# Patient Record
Sex: Male | Born: 1975
Health system: Southern US, Community
[De-identification: ages and names within clinical notes are randomized; demographics above are authoritative.]

## PROBLEM LIST (undated history)

## (undated) DIAGNOSIS — F419 Anxiety disorder, unspecified: Secondary | ICD-10-CM

## (undated) DIAGNOSIS — F79 Unspecified intellectual disabilities: Secondary | ICD-10-CM

## (undated) DIAGNOSIS — F319 Bipolar disorder, unspecified: Secondary | ICD-10-CM

## (undated) DIAGNOSIS — F209 Schizophrenia, unspecified: Secondary | ICD-10-CM

---

## 2009-06-05 ENCOUNTER — Ambulatory Visit: Payer: Self-pay | Admitting: Family Medicine

## 2009-06-05 ENCOUNTER — Observation Stay (HOSPITAL_COMMUNITY): Admission: EM | Admit: 2009-06-05 | Discharge: 2009-06-06 | Payer: Self-pay | Admitting: Emergency Medicine

## 2010-04-20 LAB — COMPREHENSIVE METABOLIC PANEL
AST: 39 U/L — ABNORMAL HIGH (ref 0–37)
CO2: 25 mEq/L (ref 19–32)
Calcium: 8.1 mg/dL — ABNORMAL LOW (ref 8.4–10.5)
Creatinine, Ser: 1.13 mg/dL (ref 0.4–1.5)
GFR calc Af Amer: >60 mL/min (ref >60)
GFR calc non Af Amer: >60 mL/min (ref >60)
Sodium: 141 mEq/L (ref 135–145)
Total Protein: 5.8 g/dL — ABNORMAL LOW (ref 6.0–8.3)

## 2010-04-20 LAB — POCT I-STAT, CHEM 8
BUN: 13 mg/dL (ref 6–23)
Calcium, Ion: 1.09 mmol/L — ABNORMAL LOW (ref 1.12–1.32)
Creatinine, Ser: 1.2 mg/dL (ref 0.4–1.5)
Glucose, Bld: 100 mg/dL — ABNORMAL HIGH (ref 70–99)
Hemoglobin: 13.9 g/dL (ref 13.0–17.0)
TCO2: 26 mmol/L (ref 0–100)

## 2010-04-20 LAB — CBC
MCHC: 34.3 g/dL (ref 30.0–36.0)
MCV: 83.5 fL (ref 78.0–100.0)
Platelets: 161 10*3/uL (ref 150–400)
RDW: 13.1 % (ref 11.5–15.5)

## 2010-04-20 LAB — DIFFERENTIAL
Eosinophils Relative: 0 % (ref 0–5)
Lymphocytes Relative: 17 % (ref 12–46)
Lymphs Abs: 0.8 10*3/uL (ref 0.7–4.0)
Monocytes Relative: 13 % — ABNORMAL HIGH (ref 3–12)

## 2011-08-15 ENCOUNTER — Encounter (HOSPITAL_COMMUNITY): Payer: Self-pay | Admitting: *Deleted

## 2011-08-15 ENCOUNTER — Emergency Department (HOSPITAL_COMMUNITY)
Admission: EM | Admit: 2011-08-15 | Discharge: 2011-08-15 | Payer: Medicaid Other | Attending: Emergency Medicine | Admitting: Emergency Medicine

## 2011-08-15 DIAGNOSIS — F411 Generalized anxiety disorder: Secondary | ICD-10-CM | POA: Insufficient documentation

## 2011-08-15 DIAGNOSIS — Z79899 Other long term (current) drug therapy: Secondary | ICD-10-CM | POA: Insufficient documentation

## 2011-08-15 DIAGNOSIS — F319 Bipolar disorder, unspecified: Secondary | ICD-10-CM | POA: Insufficient documentation

## 2011-08-15 HISTORY — DX: Anxiety disorder, unspecified: F41.9

## 2011-08-15 HISTORY — DX: Unspecified intellectual disabilities: F79

## 2011-08-15 HISTORY — DX: Schizophrenia, unspecified: F20.9

## 2011-08-15 HISTORY — DX: Bipolar disorder, unspecified: F31.9

## 2011-08-15 MED ORDER — BENZTROPINE MESYLATE 1 MG/ML IJ SOLN
2.0000 mg | Freq: Once | INTRAMUSCULAR | Status: AC
  Administered 2011-08-15: 2 mg via INTRAMUSCULAR
  Filled 2011-08-15: qty 2

## 2011-08-15 MED ORDER — FLUPHENAZINE HCL 2.5 MG/ML IJ SOLN
5.0000 mg | Freq: Once | INTRAMUSCULAR | Status: AC
  Administered 2011-08-15: 5 mg via INTRAMUSCULAR
  Filled 2011-08-15: qty 2

## 2011-08-15 NOTE — ED Provider Notes (Signed)
History     CSN: [id number]  Arrival date & time 08/15/11  4098   First MD Initiated Contact with Patient 08/15/11 2022      Chief Complaint  Patient presents with  . Anxiety    (Consider location/radiation/quality/duration/timing/severity/associated sxs/prior treatment) HPI Comments: GALAN GHEE is a 36 y.o. Male who is here  because he is having episodes of shaking for several weeks. These occur every Monday, and Tuesday, but not at other times during the week. His caregiver feels that he is more anxious than usual. She wants him assessed by a psychiatrist or admited to a Behavioral Health facility. His psychiatrist is Dr. Ladona Ridgel at [facility]. He's been eating well. There's no recent fever, chills, nausea, vomiting, or noted change in bowel and urinary habits. He is reportedly taking all his medicines as prescribed. There are no exacerbating or palliative factors.  Patient is a 36 y.o. male presenting with anxiety. The history is provided by the patient.  Anxiety    Past Medical History  Diagnosis Date  . Bipolar 1 disorder   . Schizophrenia   . Mental retardation   . Anxiety     History reviewed. No pertinent past surgical history.  No family history on file.  History  Substance Use Topics  . Smoking status: Not on file  . Smokeless tobacco: Not on file  . Alcohol Use:       Review of Systems  All other systems reviewed and are negative.    Allergies  Review of patient's allergies indicates no known allergies.  Home Medications   Current Outpatient Rx  Name Route Sig Dispense Refill  . CLOZAPINE 100 MG PO TABS Oral Take 100 mg by mouth 2 (two) times daily.    Marland Kitchen FLUOXETINE HCL 20 MG PO CAPS Oral Take 20 mg by mouth daily.    Marland Kitchen FLUPHENAZINE HCL 5 MG PO TABS Oral Take 2.5 mg by mouth 2 (two) times daily.    . ADULT MULTIVITAMIN W/MINERALS CH Oral Take 1 tablet by mouth daily.    . STRESS FORMULA PLUS IRON PO Oral Take 1 tablet by mouth daily.    Marland Kitchen  RANITIDINE HCL 150 MG PO TABS Oral Take 150 mg by mouth at bedtime.      BP 117/67  Pulse 78  Temp 98.7 F (37.1 C) (Oral)  Resp 18  SpO2 100%  Physical Exam  Nursing note and vitals reviewed. Constitutional: He appears well-developed and well-nourished.  HENT:  Head: Normocephalic and atraumatic.  Right Ear: External ear normal.  Left Ear: External ear normal.  Eyes: Conjunctivae and EOM are normal. Pupils are equal, round, and reactive to light.  Neck: Normal range of motion and phonation normal. Neck supple.  Cardiovascular: Normal rate, regular rhythm, normal heart sounds and intact distal pulses.   Pulmonary/Chest: Effort normal and breath sounds normal. He exhibits no bony tenderness.  Abdominal: Soft. Normal appearance. There is no tenderness.  Musculoskeletal: Normal range of motion.  Neurological: He is alert. He has normal strength. No cranial nerve deficit or sensory deficit. He exhibits normal muscle tone. Coordination normal.       He is unable to answer questions about orientation beyond, his name.  Skin: Skin is warm, dry and intact.  Psychiatric: He has a normal mood and affect. His behavior is normal.    ED Course  Procedures (including critical care time)  Tele-psychiatric consultation done: Dr. Reece Leader recommends, that the patient be treated with Prolixin and Cogentin IM as  a single dose. He has been stable for discharge with followup with his regular psychiatrist.     Labs Reviewed  URINALYSIS, ROUTINE W REFLEX MICROSCOPIC   No results found.   1. Bipolar 1 disorder       MDM  Anxiety with ongoing schizophrenia. Patient treated and improved in emergency department. Doubt metabolic instability, serious bacterial infection or impending vascular collapse; the patient is stable for discharge.  Plan: Home Medications- usual; Home Treatments- rest; Recommended follow up- Psychiatry f/u asap for medication assessment        Flint Melter,  MD 08/15/11 2302

## 2011-08-15 NOTE — ED Notes (Signed)
MD at bedside. 

## 2011-08-15 NOTE — ED Notes (Signed)
Pt brought in by EMS; pt presented with panic attack; family called saying pt was having a seizure; on arrival pt in a "dazed" state since about 4pm.  Per EMS family states this is not normal. Pt alert and oriented x 4.  Per Child psychotherapist on scene this happens 3-4 times per wk.  Ambulated on arrival to ER

## 2011-08-15 NOTE — ED Notes (Addendum)
Director of Group Home, Ms. Roddie Mc with [facility], states that patient is not only anxious, but appears paranoid, "stares into space at times."  She feels as though he should be assessed for admission to a behavioral health/pscyh.  Director states the frequency has increased and was noted after change in patient's medication dosage for his schizophrenia. Currently patient appears anxious, noted to stare into space, denies any hallucinations or delusions at this time.

## 2011-08-15 NOTE — ED Notes (Signed)
Tele-psych call/fax. Tele-psych MD assessing pt

## 2011-08-15 NOTE — ED Notes (Signed)
[id number]<AV> Expected date:08/15/11<BR> Expected time: 7:11 PM<BR> Means of arrival:Ambulance<BR> Comments:<BR> Panic attack

## 2015-03-18 ENCOUNTER — Encounter (HOSPITAL_COMMUNITY): Payer: Self-pay | Admitting: *Deleted

## 2015-03-18 ENCOUNTER — Emergency Department (INDEPENDENT_AMBULATORY_CARE_PROVIDER_SITE_OTHER)
Admission: EM | Admit: 2015-03-18 | Discharge: 2015-03-18 | Disposition: A | Discharge status: Home / Self Care | Payer: Medicare Other | Source: Home / Self Care | Attending: Family Medicine | Admitting: Family Medicine

## 2015-03-18 DIAGNOSIS — K5901 Slow transit constipation: Secondary | ICD-10-CM | POA: Diagnosis not present

## 2015-03-18 DIAGNOSIS — F5089 Other specified eating disorder: Secondary | ICD-10-CM

## 2015-03-18 MED ORDER — ONDANSETRON 4 MG PO TBDP
ORAL_TABLET | ORAL | Status: AC
  Filled 2015-03-18: qty 1

## 2015-03-18 MED ORDER — ONDANSETRON 4 MG PO TBDP
4.0000 mg | ORAL_TABLET | Freq: Once | ORAL | Status: AC
  Administered 2015-03-18: 4 mg via ORAL

## 2015-03-18 MED ORDER — ONDANSETRON HCL 4 MG PO TABS: 4.0000 mg | ORAL_TABLET | Freq: Four times a day (QID) | ORAL | Status: DC

## 2015-03-18 MED ORDER — POLYETHYLENE GLYCOL 3350 17 G PO PACK
17.0000 g | PACK | Freq: Every day | ORAL | Status: DC
Start: 2015-03-18 — End: 2019-09-08

## 2015-03-18 NOTE — ED Notes (Signed)
Pt  Reports   Symptoms    Of     Nausea  Vomiting         And    Some  Constipation           Developed  Last  Pm        Pt  Resides  In a  Group home

## 2015-03-18 NOTE — ED Provider Notes (Signed)
CSN: [id number]     Arrival date & time 03/18/15  1317 History   First MD Initiated Contact with Patient 03/18/15 1433     Chief Complaint  Patient presents with  . Emesis   (Consider location/radiation/quality/duration/timing/severity/associated sxs/prior Treatment) Patient is a 40 y.o. male presenting with vomiting. The history is provided by the patient and a caregiver.  Emesis Severity:  Mild Duration:  6 hours Number of daily episodes:  1 Quality:  Stomach contents Progression:  Improving Chronicity:  New Relieved by:  None tried Worsened by:  Nothing tried Ineffective treatments:  None tried Associated symptoms: no abdominal pain and no diarrhea     Past Medical History  Diagnosis Date  . Bipolar 1 disorder (HCC)   . Schizophrenia (HCC)   . Mental retardation   . Anxiety    History reviewed. No pertinent past surgical history. History reviewed. No pertinent family history. Social History  Substance Use Topics  . Smoking status: None  . Smokeless tobacco: None  . Alcohol Use: None    Review of Systems  Constitutional: Positive for appetite change. Negative for fever.  Gastrointestinal: Positive for nausea, vomiting and constipation. Negative for abdominal pain, diarrhea and blood in stool.  All other systems reviewed and are negative.   Allergies  Review of patient's allergies indicates no known allergies.  Home Medications   Prior to Admission medications   Medication Sig Start Date End Date Taking? Authorizing Provider  cloZAPine (CLOZARIL) 100 MG tablet Take 100 mg by mouth 2 (two) times daily.    Historical Provider, MD  FLUoxetine (PROZAC) 20 MG capsule Take 20 mg by mouth daily.    Historical Provider, MD  fluPHENAZine (PROLIXIN) 5 MG tablet Take 2.5 mg by mouth 2 (two) times daily.    Historical Provider, MD  Multiple Vitamin (MULTIVITAMIN WITH MINERALS) TABS Take 1 tablet by mouth daily.    Historical Provider, MD  Multiple Vitamins-Iron (STRESS  FORMULA PLUS IRON PO) Take 1 tablet by mouth daily.    Historical Provider, MD  ondansetron (ZOFRAN) 4 MG tablet Take 1 tablet (4 mg total) by mouth every 6 (six) hours. Prn n/v. 03/18/15   Linna Hoff, MD  polyethylene glycol [facility] Ethelene Hal) packet Take 17 g by mouth daily. 03/18/15   Linna Hoff, MD  ranitidine (ZANTAC) 150 MG tablet Take 150 mg by mouth at bedtime.    Historical Provider, MD   Meds Ordered and Administered this Visit   Medications  ondansetron (ZOFRAN-ODT) disintegrating tablet 4 mg (not administered)    BP 117/69 mmHg  Pulse 114  Temp(Src) 99.7 F (37.6 C) (Oral)  Resp 16  SpO2 98% No data found.   Physical Exam  Constitutional: He appears well-developed and well-nourished. No distress.  Abdominal: Soft. Bowel sounds are normal. He exhibits no mass. There is no tenderness. There is no rebound and no guarding.  Neurological: He is alert.  Skin: Skin is warm and dry.  Nursing note and vitals reviewed.   ED Course  Procedures (including critical care time)  Labs Review Labs Reviewed - No data to display  Imaging Review No results found.   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 Slow transit constipation  Linna Hoff, MD 03/18/15 [phone]

## 2016-01-06 ENCOUNTER — Encounter (HOSPITAL_COMMUNITY): Payer: Self-pay | Admitting: *Deleted

## 2016-01-06 ENCOUNTER — Emergency Department (HOSPITAL_COMMUNITY)
Admission: EM | Admit: 2016-01-06 | Discharge: 2016-01-06 | Disposition: A | Discharge status: Home / Self Care | Payer: Medicare Other | Attending: Emergency Medicine | Admitting: Emergency Medicine

## 2016-01-06 DIAGNOSIS — R197 Diarrhea, unspecified: Secondary | ICD-10-CM | POA: Diagnosis not present

## 2016-01-06 DIAGNOSIS — R112 Nausea with vomiting, unspecified: Secondary | ICD-10-CM | POA: Diagnosis present

## 2016-01-06 DIAGNOSIS — E860 Dehydration: Secondary | ICD-10-CM | POA: Insufficient documentation

## 2016-01-06 LAB — COMPREHENSIVE METABOLIC PANEL
ALK PHOS: 80 U/L (ref 38–126)
ALT: 24 U/L (ref 17–63)
ANION GAP: 10 (ref 5–15)
AST: 28 U/L (ref 15–41)
Albumin: 4.7 g/dL (ref 3.5–5.0)
BUN: 18 mg/dL (ref 6–20)
CALCIUM: 10 mg/dL (ref 8.9–10.3)
CO2: 27 mmol/L (ref 22–32)
CREATININE: 2.32 mg/dL — AB (ref 0.61–1.24)
Chloride: 100 mmol/L — ABNORMAL LOW (ref 101–111)
GFR, EST AFRICAN AMERICAN: 39 mL/min — AB (ref >60)
GFR, EST NON AFRICAN AMERICAN: 34 mL/min — AB (ref >60)
Glucose, Bld: 129 mg/dL — ABNORMAL HIGH (ref 65–99)
Potassium: 4.1 mmol/L (ref 3.5–5.1)
SODIUM: 137 mmol/L (ref 135–145)
Total Bilirubin: 0.8 mg/dL (ref 0.3–1.2)
Total Protein: 8.4 g/dL — ABNORMAL HIGH (ref 6.5–8.1)

## 2016-01-06 LAB — URINALYSIS, ROUTINE W REFLEX MICROSCOPIC
Bilirubin Urine: NEGATIVE
GLUCOSE, UA: NEGATIVE mg/dL
HGB URINE DIPSTICK: NEGATIVE
KETONES UR: NEGATIVE mg/dL
LEUKOCYTES UA: NEGATIVE
NITRITE: NEGATIVE
Protein, ur: 100 mg/dL — AB
SPECIFIC GRAVITY, URINE: 1.025 (ref 1.005–1.030)
pH: 5 (ref 5.0–8.0)

## 2016-01-06 LAB — I-STAT CHEM 8, ED
BUN: 18 mg/dL (ref 6–20)
CALCIUM ION: 1.07 mmol/L — AB (ref 1.15–1.40)
CREATININE: 1.7 mg/dL — AB (ref 0.61–1.24)
Chloride: 103 mmol/L (ref 101–111)
GLUCOSE: 106 mg/dL — AB (ref 65–99)
HEMATOCRIT: 33 % — AB (ref 39.0–52.0)
HEMOGLOBIN: 11.2 g/dL — AB (ref 13.0–17.0)
Potassium: 3.7 mmol/L (ref 3.5–5.1)
Sodium: 142 mmol/L (ref 135–145)
TCO2: 26 mmol/L (ref 0–100)

## 2016-01-06 LAB — CBC
HCT: 43.1 % (ref 39.0–52.0)
HEMOGLOBIN: 14.7 g/dL (ref 13.0–17.0)
MCH: 28.4 pg (ref 26.0–34.0)
MCHC: 34.1 g/dL (ref 30.0–36.0)
MCV: 83.4 fL (ref 78.0–100.0)
PLATELETS: 213 10*3/uL (ref 150–400)
RBC: 5.17 MIL/uL (ref 4.22–5.81)
RDW: 13.4 % (ref 11.5–15.5)
WBC: 7.9 10*3/uL (ref 4.0–10.5)

## 2016-01-06 LAB — LIPASE, BLOOD: LIPASE: 13 U/L (ref 11–51)

## 2016-01-06 MED ORDER — ONDANSETRON 4 MG PO TBDP: 4.0000 mg | ORAL_TABLET | Freq: Three times a day (TID) | ORAL | 0 refills | Status: DC | PRN

## 2016-01-06 MED ORDER — SODIUM CHLORIDE 0.9 % IV BOLUS (SEPSIS)
1000.0000 mL | Freq: Once | INTRAVENOUS | Status: AC
  Administered 2016-01-06: 1000 mL via INTRAVENOUS

## 2016-01-06 MED ORDER — SODIUM CHLORIDE 0.9 % IV BOLUS (SEPSIS)
1000.0000 mL | Freq: Once | INTRAVENOUS | Status: AC
Start: 2016-01-06 — End: 2016-01-06
  Administered 2016-01-06: 1000 mL via INTRAVENOUS

## 2016-01-06 MED ORDER — ONDANSETRON HCL 4 MG/2ML IJ SOLN
4.0000 mg | Freq: Once | INTRAMUSCULAR | Status: AC
  Administered 2016-01-06: 4 mg via INTRAVENOUS
  Filled 2016-01-06: qty 2

## 2016-01-06 NOTE — ED Triage Notes (Signed)
Pt having n/v/d and feeling lightheaded since 0300. Denies having any pain. HR 133 at triage.

## 2016-01-06 NOTE — ED Notes (Signed)
Contact caregiver, Ms. Lewis, at [phone] (cell) or [phone]

## 2016-01-06 NOTE — ED Notes (Signed)
Contacted caregiver to inform pt will be discharged. Caregiver en route to transport pt home.

## 2016-01-06 NOTE — Discharge Instructions (Addendum)
Take the prescribed medication as directed.  Make sure to drink fluids to stay hydrated. Follow-up with your primary care doctor.  You will need your kidney function rechecked within the next few days. Return to the ED for new or worsening symptoms.

## 2016-01-06 NOTE — ED Notes (Signed)
Patient undressed, in gown, on continuous pulse oximetry and blood pressure cuff; visitor at bedside 

## 2016-01-06 NOTE — ED Notes (Signed)
Pt ambulatory w/ steady gait to restroom. 

## 2016-01-06 NOTE — ED Provider Notes (Signed)
[facility]-EMERGENCY DEPT Provider Note   CSN: [id number] Arrival date & time: 01/06/16  0857     History   Chief Complaint Chief Complaint  Patient presents with  . Emesis  . Dizziness    HPI Brian Stafford is a 40 y.o. male.  The history is provided by the patient and medical records.    40 year old male with history of anxiety, bipolar disorder, mental retardation, schizophrenia, presenting to the ED for nausea, vomiting, diarrhea. This began around 3 AM this morning.  Mother reports he was complaining of some nausea yesterday when he got home from his day program, but was able to eat dinner normally.  Mom reports he had a few episodes of watery, non-bloody, diarrhea when he woke up at 3am but went back to sleep for a little while.  He woke back up around 5am and started having Nonbloody, nonbilious emesis. No reported fever. Patient denies any abdominal pain. There is not been any sick contacts at home, mother is unsure if anyone at the day program has been ill. No prior abdominal surgeries. No significant GI history. No meds given PTA for symptoms.  Past Medical History:  Diagnosis Date  . Anxiety   . Bipolar 1 disorder (HCC)   . Mental retardation   . Schizophrenia (HCC)     There are no active problems to display for this patient.   History reviewed. No pertinent surgical history.     Home Medications    Prior to Admission medications   Medication Sig Start Date End Date Taking? Authorizing Provider  cloZAPine (CLOZARIL) 100 MG tablet Take 100 mg by mouth 2 (two) times daily.    Historical Provider, MD  FLUoxetine (PROZAC) 20 MG capsule Take 20 mg by mouth daily.    Historical Provider, MD  fluPHENAZine (PROLIXIN) 5 MG tablet Take 2.5 mg by mouth 2 (two) times daily.    Historical Provider, MD  Multiple Vitamin (MULTIVITAMIN WITH MINERALS) TABS Take 1 tablet by mouth daily.    Historical Provider, MD  Multiple Vitamins-Iron (STRESS FORMULA PLUS IRON PO) Take 1  tablet by mouth daily.    Historical Provider, MD  ondansetron (ZOFRAN) 4 MG tablet Take 1 tablet (4 mg total) by mouth every 6 (six) hours. Prn n/v. 03/18/15   Linna HoffJames D Kindl, MD  polyethylene glycol [facility] / Ethelene HalGLYCOLAX) packet Take 17 g by mouth daily. 03/18/15   Linna HoffJames D Kindl, MD  ranitidine (ZANTAC) 150 MG tablet Take 150 mg by mouth at bedtime.    Historical Provider, MD    Family History History reviewed. No pertinent family history.  Social History Social History  Substance Use Topics  . Smoking status: Not on file  . Smokeless tobacco: Not on file  . Alcohol use Not on file     Allergies   Patient has no known allergies.   Review of Systems Review of Systems  Gastrointestinal: Positive for diarrhea, nausea and vomiting.  All other systems reviewed and are negative.    Physical Exam Updated Vital Signs BP 128/76 (BP Location: Right Arm)   Pulse 117   Temp 98.3 F (36.8 C) (Oral)   Resp 18   SpO2 100%   Physical Exam  Constitutional: He is oriented to person, place, and time. He appears well-developed and well-nourished.  HENT:  Head: Normocephalic and atraumatic.  Mouth/Throat: Oropharynx is clear and moist.  Mildly dry mucous membranes  Eyes: Conjunctivae and EOM are normal. Pupils are equal, round, and reactive to light.  Neck: Normal range of motion.  Cardiovascular: Regular rhythm and normal heart sounds.  Tachycardia present.   Tachycardic around 115-120 during exam  Pulmonary/Chest: Effort normal and breath sounds normal. No respiratory distress. He has no wheezes.  Abdominal: Soft. Bowel sounds are normal. There is no tenderness.  Soft, benign, non-tender, normal bowel sounds, no distention  Musculoskeletal: Normal range of motion.  Neurological: He is alert and oriented to person, place, and time.  Skin: Skin is warm and dry.  Psychiatric: He has a normal mood and affect.  Nursing note and vitals reviewed.    ED Treatments / Results  Labs (all  labs ordered are listed, but only abnormal results are displayed) Labs Reviewed  COMPREHENSIVE METABOLIC PANEL - Abnormal; Notable for the following:       Result Value   Chloride 100 (*)    Glucose, Bld 129 (*)    Creatinine, Ser 2.32 (*)    Total Protein 8.4 (*)    GFR calc non Af Amer 34 (*)    GFR calc Af Amer 39 (*)    All other components within normal limits  URINALYSIS, ROUTINE W REFLEX MICROSCOPIC - Abnormal; Notable for the following:    Color, Urine AMBER (*)    APPearance HAZY (*)    Protein, ur 100 (*)    Bacteria, UA RARE (*)    Squamous Epithelial / LPF 0-5 (*)    All other components within normal limits  I-STAT CHEM 8, ED - Abnormal; Notable for the following:    Creatinine, Ser 1.70 (*)    Glucose, Bld 106 (*)    Calcium, Ion 1.07 (*)    Hemoglobin 11.2 (*)    HCT 33.0 (*)    All other components within normal limits  LIPASE, BLOOD  CBC    EKG  EKG Interpretation None       Radiology No results found.  Procedures Procedures (including critical care time)  Medications Ordered in ED Medications  sodium chloride 0.9 % bolus 1,000 mL (0 mLs Intravenous Stopped 01/06/16 1108)  ondansetron (ZOFRAN) injection 4 mg (4 mg Intravenous Given 01/06/16 0956)  sodium chloride 0.9 % bolus 1,000 mL (0 mLs Intravenous Stopped 01/06/16 1400)     Initial Impression / Assessment and Plan / ED Course  I have reviewed the triage vital signs and the nursing notes.  Pertinent labs & imaging results that were available during my care of the patient were reviewed by me and considered in my medical decision making (see chart for details).  Clinical Course    40 year old male here with nausea, vomiting, and diarrhea which began earlier this morning. He is afebrile and nontoxic. He is tachycardic which I suspect is likely secondary to dehydration.  Abdomen is soft, benign.  Patient denies any pain.   Labs with elevated serum creatinine 2.32, no prior values for comparison.   BUN WNL.  No leukocytosis.  Patient was aggressively fluid resuscitated here with improvement of his serum creatinine to 1.70.  I suspect this will continue to trend down over the next few days.  HR better controlled with fluids.  Patient has been drinking fluids here without difficulty.   Discussed with caregiver to continue to push oral fluids.  Follow-up with PCP for re-check of renal function within the next few days.  Return precautions given for any new/worsening symptoms.  Final Clinical Impressions(s) / ED Diagnoses   Final diagnoses:  Nausea vomiting and diarrhea  Dehydration    New Prescriptions New  Prescriptions   ONDANSETRON (ZOFRAN ODT) 4 MG DISINTEGRATING TABLET    Take 1 tablet (4 mg total) by mouth every 8 (eight) hours as needed for nausea.   ONDANSETRON (ZOFRAN ODT) 4 MG DISINTEGRATING TABLET    Take 1 tablet (4 mg total) by mouth every 8 (eight) hours as needed for nausea.     Garlon Hatchet, PA-C 01/06/16 1448    Laurence Spates, MD 01/06/16 [phone]

## 2016-01-06 NOTE — ED Notes (Signed)
Pt given water for PO challenge, okay per Roscoe[place]isa, [place]

## 2016-01-06 NOTE — ED Notes (Signed)
Patient d/c'd from IV, monitor, continuous pulse oximetry and blood pressure cuff; patient getting dressed to be discharged home 

## 2016-01-06 NOTE — ED Notes (Signed)
Patient placed back on monitor, continuous pulse oximetry and blood pressure cuff; visitor at  bedside 

## 2016-01-06 NOTE — ED Notes (Signed)
Pt tolerated PO challenge w/o any difficulties.

## 2018-09-05 ENCOUNTER — Other Ambulatory Visit: Payer: Self-pay

## 2018-09-05 DIAGNOSIS — Z20822 Contact with and (suspected) exposure to covid-19: Secondary | ICD-10-CM

## 2018-09-06 LAB — NOVEL CORONAVIRUS, NAA: SARS-CoV-2, NAA: NOT DETECTED

## 2018-10-11 ENCOUNTER — Other Ambulatory Visit: Payer: Self-pay

## 2018-10-11 DIAGNOSIS — Z20822 Contact with and (suspected) exposure to covid-19: Secondary | ICD-10-CM

## 2018-10-12 LAB — NOVEL CORONAVIRUS, NAA: SARS-CoV-2, NAA: NOT DETECTED

## 2018-10-29 ENCOUNTER — Other Ambulatory Visit: Payer: Self-pay

## 2018-10-29 DIAGNOSIS — Z20822 Contact with and (suspected) exposure to covid-19: Secondary | ICD-10-CM

## 2018-10-30 LAB — NOVEL CORONAVIRUS, NAA: SARS-CoV-2, NAA: NOT DETECTED

## 2018-11-21 ENCOUNTER — Other Ambulatory Visit: Payer: Self-pay

## 2018-11-21 DIAGNOSIS — Z20822 Contact with and (suspected) exposure to covid-19: Secondary | ICD-10-CM

## 2018-11-23 LAB — NOVEL CORONAVIRUS, NAA: SARS-CoV-2, NAA: NOT DETECTED

## 2018-12-21 ENCOUNTER — Other Ambulatory Visit: Payer: Self-pay

## 2018-12-21 DIAGNOSIS — Z20822 Contact with and (suspected) exposure to covid-19: Secondary | ICD-10-CM

## 2018-12-24 LAB — NOVEL CORONAVIRUS, NAA: SARS-CoV-2, NAA: NOT DETECTED

## 2019-09-07 ENCOUNTER — Emergency Department (HOSPITAL_COMMUNITY)
Admission: EM | Admit: 2019-09-07 | Discharge: 2019-09-09 | Disposition: A | Discharge status: Home / Self Care | Payer: 59 | Attending: Emergency Medicine | Admitting: Emergency Medicine

## 2019-09-07 ENCOUNTER — Other Ambulatory Visit: Payer: Self-pay

## 2019-09-07 ENCOUNTER — Encounter (HOSPITAL_COMMUNITY): Payer: Self-pay

## 2019-09-07 DIAGNOSIS — F250 Schizoaffective disorder, bipolar type: Secondary | ICD-10-CM | POA: Insufficient documentation

## 2019-09-07 DIAGNOSIS — Z20822 Contact with and (suspected) exposure to covid-19: Secondary | ICD-10-CM | POA: Diagnosis not present

## 2019-09-07 DIAGNOSIS — R4689 Other symptoms and signs involving appearance and behavior: Secondary | ICD-10-CM

## 2019-09-07 DIAGNOSIS — F209 Schizophrenia, unspecified: Secondary | ICD-10-CM

## 2019-09-07 DIAGNOSIS — F70 Mild intellectual disabilities: Secondary | ICD-10-CM | POA: Diagnosis not present

## 2019-09-07 DIAGNOSIS — Z79899 Other long term (current) drug therapy: Secondary | ICD-10-CM | POA: Diagnosis not present

## 2019-09-07 DIAGNOSIS — F203 Undifferentiated schizophrenia: Secondary | ICD-10-CM

## 2019-09-07 DIAGNOSIS — R456 Violent behavior: Secondary | ICD-10-CM | POA: Diagnosis present

## 2019-09-07 DIAGNOSIS — R443 Hallucinations, unspecified: Secondary | ICD-10-CM

## 2019-09-07 LAB — RAPID URINE DRUG SCREEN, HOSP PERFORMED
Amphetamines: NOT DETECTED
Barbiturates: NOT DETECTED
Benzodiazepines: NOT DETECTED
Cocaine: NOT DETECTED
Opiates: NOT DETECTED
Tetrahydrocannabinol: NOT DETECTED

## 2019-09-07 LAB — ETHANOL: Alcohol, Ethyl (B): <10 mg/dL (ref <10)

## 2019-09-07 LAB — CBC
HCT: 37.5 % — ABNORMAL LOW (ref 39.0–52.0)
Hemoglobin: 12.1 g/dL — ABNORMAL LOW (ref 13.0–17.0)
MCH: 28.3 pg (ref 26.0–34.0)
MCHC: 32.3 g/dL (ref 30.0–36.0)
MCV: 87.8 fL (ref 80.0–100.0)
Platelets: 206 10*3/uL (ref 150–400)
RBC: 4.27 MIL/uL (ref 4.22–5.81)
RDW: 13.1 % (ref 11.5–15.5)
WBC: 6 10*3/uL (ref 4.0–10.5)
nRBC: 0 % (ref 0.0–0.2)

## 2019-09-07 LAB — ACETAMINOPHEN LEVEL: Acetaminophen (Tylenol), Serum: <10 ug/mL — ABNORMAL LOW (ref 10–30)

## 2019-09-07 LAB — BASIC METABOLIC PANEL
Anion gap: 8 (ref 5–15)
BUN: 12 mg/dL (ref 6–20)
CO2: 26 mmol/L (ref 22–32)
Calcium: 8.8 mg/dL — ABNORMAL LOW (ref 8.9–10.3)
Chloride: 107 mmol/L (ref 98–111)
Creatinine, Ser: 1.24 mg/dL (ref 0.61–1.24)
GFR calc Af Amer: >60 mL/min (ref >60)
GFR calc non Af Amer: >60 mL/min (ref >60)
Glucose, Bld: 85 mg/dL (ref 70–99)
Potassium: 3.4 mmol/L — ABNORMAL LOW (ref 3.5–5.1)
Sodium: 141 mmol/L (ref 135–145)

## 2019-09-07 LAB — SALICYLATE LEVEL: Salicylate Lvl: <7 mg/dL — ABNORMAL LOW (ref 7.0–30.0)

## 2019-09-07 NOTE — ED Notes (Signed)
Patient fondiling genital area while vitals signs being taken, was told by [facility] that patient has masturbation problem.
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
## 2019-09-07 NOTE — ED Provider Notes (Signed)
[facility]-EMERGENCY DEPT Provider Note   CSN: [id number] Arrival date & time: 09/07/19  2012     History Chief Complaint  Patient presents with  . Aggressive Behavior    Brian Stafford is a 44 y.o. male presenting for evaluation of aggressive behavior.  Patient brought in under IVC due to aggressive behavior.  Per IVC paperwork, patient was aggressive towards group home staff.  He is having auditory and visual hallucinations.  Per staff, he is not eating or sleeping properly.  Patient states he has chronic hallucinations, this has been going on for years.  He does not feel threatened by them.  They do not tell him to hurt himself or anyone else.  There is been no change in his hallucinations.  He does not verbalize what happened at the group home today.  He states he is taking his medications as prescribed.  He denies pain, fever, cough, urinary symptoms.  Additional history taken chart review.  Patient with a history of anxiety, bipolar, MR, and schizophrenia.  HPI     Past Medical History:  Diagnosis Date  . Anxiety   . Bipolar 1 disorder (HCC)   . Mental retardation   . Schizophrenia (HCC)     There are no problems to display for this patient.   History reviewed. No pertinent surgical history.     History reviewed. No pertinent family history.  Social History   Tobacco Use  . Smoking status: Not on file  Substance Use Topics  . Alcohol use: Not on file  . Drug use: Not on file    Home Medications Prior to Admission medications   Medication Sig Start Date End Date Taking? Authorizing Provider  benztropine (COGENTIN) 1 MG tablet Take 1 mg by mouth at bedtime.    [provider]  cloZAPine (CLOZARIL) 100 MG tablet Take 200-400 mg by mouth See admin instructions. Pt takes 200mg  in am and 400mg  at bedtime    [provider]  divalproex (DEPAKOTE ER) 500 MG 24 hr tablet Take 500 mg by mouth 2 (two) times daily.     [provider]  FLUoxetine (PROZAC) 20 MG capsule Take 40 mg by mouth daily.     [provider]  fluPHENAZine (PROLIXIN) 5 MG tablet Take 2.5 mg by mouth at bedtime.     [provider]  LORazepam (ATIVAN) 0.5 MG tablet Take 0.5 mg by mouth at bedtime.    [provider]  methylphenidate (RITALIN) 20 MG tablet Take 20 mg by mouth every morning.    [provider]  Multiple Vitamin (MULTIVITAMIN WITH MINERALS) TABS Take 1 tablet by mouth daily.    [provider]  Multiple Vitamins-Iron (STRESS FORMULA PLUS IRON PO) Take 1 tablet by mouth daily.    [provider]  ondansetron (ZOFRAN ODT) 4 MG disintegrating tablet Take 1 tablet (4 mg total) by mouth every 8 (eight) hours as needed for nausea. 01/06/16   , PA-C  ondansetron (ZOFRAN ODT) 4 MG disintegrating tablet Take 1 tablet (4 mg total) by mouth every 8 (eight) hours as needed for nausea. 01/06/16   Garlon Hatchet, PA-C  polyethylene glycol [facility] / GLYCOLAX) packet Take 17 g by mouth daily. 03/18/15   [facility], MD  propranolol (INDERAL) 40 MG tablet Take 40 mg by mouth daily.    [provider]  ranitidine (ZANTAC) 150 MG tablet Take 150 mg by mouth 2 (two) times daily.  [provider]    Allergies    Patient has no known allergies.  Review of Systems   Review of Systems  Unable to perform ROS: Psychiatric disorder  Psychiatric/Behavioral: Positive for behavioral problems, hallucinations and sleep disturbance.    Physical Exam Updated Vital Signs BP 121/73 (BP Location: Right Arm)   Pulse (!) 101   Temp 98.1 F (36.7 C) (Oral)   Resp 16   Ht 6\' 3"  (1.905 m)   Wt 88.5 kg   SpO2 99%   BMI 24.37 kg/m   Physical Exam Vitals and nursing note reviewed.  Constitutional:      General: He is not in acute distress.    Appearance: He is well-developed.     Comments: Resting in the bed in no acute distress  HENT:     Head:  Normocephalic and atraumatic.  Eyes:     Conjunctiva/sclera: Conjunctivae normal.     Pupils: Pupils are equal, round, and reactive to light.  Cardiovascular:     Rate and Rhythm: Normal rate and regular rhythm.     Pulses: Normal pulses.  Pulmonary:     Effort: Pulmonary effort is normal. No respiratory distress.     Breath sounds: Normal breath sounds. No wheezing.  Abdominal:     General: There is no distension.     Palpations: Abdomen is soft. There is no mass.     Tenderness: There is no abdominal tenderness. There is no guarding or rebound.  Musculoskeletal:        General: Normal range of motion.     Cervical back: Normal range of motion and neck supple.  Skin:    General: Skin is warm and dry.     Capillary Refill: Capillary refill takes less than 2 seconds.  Neurological:     Mental Status: He is alert and oriented to person, place, and time.  Psychiatric:        Attention and Perception: He perceives auditory and visual hallucinations.        Mood and Affect: Affect is labile.        Speech: He is noncommunicative.        Behavior: Behavior is withdrawn.        Thought Content: Thought content does not include homicidal or suicidal ideation. Thought content does not include homicidal or suicidal plan.     Comments: Calm and cooperative.  Denies HI or SI.  Reports auditory and visual hallucinations, at baseline.  Withdrawn behavior.  Labile affect     ED Results / Procedures / Treatments   Labs (all labs ordered are listed, but only abnormal results are displayed) Labs Reviewed  CBC - Abnormal; Notable for the following components:      Result Value   Hemoglobin 12.1 (*)    HCT 37.5 (*)    All other components within normal limits  BASIC METABOLIC PANEL - Abnormal; Notable for the following components:   Potassium 3.4 (*)    Calcium 8.8 (*)    All other components within normal limits  SARS CORONAVIRUS 2 BY RT PCR (HOSPITAL ORDER, PERFORMED IN [place]  [facility])  RAPID URINE DRUG SCREEN, HOSP PERFORMED  ETHANOL  SALICYLATE LEVEL  ACETAMINOPHEN LEVEL    EKG EKG Interpretation  Date/Time:  Saturday September 07 2019 22:44:03 EDT Ventricular Rate:  94 PR Interval:  144 QRS Duration: 86 QT Interval:  366 QTC Calculation: 457 R Axis:   80 Text Interpretation: Normal sinus rhythm Normal ECG Confirmed  by Marianna Fuss ([id number]) on 09/07/2019 11:00:15 PM   Radiology No results found.  Procedures Procedures (including critical care time)  Medications Ordered in ED Medications - No data to display  ED Course  I have reviewed the triage vital signs and the nursing notes.  Pertinent labs & imaging results that were available during my care of the patient were reviewed by me and considered in my medical decision making (see chart for details).    MDM Rules/Calculators/A&P                          Patient presenting under IVC for aggressive behavior at the group home.  On exam, patient is nontoxic.  He is calm and cooperative.  Denying SI or HI.  Reports baseline auditory and visual hallucinations.  However due to change in behavior per group home staff, will consult with TTS.  Medical screening labs ordered.  Labs interpreted by me, overall reassuring.  UDS negative.  Hemoglobin stable.  Electrolytes at baseline. Pt medically cleared at this time.   The patient has been placed in psychiatric observation due to the need to provide a safe environment for the patient while obtaining psychiatric consultation and evaluation, as well as ongoing medical and medication management to treat the patient's condition.  The patient has been placed under full IVC at this time.   Final Clinical Impression(s) / ED Diagnoses Final diagnoses:  Aggressive behavior  Hallucinations    Rx / DC Orders ED Discharge Orders    None       Alveria Apley, PA-C 09/07/19 2320    Milagros Loll, MD 09/09/19 [phone]
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
## 2019-09-08 DIAGNOSIS — R4689 Other symptoms and signs involving appearance and behavior: Secondary | ICD-10-CM

## 2019-09-08 DIAGNOSIS — F203 Undifferentiated schizophrenia: Secondary | ICD-10-CM

## 2019-09-08 DIAGNOSIS — F209 Schizophrenia, unspecified: Secondary | ICD-10-CM

## 2019-09-08 LAB — CBC WITH DIFFERENTIAL/PLATELET
Abs Immature Granulocytes: 0.04 10*3/uL (ref 0.00–0.07)
Basophils Absolute: 0 10*3/uL (ref 0.0–0.1)
Basophils Relative: 0 %
Eosinophils Absolute: 0.1 10*3/uL (ref 0.0–0.5)
Eosinophils Relative: 2 %
HCT: 38 % — ABNORMAL LOW (ref 39.0–52.0)
Hemoglobin: 12.2 g/dL — ABNORMAL LOW (ref 13.0–17.0)
Immature Granulocytes: 1 %
Lymphocytes Relative: 29 %
Lymphs Abs: 1.6 10*3/uL (ref 0.7–4.0)
MCH: 28.3 pg (ref 26.0–34.0)
MCHC: 32.1 g/dL (ref 30.0–36.0)
MCV: 88.2 fL (ref 80.0–100.0)
Monocytes Absolute: 0.5 10*3/uL (ref 0.1–1.0)
Monocytes Relative: 9 %
Neutro Abs: 3.4 10*3/uL (ref 1.7–7.7)
Neutrophils Relative %: 59 %
Platelets: 217 10*3/uL (ref 150–400)
RBC: 4.31 MIL/uL (ref 4.22–5.81)
RDW: 12.9 % (ref 11.5–15.5)
WBC: 5.7 10*3/uL (ref 4.0–10.5)
nRBC: 0 % (ref 0.0–0.2)

## 2019-09-08 LAB — VALPROIC ACID LEVEL: Valproic Acid Lvl: 48 ug/mL — ABNORMAL LOW (ref 50.0–100.0)

## 2019-09-08 LAB — SARS CORONAVIRUS 2 BY RT PCR (HOSPITAL ORDER, PERFORMED IN ~~LOC~~ HOSPITAL LAB): SARS Coronavirus 2: NEGATIVE

## 2019-09-08 MED ORDER — ADULT MULTIVITAMIN W/MINERALS CH
1.0000 | ORAL_TABLET | Freq: Every day | ORAL | Status: DC
  Administered 2019-09-08 – 2019-09-09 (×2): 1 via ORAL
  Filled 2019-09-08 (×2): qty 1

## 2019-09-08 MED ORDER — DIVALPROEX SODIUM ER 500 MG PO TB24
500.0000 mg | ORAL_TABLET | Freq: Two times a day (BID) | ORAL | Status: DC
  Administered 2019-09-08: 500 mg via ORAL
  Filled 2019-09-08: qty 1

## 2019-09-08 MED ORDER — FLUOXETINE HCL 20 MG PO CAPS
20.0000 mg | ORAL_CAPSULE | Freq: Every day | ORAL | Status: DC
  Administered 2019-09-08 – 2019-09-09 (×2): 20 mg via ORAL
  Filled 2019-09-08 (×2): qty 1

## 2019-09-08 MED ORDER — CLOZAPINE 100 MG PO TABS
400.0000 mg | ORAL_TABLET | Freq: Every day | ORAL | Status: DC
  Administered 2019-09-08: 400 mg via ORAL
  Filled 2019-09-08 (×2): qty 4

## 2019-09-08 MED ORDER — FLUOXETINE HCL 20 MG PO CAPS: 40.0000 mg | ORAL_CAPSULE | Freq: Every day | ORAL | Status: DC

## 2019-09-08 MED ORDER — DIVALPROEX SODIUM ER 500 MG PO TB24
500.0000 mg | ORAL_TABLET | Freq: Every morning | ORAL | Status: DC
  Administered 2019-09-09: 500 mg via ORAL
  Filled 2019-09-08: qty 1

## 2019-09-08 MED ORDER — DIVALPROEX SODIUM 500 MG PO DR TAB
750.0000 mg | DELAYED_RELEASE_TABLET | Freq: Every day | ORAL | Status: DC
  Administered 2019-09-08: 750 mg via ORAL
  Filled 2019-09-08: qty 1

## 2019-09-08 MED ORDER — PROPRANOLOL HCL ER 60 MG PO CP24
60.0000 mg | ORAL_CAPSULE | Freq: Every day | ORAL | Status: DC
  Administered 2019-09-08 – 2019-09-09 (×2): 60 mg via ORAL
  Filled 2019-09-08 (×2): qty 1

## 2019-09-08 MED ORDER — CLOZAPINE 100 MG PO TABS
200.0000 mg | ORAL_TABLET | Freq: Every day | ORAL | Status: DC
  Administered 2019-09-09: 200 mg via ORAL
  Filled 2019-09-08 (×3): qty 2

## 2019-09-08 MED ORDER — BENZTROPINE MESYLATE 1 MG PO TABS
2.0000 mg | ORAL_TABLET | Freq: Every day | ORAL | Status: DC
  Administered 2019-09-08 – 2019-09-09 (×2): 2 mg via ORAL
  Filled 2019-09-08 (×2): qty 2

## 2019-09-08 NOTE — Consult Note (Addendum)
[facility] Psych ED Discharge  09/08/2019 10:28 AM Brian Stafford  MRN:  [id number] Principal Problem: Aggression Discharge Diagnoses: Principal Problem:   Aggression Active Problems:   Schizophrenia [facility])   Subjective: Brian Stafford is an 44 y.o. single male who presents unaccompanied to [facility] ED after being petitioned for involuntary by Youlanda Mighty, owner of [facility] [phone]. Affidavit and petition states Pt was aggressive towards group home staff. He is having auditory and visual hallucinations and is not eating or sleeping properly.   Pt has a diagnosis of schizophrenia and intellectual disability. During TTS assessment, Pt was lying under a blanket and minimally cooperative, giving single-word responses. He denies current suicidal ideation or history of suicide attempts. He denies current thoughts of harming others. He acknowledges experiencing auditory and visual hallucinations and told EDP that he has chronic hallucinations, this has been going on for years.  He does not feel threatened by them.  They do not tell him to hurt himself or anyone else.  There is been no change in his hallucinations. He denies alcohol or other substance use. He does not explain what happened today at group home. He does not identify any stressors. He does not identify any family members who are supportive.  Patient seen and case discussed with Dr. Jannifer Franklin. Patient evaluation is limited by his mild cognitive impairment. He is alert and oriented, responsive to staff, calm and cooperative. He is easily awaken by nursing staff, and responds appropriately. He provides one word answers, and answers questions appropriately in a soft voice. He states the group home  Brought him because they were concerned about his health. Patient reports compliance with his medications. THis practitioner ordered a valproic acid level (48). Patient appears to be at baseline and has not exhibited any disruptive or  overt physical aggression while in the ER. Will make adjustments to medications and psych clear.    Total Time spent with patient: 30 minutes  Past Psychiatric History:  Past Medical History:  Past Medical History:  Diagnosis Date   Anxiety    Bipolar 1 disorder (HCC)    Mental retardation    Schizophrenia (HCC)    History reviewed. No pertinent surgical history. Family History: History reviewed. No pertinent family history. Family Psychiatric  History: Unavailable Social History:  Social History   Substance and Sexual Activity  Alcohol Use None     Social History   Substance and Sexual Activity  Drug Use Not on file    Social History   Socioeconomic History   Marital status: Single    Spouse name: Not on file   Number of children: Not on file   Years of education: Not on file   Highest education level: Not on file  Occupational History   Not on file  Tobacco Use   Smoking status: Not on file  Substance and Sexual Activity   Alcohol use: Not on file   Drug use: Not on file   Sexual activity: Not on file  Other Topics Concern   Not on file  Social History Narrative   Not on file   Social Determinants of Health   Financial Resource Strain:    Difficulty of Paying Living Expenses:   Food Insecurity:    Worried About Running Out of Food in the Last Year:    Merchant navy officer of Food in the Last Year:   Transportation Needs:    Freight forwarder (Medical):    Lack of Transportation (Non-Medical):  Physical Activity:    Days of Exercise per Week:    Minutes of Exercise per Session:   Stress:    Feeling of Stress :   Social Connections:    Frequency of Communication with Friends and Family:    Frequency of Social Gatherings with Friends and Family:    Attends Religious Services:    Active Member of Clubs or Organizations:    Attends Engineer, structural:    Marital Status:     Has this patient used any form of tobacco in the last 30 days?  (Cigarettes, Smokeless Tobacco, Cigars, and/or Pipes) Prescription not provided because: Patient does not smoke  Current Medications: Current Facility-Administered Medications  Medication Dose Route Frequency Provider Last Rate Last Admin   benztropine (COGENTIN) tablet 2 mg  2 mg Oral Daily Starkes-Perry, Takia S, FNP       cloZAPine (CLOZARIL) tablet 200-400 mg  200-400 mg Oral See admin instructions Starkes-Perry, Juel Burrow, FNP       divalproex (DEPAKOTE ER) 24 hr tablet 500 mg  500 mg Oral BID Starkes-Perry, Juel Burrow, FNP       FLUoxetine (PROZAC) capsule 20 mg  20 mg Oral Daily Starkes-Perry, Juel Burrow, FNP       multivitamin with minerals tablet 1 tablet  1 tablet Oral Daily Starkes-Perry, Juel Burrow, FNP       propranolol ER (INDERAL LA) 24 hr capsule 60 mg  60 mg Oral Daily Starkes-Perry, Juel Burrow, FNP       Current Outpatient Medications  Medication Sig Dispense Refill   benztropine (COGENTIN) 2 MG tablet Take 1 tablet by mouth daily.     cloZAPine (CLOZARIL) 100 MG tablet Take 200-400 mg by mouth See admin instructions. Pt takes 200mg  in am and 400mg  at bedtime     divalproex (DEPAKOTE ER) 500 MG 24 hr tablet Take 500 mg by mouth 2 (two) times daily.     FLUoxetine (PROZAC) 40 MG capsule Take 40 mg by mouth daily.     Multiple Vitamin (MULTIVITAMIN WITH MINERALS) TABS Take 1 tablet by mouth daily.     OLANZapine (ZYPREXA) 10 MG tablet Take 10 mg by mouth at bedtime.     propranolol ER (INDERAL LA) 60 MG 24 hr capsule Take 60 mg by mouth daily.     PTA Medications: (Not in a hospital admission)   Musculoskeletal: Strength & Muscle Tone: within normal limits Gait & Station: normal Patient leans: N/A  Psychiatric Specialty Exam: Physical Exam  Review of Systems  Blood pressure (!) 106/55, pulse 88, temperature 98.5 F (36.9 C), resp. rate 17, height 6\' 3"  (1.905 m), weight 88.5 kg, SpO2 100 %.Body mass index is 24.37 kg/m.  General Appearance: Fairly Groomed  Eye Contact:  Fair   Speech:  Slow and speakingin one word answers  Volume:  Decreased  Mood:   normal  Affect:  Flat  Thought Process:  Coherent, Linear and Descriptions of Associations: Intact  Orientation:  Full (Time, Place, and Person)  Thought Content:  Logical  Suicidal Thoughts:  No  Homicidal Thoughts:  No  Memory:  Immediate;   Fair Recent;   Fair Remote;   Fair  Judgement:  Poor  Insight:  Shallow  Psychomotor Activity:  Normal  Concentration:  Concentration: Fair and Attention Span: Fair  Recall:  of Knowledge:  Fair  Language:  Fair  Akathisia:  No  Handed:  Right  AIMS (if indicated):     Assets:  Communication Skills Desire for Improvement Financial Resources/Insurance Leisure Time Physical Health Resilience Social Support Vocational/Educational  ADL's:  Intact  Cognition:  Impaired,  Mild  Sleep:         Demographic Factors:  Male  Loss Factors: Decrease in vocational status and Financial problems/change in socioeconomic status  Historical Factors: Impulsivity  Risk Reduction Factors:   Living with another person, especially a relative, Positive social support, Positive therapeutic relationship and Positive coping skills or problem solving skills  Continued Clinical Symptoms:  Schizophrenia:   Depressive state More than one psychiatric diagnosis  Cognitive Features That Contribute To Risk:  None    Suicide Risk:  Minimal: No identifiable suicidal ideation.  Patients presenting with no risk factors but with morbid ruminations; may be classified as minimal risk based on the severity of the depressive symptoms     Plan Of Care/Follow-up recommendations:  Other:  Will psych clear. Medication adjustments were made, please pay close attention to your AVS. Please follow up with your psychiatrist as we made adjustments to your prozac and Depakote.   - Will increase Depakote Depakote 500mg  po qam and  750mg  po qhs. Will need levels rechecked by outpatient  psych provider.  -Patient with reported aggression towards new caregiver, will reduce prozac 20mg  po daily.  - Continue current medications.  Disposition: Discharge Home. Attempted to call group home, however no one answered.  , FNP 09/08/2019, 10:28 AM Patient seen face-to-face for psychiatric evaluation, chart reviewed and case discussed with the physician extender and developed treatment plan. Reviewed the information documented and agree with the treatment plan. , MD

## 2019-09-08 NOTE — Social Work (Signed)
2:00 CSW spoke with [facility] APS case worker, Charlann Noss to give report of abandonment/non-compliance by entity charged with providing care for vulnerable adult.   1:30 CSW met with Pt at bedside to gather more information about situation. Pt Calm and amiable. Pt expressed remorse at behavior that caused incident with Ms. Coltrain. Pt's behavior was appropriate.  12:20 CSW, [employer] and EDRN together called Ms. Coltrain and [employer] explained the legal facts that Pt had paid rent and was legally entitled to return. Ms. Burman Riis again refused to allow return.   11:54am CSW called group home owner Serafina Mitchell @ [phone] and informed her that Pt has been cleared medically and by psychiatry and that medication has been adjusted. Ms. Burman Riis admantly states that she will not allow return of Pt despite the facts presented and that she has accepted payment for his care. CSW told Ms. Coltrain that CSW would be obligated to make APS report.   Vergie Living MSW LCSWA Transitions of Care  Clinical Social Worker  [facility] Departments  [phone]

## 2019-09-08 NOTE — ED Notes (Signed)
Called group home, refused to accept patient back. SW involved. [employer] involved.

## 2019-09-09 NOTE — ED Provider Notes (Signed)
Emergency Medicine Observation Re-evaluation Note  Brian Stafford is a 44 y.o. male, seen on rounds today.  Pt initially presented to the ED for complaints of Aggressive Behavior Currently, the patient is Awaiting placement back to her group home.  Plan is for patient to be transferred [facility].  Physical Exam  BP 104/73 (BP Location: Right Arm)    Pulse 81    Temp 98.3 F (36.8 C) (Oral)    Resp 18    Ht 1.905 m (6\' 3" )    Wt 88.5 kg    SpO2 100%    BMI 24.37 kg/m  Physical Exam Vitals and nursing note reviewed.  Constitutional:      General: He is not in acute distress.    Appearance: He is not ill-appearing, toxic-appearing or diaphoretic.  HENT:     Head: Normocephalic and atraumatic.     Nose: No rhinorrhea.  Cardiovascular:     Rate and Rhythm: Normal rate and regular rhythm.  Pulmonary:     Effort: Pulmonary effort is normal. No respiratory distress.     Breath sounds: Normal breath sounds.  Abdominal:     General: There is no distension.  Musculoskeletal:        General: No swelling or deformity.  Psychiatric:     Comments: Calm and cooperative, not aggressive or agitated     ED Course / MDM  EKG:EKG Interpretation  Date/Time:  Saturday September 07 2019 22:44:03 EDT Ventricular Rate:  94 PR Interval:  144 QRS Duration: 86 QT Interval:  366 QTC Calculation: 457 R Axis:   80 Text Interpretation: Normal sinus rhythm Normal ECG Confirmed by 07-26-2002 (Marianna Fuss) on 09/07/2019 11:00:15 PM    I have reviewed the labs performed to date as well as medications administered while in observation.  Recent changes in the last 24 hours include no acute issues overnight.   Plan  Current plan is for transfer to an alternative group home.    11/07/2019, MD 09/09/19 1022

## 2019-09-09 NOTE — ED Notes (Signed)
IVC has been rescinded by Dr. Lynelle Doctor.

## 2019-09-09 NOTE — ED Notes (Signed)
SW working with pt to get a new group home Location manager). Transportation provided [employer]) by SW. Pt escorted to the vehicle. He was calm, cooperative and pleasant during his entire visit. Paperwork given to the driver with instructions to give it to the group home. Driver understood and agreed. Pt has all of his belongings.

## 2019-09-09 NOTE — ED Notes (Signed)
Brian Stafford with pt's group home [phone], called and said that she has found a place for pt to go to live. Contact is Victorino Dike with [facility]. Victorino Dike is Engineer, site with [employer]. [phone].  Brian Bailey said that she "Just cannot handle him."

## 2019-09-09 NOTE — Discharge Instructions (Signed)
Follow up with your doctors.  Continue your medications

## 2019-09-09 NOTE — Social Work (Signed)
TOC CSW spoke with [name]/[facility], [phone].  Pt has been accepted there.  Pt will be transported to [address] (Day Program of [facility]).   Brian Stafford also concluded that she would be contacting [employer] at current group home, [phone] for necessary information for transfer of services.  Brian Stafford also stated that pt is familiar with their services/programs and she would be happy to accept pt back.  CSW will begin process for dc. to [facility].  CSW will continue to follow for dc needs.  Kalep Full Tarpley-Carter, MSW, LCSW-A                  [facility] ED Transitions of CareClinical Social Worker [email or web address] [phone]

## 2019-09-09 NOTE — Social Work (Signed)
TOC CSW spoke to pt about going to [facility].  Pt acknowledged that he did like it at [facility] and does not mind going back there.  CSW is arranging for transportation.  Pt has signed Therapist, nutritional.  Nolon Bussing Waiver has been faxed to [employer].  Pt is ready for pick up.  CSW will continue to follow for dc needs.  Kyanne Rials Tarpley-Carter, MSW, LCSW-A                  [facility] ED Transitions of CareClinical Social Worker [email or web address] [phone]

## 2019-09-30 MED FILL — Multiple Vitamins w/ Minerals Tab: ORAL | Qty: 1 | Status: AC

## 2019-09-30 MED FILL — Clozapine Tab 100 MG: ORAL | Qty: 200 | Status: AC

## 2019-09-30 MED FILL — Benztropine Mesylate Tab 1 MG: ORAL | Qty: 2 | Status: AC

## 2019-09-30 MED FILL — Divalproex Sodium Tab ER 24 HR 500 MG: ORAL | Qty: 500 | Status: AC

## 2019-12-03 ENCOUNTER — Emergency Department (HOSPITAL_COMMUNITY)
Admission: EM | Admit: 2019-12-03 | Discharge: 2019-12-04 | Disposition: A | Discharge status: Home / Self Care | Payer: 59 | Source: Home / Self Care | Attending: Emergency Medicine | Admitting: Emergency Medicine

## 2019-12-03 ENCOUNTER — Encounter (HOSPITAL_COMMUNITY): Payer: Self-pay | Admitting: Emergency Medicine

## 2019-12-03 DIAGNOSIS — R441 Visual hallucinations: Secondary | ICD-10-CM

## 2019-12-03 DIAGNOSIS — R000 Tachycardia, unspecified: Secondary | ICD-10-CM | POA: Insufficient documentation

## 2019-12-03 DIAGNOSIS — F419 Anxiety disorder, unspecified: Secondary | ICD-10-CM | POA: Insufficient documentation

## 2019-12-03 DIAGNOSIS — Z79899 Other long term (current) drug therapy: Secondary | ICD-10-CM | POA: Insufficient documentation

## 2019-12-03 DIAGNOSIS — R569 Unspecified convulsions: Secondary | ICD-10-CM | POA: Diagnosis not present

## 2019-12-03 DIAGNOSIS — Z20822 Contact with and (suspected) exposure to covid-19: Secondary | ICD-10-CM | POA: Insufficient documentation

## 2019-12-03 DIAGNOSIS — F79 Unspecified intellectual disabilities: Secondary | ICD-10-CM | POA: Insufficient documentation

## 2019-12-03 DIAGNOSIS — R45851 Suicidal ideations: Secondary | ICD-10-CM | POA: Diagnosis not present

## 2019-12-03 DIAGNOSIS — R440 Auditory hallucinations: Secondary | ICD-10-CM | POA: Insufficient documentation

## 2019-12-03 LAB — CBC WITH DIFFERENTIAL/PLATELET
Abs Immature Granulocytes: 0.02 10*3/uL (ref 0.00–0.07)
Basophils Absolute: 0 10*3/uL (ref 0.0–0.1)
Basophils Relative: 0 %
Eosinophils Absolute: 0 10*3/uL (ref 0.0–0.5)
Eosinophils Relative: 0 %
HCT: 35.9 % — ABNORMAL LOW (ref 39.0–52.0)
Hemoglobin: 11.6 g/dL — ABNORMAL LOW (ref 13.0–17.0)
Immature Granulocytes: 0 %
Lymphocytes Relative: 14 %
Lymphs Abs: 1 10*3/uL (ref 0.7–4.0)
MCH: 27.4 pg (ref 26.0–34.0)
MCHC: 32.3 g/dL (ref 30.0–36.0)
MCV: 84.9 fL (ref 80.0–100.0)
Monocytes Absolute: 0.6 10*3/uL (ref 0.1–1.0)
Monocytes Relative: 8 %
Neutro Abs: 5.6 10*3/uL (ref 1.7–7.7)
Neutrophils Relative %: 78 %
Platelets: 304 10*3/uL (ref 150–400)
RBC: 4.23 MIL/uL (ref 4.22–5.81)
RDW: 12.9 % (ref 11.5–15.5)
WBC: 7.3 10*3/uL (ref 4.0–10.5)
nRBC: 0 % (ref 0.0–0.2)

## 2019-12-03 LAB — COMPREHENSIVE METABOLIC PANEL
ALT: 15 U/L (ref 0–44)
AST: 24 U/L (ref 15–41)
Albumin: 4.2 g/dL (ref 3.5–5.0)
Alkaline Phosphatase: 76 U/L (ref 38–126)
Anion gap: 14 (ref 5–15)
BUN: 24 mg/dL — ABNORMAL HIGH (ref 6–20)
CO2: 18 mmol/L — ABNORMAL LOW (ref 22–32)
Calcium: 9.3 mg/dL (ref 8.9–10.3)
Chloride: 109 mmol/L (ref 98–111)
Creatinine, Ser: 1.2 mg/dL (ref 0.61–1.24)
GFR, Estimated: >60 mL/min (ref >60)
Glucose, Bld: 83 mg/dL (ref 70–99)
Potassium: 4 mmol/L (ref 3.5–5.1)
Sodium: 141 mmol/L (ref 135–145)
Total Bilirubin: 0.8 mg/dL (ref 0.3–1.2)
Total Protein: 7.8 g/dL (ref 6.5–8.1)

## 2019-12-03 LAB — SALICYLATE LEVEL: Salicylate Lvl: <7 mg/dL — ABNORMAL LOW (ref 7.0–30.0)

## 2019-12-03 LAB — VALPROIC ACID LEVEL: Valproic Acid Lvl: 58 ug/mL (ref 50.0–100.0)

## 2019-12-03 LAB — RESPIRATORY PANEL BY RT PCR (FLU A&B, COVID)
Influenza A by PCR: NEGATIVE
Influenza B by PCR: NEGATIVE
SARS Coronavirus 2 by RT PCR: NEGATIVE

## 2019-12-03 LAB — ETHANOL: Alcohol, Ethyl (B): <10 mg/dL (ref <10)

## 2019-12-03 LAB — ACETAMINOPHEN LEVEL: Acetaminophen (Tylenol), Serum: <10 ug/mL — ABNORMAL LOW (ref 10–30)

## 2019-12-03 MED ORDER — LORAZEPAM 1 MG PO TABS
1.0000 mg | ORAL_TABLET | Freq: Once | ORAL | Status: AC
  Administered 2019-12-03: 1 mg via ORAL
  Filled 2019-12-03: qty 1

## 2019-12-03 NOTE — ED Notes (Signed)
Patient stated that he has joy in his heart right now

## 2019-12-03 NOTE — ED Notes (Addendum)
Patient stated that someone was sitting in the chair in front of him and sticking out its tongue. Patient did not know if it was a male of male, yet they made him feel bad. I asked if I could ask the person to leave, patient agreed.  The person left .

## 2019-12-03 NOTE — ED Notes (Signed)
Patient's dinner tray delivered.

## 2019-12-03 NOTE — ED Provider Notes (Signed)
[facility]-EMERGENCY DEPT Provider Note   CSN: [id number] Arrival date & time: 12/03/19  1303     History Chief Complaint  Patient presents with  . Psychiatric Evaluation    Brian Stafford is a 44 y.o. male.  HPI Patient is a 44 year old male with a history of bipolar 1 disorder, mild intellectual disability disorder, GAD, social anxiety disorder, schizophrenia, anxiety, who presents to the emergency department via [facility] due to visual and auditory hallucinations.  Per nursing note, patient was brought in from a counseling center due to appearing delusional.  They state that he was experiencing visual and auditory hallucinations.  They were told that at times he be aggressive.  I spoke to a Child psychotherapist at his group home. He came back to the group home about 3-4 months ago he began experiencing twitching as well as bizarre movements with his mouth and tongue. Saying he is seeing/hearing demons and that people are touching him at night.  Says he is constantly repeating things and going in other peoples rooms at his group home. He is asking them to call his dad to tell him that demons are in their home. They have an employee in the group home that is giving him his medications and they believe that he has been compliant.   On my exam patient confirms the history above.  States that he hears voices that sometimes "tell him to do good things and other times bad things".  Feels that he regularly sees things that are not there.  Denies any SI/HI to me.  Level 5 caveat due to altered mental status      Past Medical History:  Diagnosis Date  . Anxiety   . Bipolar 1 disorder (HCC)   . Mental retardation   . Schizophrenia [facility])     Patient Active Problem List   Diagnosis Date Noted  . Schizophrenia (HCC) 09/08/2019  . Aggression 09/08/2019    History reviewed. No pertinent surgical history.     No family history on file.  Social History   Tobacco Use  .  Smoking status: Not on file  Substance Use Topics  . Alcohol use: Not on file  . Drug use: Not on file    Home Medications Prior to Admission medications   Medication Sig Start Date End Date Taking? Authorizing Provider  benztropine (COGENTIN) 2 MG tablet Take 1 tablet by mouth daily. 08/02/19   [provider]  cloZAPine (CLOZARIL) 100 MG tablet Take 200-400 mg by mouth See admin instructions. Pt takes 200mg  in am and 400mg  at bedtime    [provider]  divalproex (DEPAKOTE ER) 500 MG 24 hr tablet Take 500 mg by mouth 2 (two) times daily.    [provider]  FLUoxetine (PROZAC) 40 MG capsule Take 40 mg by mouth daily. 09/02/19   [provider]  Multiple Vitamin (MULTIVITAMIN WITH MINERALS) TABS Take 1 tablet by mouth daily.    [provider]  OLANZapine (ZYPREXA) 10 MG tablet Take 10 mg by mouth at bedtime. 09/05/19   [provider]  propranolol ER (INDERAL LA) 60 MG 24 hr capsule Take 60 mg by mouth daily. 09/05/19   [provider]    Allergies    Patient has no known allergies.  Review of Systems   Review of Systems  Unable to perform ROS: Mental status change   Physical Exam Updated Vital Signs BP 126/84 (BP Location: Right Arm)   Pulse (!) 115  Temp 98.5 F (36.9 C) (Oral)   Resp 20   SpO2 100%   Physical Exam Vitals and nursing note reviewed.  Constitutional:      General: He is not in acute distress.    Appearance: Normal appearance. He is not ill-appearing, toxic-appearing or diaphoretic.  HENT:     Head: Normocephalic and atraumatic.     Right Ear: External ear normal.     Left Ear: External ear normal.     Nose: Nose normal.     Mouth/Throat:     Mouth: Mucous membranes are moist.     Pharynx: Oropharynx is clear. No oropharyngeal exudate or posterior oropharyngeal erythema.  Eyes:     Extraocular Movements: Extraocular movements intact.  Cardiovascular:     Rate and Rhythm: Regular rhythm.  Tachycardia present.     Pulses: Normal pulses.     Heart sounds: Normal heart sounds. No murmur heard.  No friction rub. No gallop.   Pulmonary:     Effort: Pulmonary effort is normal. No respiratory distress.     Breath sounds: Normal breath sounds. No stridor. No wheezing, rhonchi or rales.  Abdominal:     General: Abdomen is flat.     Palpations: Abdomen is soft.     Tenderness: There is no abdominal tenderness.  Musculoskeletal:        General: Normal range of motion.     Cervical back: Normal range of motion and neck supple. No tenderness.  Skin:    General: Skin is warm and dry.  Neurological:     General: No focal deficit present.     Mental Status: He is alert.  Psychiatric:        Mood and Affect: Mood is anxious.        Speech: Speech is rapid and pressured.        Behavior: Behavior normal. Behavior is cooperative.        Thought Content: Thought content is paranoid and delusional. Thought content does not include homicidal or suicidal ideation.     Comments: Constant mouth and tongue movement consistent with tardive dyskinesia.    ED Results / Procedures / Treatments   Labs (all labs ordered are listed, but only abnormal results are displayed) Labs Reviewed  COMPREHENSIVE METABOLIC PANEL - Abnormal; Notable for the following components:      Result Value   CO2 18 (*)    BUN 24 (*)    All other components within normal limits  SALICYLATE LEVEL - Abnormal; Notable for the following components:   Salicylate Lvl <7.0 (*)    All other components within normal limits  ACETAMINOPHEN LEVEL - Abnormal; Notable for the following components:   Acetaminophen (Tylenol), Serum <10 (*)    All other components within normal limits  CBC WITH DIFFERENTIAL/PLATELET - Abnormal; Notable for the following components:   Hemoglobin 11.6 (*)    HCT 35.9 (*)    All other components within normal limits  RESPIRATORY PANEL BY RT PCR (FLU A&B, COVID)  ETHANOL  VALPROIC ACID LEVEL   RAPID URINE DRUG SCREEN, HOSP PERFORMED  URINALYSIS, ROUTINE W REFLEX MICROSCOPIC  CLOZAPINE (CLOZARIL)   EKG EKG Interpretation  Date/Time:  Tuesday December 03 2019 14:31:19 EDT Ventricular Rate:  105 PR Interval:    QRS Duration: 84 QT Interval:  345 QTC Calculation: 456 R Axis:   83 Text Interpretation: Sinus tachycardia Biatrial enlargement Minimal ST depression, inferior leads 12 Lead; Mason-Likar Since last tracing rate faster Confirmed by Linwood Dibbles (  [id number]) on 12/03/2019 3:18:39 PM  Radiology No results found.  Procedures Procedures   Medications Ordered in ED Medications  LORazepam (ATIVAN) tablet 1 mg (1 mg Oral Given 12/03/19 1758)   ED Course  I have reviewed the triage vital signs and the nursing notes.  Pertinent labs & imaging results that were available during my care of the patient were reviewed by me and considered in my medical decision making (see chart for details).    MDM Rules/Calculators/A&P                          Patient is a 44 year old male who presents to the emergency department due to what appears to be visual and auditory hallucinations.  Patient has a significant psychiatric history.  I spoke to his social worker who states that they believe he has been experiencing worse symptoms the past few months.  Exam significant for what appears to be a tardive dyskinesia.  His social worker states that this started a few months ago.  Patient also appears paranoid, delusional, anxious throughout my exam.    Basic labs were obtained today and were reassuring.  UDS and UA in process.  Valproic acid and Clozapine obtained by NP and are pending.  Per Assunta Found, NP, disposition pending am psych evaluation. Also medication verification, restarting psychiatric medications, and information regarding patient's IQ status.   Patient feeling anxious and given a dose of Ativan.  Patient has been medically cleared at this time.  Patient pending morning psych  evaluation.   Note: Portions of this report may have been transcribed using voice recognition software. Every effort was made to ensure accuracy; however, inadvertent computerized transcription errors may be present.   Final Clinical Impression(s) / ED Diagnoses Final diagnoses:  Auditory hallucinations  Visual hallucinations   Rx / DC Orders ED Discharge Orders    None       Placido Sou, PA-C 12/03/19 1952    Linwood Dibbles, MD 12/04/19 [phone]

## 2019-12-03 NOTE — ED Notes (Addendum)
Patient stated that he wanted to call his aunt Brian Stafford.

## 2019-12-03 NOTE — ED Notes (Signed)
On admission to acute unit pt is anxious and fidgety. Cooperative. Sitter at room.

## 2019-12-03 NOTE — ED Notes (Signed)
Patient stated that he felt like he was going to pass out. Patient stated that his tongue will not stop sticking out.

## 2019-12-03 NOTE — ED Notes (Signed)
Patient asked how do you get these voices to quit controlling his speach

## 2019-12-03 NOTE — ED Notes (Signed)
Patient ambulated to the bathroom/ was not able to urinate

## 2019-12-03 NOTE — ED Triage Notes (Signed)
Patient picked up at a day facility-patient is delusional, visual hallucinations-can be aggressive at times-patient lives in group home-states he is having dental pain as well-contact [name] [phone] for additional information-patient thinks people are out to get him-unknown if patient is taking psych meds

## 2019-12-03 NOTE — ED Notes (Signed)
Patient stated that he feels like the devil is trying to take his life.

## 2019-12-03 NOTE — ED Notes (Signed)
Patient is very restless/ however remaining in the recliner

## 2019-12-03 NOTE — ED Notes (Signed)
Patient stated that he as a hot pain in his back. And asked how this thing got in there.

## 2019-12-03 NOTE — ED Notes (Signed)
Walked patient to the bathroom. Patient seemed very confused in what to do. Placed two hats in toilet to catch the urine. Patient stood there smiling and very fidgety and unable to urinate at this time.

## 2019-12-03 NOTE — ED Notes (Signed)
Pt was changed and wanded by security. Pt has 1 belongings bag located under desk in triage

## 2019-12-03 NOTE — ED Notes (Signed)
Pt said that he is scared and he appears anxious.

## 2019-12-03 NOTE — ED Notes (Addendum)
Triage 4 secured/ call light / phone cord/ leads/ O2 cord/ BP cord removed from room Sitter : Elmer Sow

## 2019-12-03 NOTE — BH Assessment (Addendum)
Assessment Note  Brian Stafford is an 44 y.o. male with history of Anxiety, Bipolar I Disorder, IDD, Schizophrenia. Patient presents to [facility] via [facility] from group home. Upon chart review, patient with visual and auditory hallucinations. [facility] nursing staff notes collateral information from group home staff, "3-4 months ago he began experiencing twitching as well as bizarre movements with his mouth and tongue. Saying he is seeing/hearing demons and that people are touching him at night.  Says he is constantly repeating things and going in other peoples rooms at his group home. He is asking them to call his dad to tell him that demons are in their home. They have an employee in the group home that is giving him his medications and they believe that he has been compliant."  Clinician assessed patient today. He verified his name and DOB. However, any further information provided was limited with his responses. He does answer, "Yes and No" at times. Clinician observed patient having difficulty in speaking. Patient with involuntary and abnormal movements of jaw, lips, and tongue. He is observed to have facial grimacing and sticking out of tongue. Patient says that his tongue taste gritty and and rubbery. He then points to the temples of head stating, "headache". Also, indicates that his left ear is hurting and burning.   He denies suicidal ideations. No history of prior attempts and/or gestures. When asked about depressive symptoms and/or stressors he did not respond. He denies HI and/or history of aggressive behaviors. When asked about AVH's he does not provide a response. However, clinician observed patient to be possible responding to internal stimuli. Witnessed patient looking around the room and behind his chair. Patient does not confirm/deny substance use history. Upon chart review, their is no noted history of substance use.   Due to limited information obtained from patient group home owner was contacted.  The group home owner [name] [phone] provided collateral. States that patient is his own legal guardian. He has lived in her group home x3 months. She says that onset of hallucinations started 1 week ago and symptoms worsened today. Patient increasingly paranoid and fearful that someone is going to kill him or "get him". He reported to group home staff several times today that someone was in his room trying to kill him and after him. States that patient looks as if he is afraid and worried. The group home manager states that patient is not on any psychiatric medications at this time. He has not had any psychiatric medications in 3 months. The only medications provided to him are medical related medications such as blood pressure pills. Says that she scheduled an appointment for him at [facility] but it's not until the end of the month. The group home manager is asking for patient to be restarted back on his medications. She doesn't know the names of the medications he should be on at the time of our conversation. She suggested calling the group home to speak with staffing to verify his medication regimen. [name] stated that does not know patient's baseline history or prior psychiatric history beyond the 3 months he has lived in her. She has no knowledge of of history related to SI, HI, AVH's, alcohol and/or drug use. 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  [name] provided additional information that patient was living at [facility] in [facility] previously. She doesn't know how long he was at his prior occupancy or reason for discharge. States  that patient is his own legal guardian.   Disposition: Per Assunta Found, NP, disposition pending am psych evaluation. Also medication verification, restarting psychiatric medications, and information regarding patient's IQ status.     Diagnosis: Schizophrenia, Bipolar I Disorder, Anxiety Disorder,  IDD (per report of  group home owner -Mild & past medical history)  Past Medical History:  Past Medical History:  Diagnosis Date  . Anxiety   . Bipolar 1 disorder (HCC)   . Mental retardation   . Schizophrenia (HCC)     History reviewed. No pertinent surgical history.  Family History: No family history on file.  Social History:  has no history on file for tobacco use, alcohol use, and drug use.  Additional Social History:  Alcohol / Drug Use Pain Medications: Denies use Prescriptions: Denies abuse Over the Counter: Denies abuse History of alcohol / drug use?:  (unk, upon chart review no history of substance use) Longest period of sobriety (when/how long): NA  CIWA: CIWA-Ar BP: 126/84 Pulse Rate: (!) 115 COWS:    Allergies: No Known Allergies  Home Medications: (Not in a hospital admission)   OB/GYN Status:  No LMP for male patient.  General Assessment Data [facility] of Assessment: [facility] TTS Assessment: In system Is this a Tele or Face-to-Face Assessment?: Tele Assessment Is this an Initial Assessment or a Re-assessment for this encounter?: Initial Assessment Language Other than English: No Living Arrangements:  (group home ) What gender do you identify as?: Male Date Telepsych consult ordered in [facility]:  (12/03/2019) Marital status: Single Maiden name:  (n/s) Living Arrangements: Group Home Can pt return to current living arrangement?:  (unk) Admission Status: Voluntary Is patient capable of signing voluntary admission?: Yes Referral Source: Self/Family/Friend     Crisis Care Plan Living Arrangements: Group Home Legal Guardian:  (no legal guardian ) Name of Psychiatrist:  (upcoming appot at [facility], per group home owner) Name of Therapist:  (no therapist )  Education Status Is patient currently in school?: No  Risk to self with the past 6 months Suicidal Ideation: No Has patient been a risk to self within the past 6 months prior to admission? : No Suicidal Intent: No Has  patient had any suicidal intent within the past 6 months prior to admission? : No Is patient at risk for suicide?: No Suicidal Plan?: No Has patient had any suicidal plan within the past 6 months prior to admission? : No Access to Means: No Previous Attempts/Gestures: No How many times?:  (n/a) Other Self Harm Risks:  (denies ) Triggers for Past Attempts:  (no ) Intentional Self Injurious Behavior: None Family Suicide History: Unknown Recent stressful life event(s):  (patient did not respond when asked ) Persecutory voices/beliefs?: Yes Depression:  (patient did not confirm and/or deny ) Substance abuse history and/or treatment for substance abuse?: No Suicide prevention information given to non-admitted patients: Not applicable  Risk to Others within the past 6 months Homicidal Ideation: No Does patient have any lifetime risk of violence toward others beyond the six months prior to admission? : No Thoughts of Harm to Others: No Current Homicidal Intent: No Current Homicidal Plan: No Access to Homicidal Means: No Identified Victim:  (no victim noted ) History of harm to others?: No (pt denies; group home owner doesn't know hx) Assessment of Violence:  (group home doesn't know history past 3 months; pt denies) Violent Behavior Description:  (group home owner doesn't know hx; pt denies ) Does patient have access to weapons?: No (no  weapons in group home, per owner ) Criminal Charges Pending?: No (patient does not answer; group home owner doesn't know) Does patient have a court date: No (patient does not answer; group home owner doesn't know hx ) Is patient on probation?: Unknown  Psychosis Hallucinations: Auditory (pt acknowledges that he hears voices; no details provided ) Delusions: Unspecified  Mental Status Report Appearance/Hygiene: Disheveled, In scrubs Eye Contact: Poor Motor Activity: Restlessness, Gestures Speech: Slow, Incoherent, Elective mutism Level of  Consciousness: Alert, Restless Mood: Preoccupied, Labile Affect: Blunted, Preoccupied Anxiety Level:  (unk ) Thought Processes: Thought Blocking Judgement: Impaired Orientation: Person Obsessive Compulsive Thoughts/Behaviors: None  Cognitive Functioning Concentration: Decreased Memory: Recent Impaired, Remote Impaired Is patient IDD: No Insight: Poor Impulse Control: Poor Appetite:  (unk; pt did not respond when asked ) Have you had any weight changes? :  (unk) Sleep:  (unk ) Total Hours of Sleep:  (unk) Vegetative Symptoms:  (unk )  ADLScreening [facility] Assessment Services) Patient's cognitive ability adequate to safely complete daily activities?: Yes Patient able to express need for assistance with ADLs?: Yes Independently performs ADLs?: Yes (appropriate for developmental age)  Prior Inpatient Therapy Prior Inpatient Therapy:  (unk )  Prior Outpatient Therapy Prior Outpatient Therapy: Yes Prior Therapy Dates:  (unk) Prior Therapy Facilty/Provider(s):  (group owner unaware of hx but has scheduled an upcoming appt) Reason for Treatment:  (Schizophrenia Diagnosis ) Does patient have an ACCT team?: No Does patient have Intensive In-House Services?  : No Does patient have [facility] services? : No Does patient have P4CC services?: No  ADL Screening (condition at time of admission) Patient's cognitive ability adequate to safely complete daily activities?: Yes Patient able to express need for assistance with ADLs?: Yes Independently performs ADLs?: Yes (appropriate for developmental age)       Abuse/Neglect Assessment (Assessment to be complete while patient is alone) Abuse/Neglect Assessment Can Be Completed:  (unk; UTA)                Disposition: Per Assunta Found, NP, disposition pending am psych evaluation. Also medication verification, restarting psychiatric medications, and information regarding patient's IQ status.   Disposition Initial Assessment Completed  for this Encounter: Yes  On Site Evaluation by:   Reviewed with Physician:    Melynda Ripple 12/03/2019 5:15 PM

## 2019-12-03 NOTE — ED Notes (Addendum)
Patient stated that his head is hurting pointed to both (L) (R) temples.   Patient stated that his tongue had a taste of burned rubber and felt very prickly.  Patient stated his (L) ear was hurting, then stated it was both ears and they were hot and felt like something was stabbing in them.   Patient stated that he is hearing voices when asked question and said that these voices make him happy.

## 2019-12-03 NOTE — BH Assessment (Addendum)
Per Assunta Found, NP, disposition pending am psych evaluation. Also medication verification, restarting psychiatric medications, and information regarding patient's IQ status.

## 2019-12-04 ENCOUNTER — Encounter (HOSPITAL_COMMUNITY): Payer: Self-pay | Admitting: Registered Nurse

## 2019-12-04 LAB — CLOZAPINE (CLOZARIL)
Clozapine Lvl: <20 ng/mL — ABNORMAL LOW (ref 350–650)
NorClozapine: <20 ng/mL
Total(Cloz+Norcloz): <40 ng/mL

## 2019-12-04 MED ORDER — OLANZAPINE 10 MG PO TABS: 10.0000 mg | ORAL_TABLET | Freq: Every day | ORAL | Status: DC

## 2019-12-04 MED ORDER — OLANZAPINE 10 MG PO TABS
10.0000 mg | ORAL_TABLET | Freq: Every day | ORAL | 0 refills | Status: DC
Start: 2019-12-04 — End: 2019-12-14

## 2019-12-04 MED ORDER — PROPRANOLOL HCL ER 60 MG PO CP24: 60.0000 mg | ORAL_CAPSULE | Freq: Every day | ORAL | Status: DC

## 2019-12-04 MED ORDER — BENZTROPINE MESYLATE 2 MG PO TABS
2.0000 mg | ORAL_TABLET | Freq: Every day | ORAL | 0 refills | Status: DC
Start: 2019-12-04 — End: 2022-08-14

## 2019-12-04 MED ORDER — BENZTROPINE MESYLATE 1 MG/ML IJ SOLN
1.0000 mg | Freq: Once | INTRAMUSCULAR | Status: AC
  Administered 2019-12-04: 1 mg via INTRAMUSCULAR
  Filled 2019-12-04: qty 2

## 2019-12-04 MED ORDER — ADULT MULTIVITAMIN W/MINERALS CH: 1.0000 | ORAL_TABLET | Freq: Every day | ORAL | Status: DC

## 2019-12-04 MED ORDER — DIVALPROEX SODIUM ER 500 MG PO TB24: 500.0000 mg | ORAL_TABLET | Freq: Two times a day (BID) | ORAL | Status: DC

## 2019-12-04 MED ORDER — FLUOXETINE HCL 40 MG PO CAPS
40.0000 mg | ORAL_CAPSULE | Freq: Every day | ORAL | 0 refills | Status: DC
Start: 2019-12-04 — End: 2022-08-14

## 2019-12-04 MED ORDER — PROPRANOLOL HCL ER 60 MG PO CP24
60.0000 mg | ORAL_CAPSULE | Freq: Every day | ORAL | 0 refills | Status: DC
Start: 2019-12-04 — End: 2019-12-14

## 2019-12-04 MED ORDER — CLOZAPINE 100 MG PO TABS
200.0000 mg | ORAL_TABLET | ORAL | 0 refills | Status: DC
Start: 2019-12-04 — End: 2019-12-14

## 2019-12-04 MED ORDER — DIVALPROEX SODIUM ER 500 MG PO TB24
500.0000 mg | ORAL_TABLET | Freq: Two times a day (BID) | ORAL | 0 refills | Status: DC
Start: 2019-12-04 — End: 2019-12-14

## 2019-12-04 MED ORDER — FLUOXETINE HCL 40 MG PO CAPS: 40.0000 mg | ORAL_CAPSULE | Freq: Every day | ORAL | Status: DC

## 2019-12-04 MED ORDER — CLOZAPINE 100 MG PO TABS
200.0000 mg | ORAL_TABLET | ORAL | Status: DC
Start: 2019-12-04 — End: 2019-12-04

## 2019-12-04 MED ORDER — BENZTROPINE MESYLATE 1 MG PO TABS: 1.0000 mg | ORAL_TABLET | Freq: Two times a day (BID) | ORAL | Status: DC

## 2019-12-04 MED ORDER — BENZTROPINE MESYLATE 1 MG PO TABS: 2.0000 mg | ORAL_TABLET | Freq: Every day | ORAL | Status: DC

## 2019-12-04 NOTE — ED Notes (Signed)
Pt stated he could go to the restroom to urinate but was unable to. Also stated there were "things" in his ears that was making him unable to hear well. Grayling Congress, NT

## 2019-12-04 NOTE — ED Notes (Signed)
Pt DC d off unit to home per provider. Pt alert, confused, calm, cooperative, no s/s of distress. DC information given to pt. Belongings given to pt. Pt ambulatory off unit, escorted by RN and NT. Pt transported by caregiver.

## 2019-12-04 NOTE — BH Assessment (Signed)
[facility] Assessment Progress Note  Per Shuvon Rankin, NP, this voluntary pt does not require psychiatric hospitalization at this time.  Pt is psychiatrically cleared.  At 12:11 this Clinical research associate called pt's group home and spoke to [place] [phone]) to notify her of pt's disposition and to facilitate his return home.  She reports that pt does not have any psychotropic medications and that he will need them before being discharged.  She reports that pt recently came to her group home from a different one in the [facility] area.  He doesn't have a current outpatient provider, but reportedly has an appointment at [facility] scheduled for 12/31/19.  I informed her that I would ask the EDP if he would agree to discharging pt with scripts, but I could not assure her that he would.  I verbally recommended that pt keep the appointment at [facility], but also informed her that [facility] offers walk-in psychiatry services, Monday - Thursday from 8:00 - 11:00.  She agrees to have someone come to [facility] to pick pt up immanently.  Discharge instructions include contact information for both [facility] and for [facility].  Disposition and medication needs have been discussed with EDP Pricilla Loveless and with pt's nurse, Beth.  Doylene Canning, MA Triage Specialist [phone]

## 2019-12-04 NOTE — Consult Note (Signed)
  Brian Stafford, 44 y.o., male patient seen face to face by this provider, consulted with Dr. Bronwen Betters; and chart reviewed on 12/04/19.  On evaluation Brian Stafford reports he is feeling "alright" this morning.  Patient complaining of tongue movement.  Patient also appears to be restless, clenching teeth and hands.  Patient stating this movement and the feeling in tounge is new for a couple months.     During evaluation Brian Stafford is alert/oriented x ; calm/cooperative.  When asked about auditory visual hallucinations patient responded "lil bit"; Patient appears to have some thought blocking.  Patient denies suicidal/self-harm/homicidal ideation, and paranoia.    Recommendation:  Give Cogentin 1 mg IM.  Monitor for decrease in EPS.  Patient may continue his current psychotropic medications.   Spoke to patients nurse who reported that there was a decrease inpatients movement prior to him being discharged.     Disposition:  No evidence of imminent risk to self or others at present.   Patient does not meet criteria for psychiatric inpatient admission. Supportive therapy provided about ongoing stressors. Discussed crisis plan, support from social network, calling 911, coming to the Emergency Department, and calling Suicide Hotline.

## 2019-12-04 NOTE — BH Assessment (Signed)
[facility] Assessment Progress Note  This Clinical research associate investigated to see if pt has a known diagnosis of IDD.  After checking pt's EPIC record, I found no psychometric testing for him to establish an IQ. At 08:50 I called the [facility] and spoke to [place].  She reports that pt has a history of services with them and that his Medicaid is affiliated with them, however, pt does not have an IDD care coordinator.  At 10:00 I called [facility] and spoke to [place].  She reports that they have no record of providing services for this patient and he is not on their wait list for services.  These details have been shared with Assunta Found, NP.  Doylene Canning, [place] Behavioral Health Coordinator [phone]

## 2019-12-04 NOTE — Discharge Instructions (Signed)
For your mental health needs, you are advised to keep your previously scheduled appointment with [facility]:       [facility]      [address]      [place], [place] [address]      [phone]  If for any reason you are unable to follow up with [facility], contact [facility]:       [facility]      [address]      [place], [place] [address]      [phone]      They offer psychiatry/medication management and therapy.  New patients are being seen in their walk-in clinic.  Walk-in hours are Monday - Thursday from 8:00 am - 11:00 am for psychiatry, and Friday from 1:00 pm - 4:00 pm for therapy.  Walk-in patients are seen on a first come, first served basis, so try to arrive as early as possible for the best chance of being seen the same day.

## 2019-12-04 NOTE — ED Provider Notes (Signed)
Patient has been cleared by psychiatry for discharge back to group home.  They are asking for prescriptions for his psychiatric medications.   Pricilla Loveless, MD 12/04/19 1245

## 2019-12-06 ENCOUNTER — Other Ambulatory Visit: Payer: Self-pay

## 2019-12-06 ENCOUNTER — Inpatient Hospital Stay (HOSPITAL_COMMUNITY): Payer: 59

## 2019-12-06 ENCOUNTER — Inpatient Hospital Stay (HOSPITAL_COMMUNITY)
Admission: EM | Admit: 2019-12-06 | Discharge: 2019-12-14 | DRG: 100 | Disposition: A | Discharge status: Home / Self Care | Payer: 59 | Attending: Internal Medicine | Admitting: Internal Medicine

## 2019-12-06 ENCOUNTER — Emergency Department (HOSPITAL_COMMUNITY): Payer: 59

## 2019-12-06 ENCOUNTER — Encounter (HOSPITAL_COMMUNITY): Payer: Self-pay | Admitting: Internal Medicine

## 2019-12-06 DIAGNOSIS — F209 Schizophrenia, unspecified: Secondary | ICD-10-CM | POA: Diagnosis not present

## 2019-12-06 DIAGNOSIS — R0902 Hypoxemia: Secondary | ICD-10-CM | POA: Diagnosis present

## 2019-12-06 DIAGNOSIS — R45851 Suicidal ideations: Secondary | ICD-10-CM | POA: Diagnosis present

## 2019-12-06 DIAGNOSIS — F4010 Social phobia, unspecified: Secondary | ICD-10-CM | POA: Diagnosis present

## 2019-12-06 DIAGNOSIS — Z79899 Other long term (current) drug therapy: Secondary | ICD-10-CM | POA: Diagnosis not present

## 2019-12-06 DIAGNOSIS — F319 Bipolar disorder, unspecified: Secondary | ICD-10-CM | POA: Diagnosis present

## 2019-12-06 DIAGNOSIS — F419 Anxiety disorder, unspecified: Secondary | ICD-10-CM | POA: Diagnosis present

## 2019-12-06 DIAGNOSIS — G9341 Metabolic encephalopathy: Secondary | ICD-10-CM | POA: Diagnosis present

## 2019-12-06 DIAGNOSIS — R569 Unspecified convulsions: Principal | ICD-10-CM | POA: Diagnosis present

## 2019-12-06 DIAGNOSIS — R509 Fever, unspecified: Secondary | ICD-10-CM

## 2019-12-06 DIAGNOSIS — Z20822 Contact with and (suspected) exposure to covid-19: Secondary | ICD-10-CM | POA: Diagnosis present

## 2019-12-06 DIAGNOSIS — E860 Dehydration: Secondary | ICD-10-CM | POA: Diagnosis present

## 2019-12-06 DIAGNOSIS — F70 Mild intellectual disabilities: Secondary | ICD-10-CM | POA: Diagnosis present

## 2019-12-06 DIAGNOSIS — E872 Acidosis: Secondary | ICD-10-CM | POA: Diagnosis present

## 2019-12-06 LAB — CBC WITH DIFFERENTIAL/PLATELET
Abs Immature Granulocytes: 0.05 10*3/uL (ref 0.00–0.07)
Basophils Absolute: 0 10*3/uL (ref 0.0–0.1)
Basophils Relative: 0 %
Eosinophils Absolute: 0 10*3/uL (ref 0.0–0.5)
Eosinophils Relative: 0 %
HCT: 36.9 % — ABNORMAL LOW (ref 39.0–52.0)
Hemoglobin: 11.8 g/dL — ABNORMAL LOW (ref 13.0–17.0)
Immature Granulocytes: 1 %
Lymphocytes Relative: 6 %
Lymphs Abs: 0.6 10*3/uL — ABNORMAL LOW (ref 0.7–4.0)
MCH: 27.1 pg (ref 26.0–34.0)
MCHC: 32 g/dL (ref 30.0–36.0)
MCV: 84.6 fL (ref 80.0–100.0)
Monocytes Absolute: 0.8 10*3/uL (ref 0.1–1.0)
Monocytes Relative: 8 %
Neutro Abs: 8.5 10*3/uL — ABNORMAL HIGH (ref 1.7–7.7)
Neutrophils Relative %: 85 %
Platelets: 322 10*3/uL (ref 150–400)
RBC: 4.36 MIL/uL (ref 4.22–5.81)
RDW: 12.8 % (ref 11.5–15.5)
WBC: 10 10*3/uL (ref 4.0–10.5)
nRBC: 0 % (ref 0.0–0.2)

## 2019-12-06 LAB — COMPREHENSIVE METABOLIC PANEL
ALT: 25 U/L (ref 0–44)
AST: 43 U/L — ABNORMAL HIGH (ref 15–41)
Albumin: 3.7 g/dL (ref 3.5–5.0)
Alkaline Phosphatase: 74 U/L (ref 38–126)
Anion gap: 14 (ref 5–15)
BUN: 25 mg/dL — ABNORMAL HIGH (ref 6–20)
CO2: 21 mmol/L — ABNORMAL LOW (ref 22–32)
Calcium: 9 mg/dL (ref 8.9–10.3)
Chloride: 103 mmol/L (ref 98–111)
Creatinine, Ser: 1.33 mg/dL — ABNORMAL HIGH (ref 0.61–1.24)
GFR, Estimated: >60 mL/min (ref >60)
Glucose, Bld: 87 mg/dL (ref 70–99)
Potassium: 3.5 mmol/L (ref 3.5–5.1)
Sodium: 138 mmol/L (ref 135–145)
Total Bilirubin: 1.1 mg/dL (ref 0.3–1.2)
Total Protein: 7.1 g/dL (ref 6.5–8.1)

## 2019-12-06 LAB — CK: Total CK: 1084 U/L — ABNORMAL HIGH (ref 49–397)

## 2019-12-06 LAB — ETHANOL: Alcohol, Ethyl (B): <10 mg/dL (ref <10)

## 2019-12-06 LAB — URINALYSIS, ROUTINE W REFLEX MICROSCOPIC
Bacteria, UA: NONE SEEN
Bilirubin Urine: NEGATIVE
Glucose, UA: NEGATIVE mg/dL
Hgb urine dipstick: NEGATIVE
Ketones, ur: 80 mg/dL — AB
Leukocytes,Ua: NEGATIVE
Nitrite: NEGATIVE
Protein, ur: 30 mg/dL — AB
Specific Gravity, Urine: 1.026 (ref 1.005–1.030)
pH: 5 (ref 5.0–8.0)

## 2019-12-06 LAB — BLOOD GAS, VENOUS
Acid-base deficit: 1.9 mmol/L (ref 0.0–2.0)
Bicarbonate: 23.3 mmol/L (ref 20.0–28.0)
FIO2: 21
O2 Saturation: 79.3 %
Patient temperature: 37
pCO2, Ven: 46.7 mmHg (ref 44.0–60.0)
pH, Ven: 7.319 (ref 7.250–7.430)
pO2, Ven: 51.2 mmHg — ABNORMAL HIGH (ref 32.0–45.0)

## 2019-12-06 LAB — LACTIC ACID, PLASMA
Lactic Acid, Venous: 2 mmol/L (ref 0.5–1.9)
Lactic Acid, Venous: 1.4 mmol/L (ref 0.5–1.9)
Lactic Acid, Venous: 1.3 mmol/L (ref 0.5–1.9)

## 2019-12-06 LAB — ACETAMINOPHEN LEVEL: Acetaminophen (Tylenol), Serum: <10 ug/mL — ABNORMAL LOW (ref 10–30)

## 2019-12-06 LAB — RAPID URINE DRUG SCREEN, HOSP PERFORMED
Amphetamines: NOT DETECTED
Barbiturates: NOT DETECTED
Benzodiazepines: NOT DETECTED
Cocaine: NOT DETECTED
Opiates: NOT DETECTED
Tetrahydrocannabinol: NOT DETECTED

## 2019-12-06 LAB — VALPROIC ACID LEVEL: Valproic Acid Lvl: 41 ug/mL — ABNORMAL LOW (ref 50.0–100.0)

## 2019-12-06 LAB — HIV ANTIBODY (ROUTINE TESTING W REFLEX): HIV Screen 4th Generation wRfx: NONREACTIVE

## 2019-12-06 LAB — SALICYLATE LEVEL: Salicylate Lvl: <7 mg/dL — ABNORMAL LOW (ref 7.0–30.0)

## 2019-12-06 MED ORDER — SODIUM CHLORIDE 0.9 % IV BOLUS
500.0000 mL | Freq: Once | INTRAVENOUS | Status: AC
  Administered 2019-12-06: 500 mL via INTRAVENOUS

## 2019-12-06 MED ORDER — CLOZAPINE 100 MG PO TABS: 200.0000 mg | ORAL_TABLET | Freq: Every day | ORAL | Status: DC

## 2019-12-06 MED ORDER — PROPRANOLOL HCL ER 60 MG PO CP24
60.0000 mg | ORAL_CAPSULE | Freq: Every day | ORAL | Status: DC
  Administered 2019-12-08 – 2019-12-14 (×7): 60 mg via ORAL
  Filled 2019-12-06 (×7): qty 1

## 2019-12-06 MED ORDER — LEVETIRACETAM IN NACL 1500 MG/100ML IV SOLN
1500.0000 mg | Freq: Once | INTRAVENOUS | Status: AC
  Administered 2019-12-06: 1500 mg via INTRAVENOUS
  Filled 2019-12-06 (×2): qty 100

## 2019-12-06 MED ORDER — ENOXAPARIN SODIUM 40 MG/0.4ML ~~LOC~~ SOLN
40.0000 mg | SUBCUTANEOUS | Status: DC
  Administered 2019-12-07 – 2019-12-14 (×9): 40 mg via SUBCUTANEOUS
  Filled 2019-12-06 (×8): qty 0.4

## 2019-12-06 MED ORDER — CLOZAPINE 100 MG PO TABS: 200.0000 mg | ORAL_TABLET | ORAL | Status: DC

## 2019-12-06 MED ORDER — BENZTROPINE MESYLATE 2 MG PO TABS
2.0000 mg | ORAL_TABLET | Freq: Every day | ORAL | Status: DC
  Filled 2019-12-06: qty 1

## 2019-12-06 MED ORDER — CLOZAPINE 100 MG PO TABS
200.0000 mg | ORAL_TABLET | Freq: Every day | ORAL | Status: DC
  Filled 2019-12-06: qty 2

## 2019-12-06 MED ORDER — FLUOXETINE HCL 20 MG PO CAPS
40.0000 mg | ORAL_CAPSULE | Freq: Every day | ORAL | Status: DC
  Administered 2019-12-08 – 2019-12-14 (×7): 40 mg via ORAL
  Filled 2019-12-06 (×7): qty 2

## 2019-12-06 MED ORDER — LORAZEPAM 2 MG/ML IJ SOLN
2.0000 mg | Freq: Four times a day (QID) | INTRAMUSCULAR | Status: DC | PRN
  Administered 2019-12-07: 2 mg via INTRAVENOUS
  Filled 2019-12-06: qty 1

## 2019-12-06 MED ORDER — ACETAMINOPHEN 325 MG PO TABS
650.0000 mg | ORAL_TABLET | ORAL | Status: DC | PRN
  Administered 2019-12-09 – 2019-12-13 (×5): 650 mg via ORAL
  Filled 2019-12-06 (×6): qty 2

## 2019-12-06 MED ORDER — DIVALPROEX SODIUM ER 500 MG PO TB24
750.0000 mg | ORAL_TABLET | Freq: Two times a day (BID) | ORAL | Status: DC
  Administered 2019-12-07: 750 mg via ORAL
  Filled 2019-12-06 (×3): qty 1

## 2019-12-06 MED ORDER — ONDANSETRON HCL 4 MG PO TABS: 4.0000 mg | ORAL_TABLET | Freq: Four times a day (QID) | ORAL | Status: DC | PRN

## 2019-12-06 MED ORDER — SODIUM CHLORIDE 0.9 % IV SOLN: INTRAVENOUS | Status: AC

## 2019-12-06 MED ORDER — LORAZEPAM 2 MG/ML IJ SOLN
1.0000 mg | Freq: Once | INTRAMUSCULAR | Status: AC
  Administered 2019-12-06: 1 mg via INTRAVENOUS
  Filled 2019-12-06: qty 1

## 2019-12-06 MED ORDER — SODIUM CHLORIDE 0.9 % IV SOLN: 75.0000 mL/h | INTRAVENOUS | Status: DC

## 2019-12-06 MED ORDER — CLOZAPINE 100 MG PO TABS
400.0000 mg | ORAL_TABLET | Freq: Every day | ORAL | Status: DC
  Administered 2019-12-07: 400 mg via ORAL
  Filled 2019-12-06 (×3): qty 4

## 2019-12-06 MED ORDER — DIVALPROEX SODIUM ER 500 MG PO TB24
500.0000 mg | ORAL_TABLET | Freq: Two times a day (BID) | ORAL | Status: DC
  Filled 2019-12-06: qty 1

## 2019-12-06 MED ORDER — OLANZAPINE 5 MG PO TABS
10.0000 mg | ORAL_TABLET | Freq: Every day | ORAL | Status: DC
  Administered 2019-12-07: 10 mg via ORAL
  Filled 2019-12-06: qty 1
  Filled 2019-12-06: qty 2

## 2019-12-06 MED ORDER — ACETAMINOPHEN 650 MG RE SUPP: 650.0000 mg | RECTAL | Status: DC | PRN

## 2019-12-06 MED ORDER — KETOROLAC TROMETHAMINE 30 MG/ML IJ SOLN
15.0000 mg | Freq: Once | INTRAMUSCULAR | Status: AC
  Administered 2019-12-06: 15 mg via INTRAVENOUS
  Filled 2019-12-06: qty 1

## 2019-12-06 MED ORDER — ADULT MULTIVITAMIN W/MINERALS CH
1.0000 | ORAL_TABLET | Freq: Every day | ORAL | Status: DC
  Administered 2019-12-07 – 2019-12-14 (×8): 1 via ORAL
  Filled 2019-12-06 (×8): qty 1

## 2019-12-06 MED ORDER — CLOZAPINE 100 MG PO TABS: 400.0000 mg | ORAL_TABLET | Freq: Every day | ORAL | Status: DC

## 2019-12-06 MED ORDER — ONDANSETRON HCL 4 MG/2ML IJ SOLN: 4.0000 mg | Freq: Four times a day (QID) | INTRAMUSCULAR | Status: DC | PRN

## 2019-12-06 NOTE — ED Notes (Signed)
Pt had a seizure and was almost done seizing when this RN arrived. Pt was placed on NRB and MD was immediately at the bedside. Keppra is now ordered. After seizure, pt did not respond to painful stimuli for about 8 minutes. Pt is currently lethargic but opens eyes when his name is called.

## 2019-12-06 NOTE — ED Triage Notes (Signed)
BIB by [facility] after pt was picked up from group home r/t to SI. EMT on scene reported pt having seizure activity lasting approx. 2 mins in length while on the ambulace . Pt post ictal A X O x 2. EMS also reports pt having hallucinations. Pt has hx of SI, schizophrenia. Seizure pads placed.  Temp B/P 98/49 RR 250 100% RA Temp 97.8

## 2019-12-06 NOTE — ED Notes (Signed)
Date and time results received: 12/06/19 11:15 (use smartphrase ".now" to insert current time)  Test: Lactic Acid Critical Value: 2.0  Name of Provider Notified: Dr. Kristine Royal  Orders Received? Or Actions Taken?: No new orders

## 2019-12-06 NOTE — Plan of Care (Signed)
?  Problem: Education: ?Goal: Expressions of having a comfortable level of knowledge regarding the disease process will increase ?Outcome: Not Progressing ?  ?Problem: Coping: ?Goal: Ability to adjust to condition or change in health will improve ?Outcome: Not Progressing ?Goal: Ability to identify appropriate support needs will improve ?Outcome: Not Progressing ?  ?

## 2019-12-06 NOTE — Procedures (Signed)
Patient Name: [name]  MRN: [id number]  Epilepsy Attending: Charlsie Quest  Referring Physician/Provider: Dr Clemon Chambers Date: 12/06/2019 Duration: 30.07 mins  Patient history: 44 year old male with new seizure activity. EEG to evaluate for seizure.  Level of alertness: Awake, asleep  AEDs during EEG study: VPA  Technical aspects: This EEG study was done with scalp electrodes positioned according to the 10-20 International system of electrode placement. Electrical activity was acquired at a sampling rate of 500Hz  and reviewed with a high frequency filter of 70Hz  and a low frequency filter of 1Hz . EEG data were recorded continuously and digitally stored.   Description: The posterior dominant rhythm consists of 9 Hz activity of moderate voltage (25-35 uV) seen predominantly in posterior head regions, symmetric and reactive to eye opening and eye closing. Sleep was characterized by vertex waves, sleep spindles (12 to 14 Hz), maximal frontocentral region.   Hyperventilation and photic stimulation were not performed.     Of note, parts of eeg were difficult to interpret due to significant movement artifact.   IMPRESSION: This technically difficult study study is within normal limits. No seizures or epileptiform discharges were seen throughout the recording.   Nykeem Citro   '

## 2019-12-06 NOTE — ED Notes (Signed)
EEG at bedside.

## 2019-12-06 NOTE — H&P (Signed)
History and Physical    Brian Stafford:[id number] DOB: 05/15/1975 DOA: 12/06/2019  PCP: Fleet Contras, MD (Confirm with patient/family/NH records and if not entered, this has to be entered at [facility] point of entry) Patient coming from: Group home  I have personally briefly reviewed patient's old medical records in [facility]  Chief Complaint: Headache  HPI: Brian Stafford is a 44 y.o. male with medical history significant of schizophrenia, bipolar disorder, mild electro disability disorder, anxiety, presented with new onset of seizures. Patient remained confused probably from seizure and postictal state, most history obtained from ED staff and review of patient's chart. Patient started to have visual and auditory hallucination "seeing and hearing from demons" 3 days ago and came to ED for evaluation.  Patient moved to live in a couple 3 to 4 months ago, and has had since experiencing twitching like movement of lips and tongue as well as uncontrolled movements of his legs.  Patient was evaluated by psychiatrist 2 days ago in the ED, who found out that patient has been of psychiatry medication for last 3 months, at the same time, it was found the patient has involuntary tongue movement, which was improved after given 1 dose of Cogentin 1 mg IM. Patient was sent back to group home and starting psychiatric medications.  However, it appears that the patient continues to have bizarre movement and hallucinations, group home called EMS this morning. EMS arrived and found the patient had seizure with whole body shaking for about 2 minutes. ED Course: Patient had another witnessed seizure in the ED witnessed by the nurse, with brief episode of hypoxia oxygenation dropped to 70s. Whole episode lasted about a minute. CT head negative for intracranial acute etiology. Blood work, WBC 10, UDS negative, UA negative for UTI. Chest x-ray pending  Review of Systems: Unable to obtain, patient  postictal  Past Medical History:  Diagnosis Date  . Anxiety   . Bipolar 1 disorder (HCC)   . Mental retardation   . Schizophrenia (HCC)     No past surgical history on file.   has no history on file for tobacco use, alcohol use, and drug use.  No Known Allergies    Prior to Admission medications   Medication Sig Start Date End Date Taking? Authorizing Provider  benztropine (COGENTIN) 2 MG tablet Take 1 tablet (2 mg total) by mouth daily. 12/04/19  Yes Pricilla Loveless, MD  cloZAPine (CLOZARIL) 100 MG tablet Take 2-4 tablets (200-400 mg total) by mouth See admin instructions. Pt takes 200mg  in am and 400mg  at bedtime 12/04/19  Yes , MD  divalproex (DEPAKOTE ER) 500 MG 24 hr tablet Take 1 tablet (500 mg total) by mouth 2 (two) times daily. 12/04/19  Yes Pricilla Loveless, MD  FLUoxetine (PROZAC) 40 MG capsule Take 1 capsule (40 mg total) by mouth daily. 12/04/19  Yes Pricilla Loveless, MD  Multiple Vitamin (MULTIVITAMIN WITH MINERALS) TABS Take 1 tablet by mouth daily.   Yes [provider]  OLANZapine (ZYPREXA) 10 MG tablet Take 1 tablet (10 mg total) by mouth at bedtime. 12/04/19  Yes Pricilla Loveless, MD  propranolol ER (INDERAL LA) 60 MG 24 hr capsule Take 1 capsule (60 mg total) by mouth daily. 12/04/19  Yes Pricilla Loveless, MD    Physical Exam: Vitals:   12/06/19 1245 12/06/19 1300 12/06/19 1345 12/06/19 1415  BP: 122/66 124/69 (!) 145/72 118/74  Pulse: 95 (!) 108 (!) 108 (!) 103  Resp: (!) 27  20 (!)  34  Temp:    (!) 100.5 F (38.1 C)  TempSrc:    Oral  SpO2: 99% 98% 100% 100%    Constitutional: NAD, calm, comfortable Vitals:   12/06/19 1245 12/06/19 1300 12/06/19 1345 12/06/19 1415  BP: 122/66 124/69 (!) 145/72 118/74  Pulse: 95 (!) 108 (!) 108 (!) 103  Resp: (!) 27  20 (!) 34  Temp:    (!) 100.5 F (38.1 C)  TempSrc:    Oral  SpO2: 99% 98% 100% 100%   Eyes: PERRL, lids and conjunctivae normal ENMT: Mucous membranes are dry. Posterior pharynx  clear of any exudate or lesions.Normal dentition.  Neck: normal, supple, no masses, no thyromegaly Respiratory: clear to auscultation bilaterally, no wheezing, no crackles. Normal respiratory effort. No accessory muscle use.  Cardiovascular: Regular rate and rhythm, no murmurs / rubs / gallops. No extremity edema. 2+ pedal pulses. No carotid bruits.  Abdomen: no tenderness, no masses palpated. No hepatosplenomegaly. Bowel sounds positive.  Musculoskeletal: no clubbing / cyanosis. No joint deformity upper and lower extremities. Good ROM, no contractures. Normal muscle tone. Noticed monitor movement of both legs and lips Skin: no rashes, lesions, ulcers. No induration Neurologic: No facial droop, following simple commands, moving all limbs Psychiatric: Normal judgment and insight. Alert and oriented x 3. Normal mood.     Labs on Admission: I have personally reviewed following labs and imaging studies  CBC: Recent Labs  Lab 12/03/19 1423 12/06/19 0910  WBC 7.3 10.0  NEUTROABS 5.6 8.5*  HGB 11.6* 11.8*  HCT 35.9* 36.9*  MCV 84.9 84.6  PLT 304 322   Basic Metabolic Panel: Recent Labs  Lab 12/03/19 1423 12/06/19 0910  NA 141 138  K 4.0 3.5  CL 109 103  CO2 18* 21*  GLUCOSE 83 87  BUN 24* 25*  CREATININE 1.20 1.33*  CALCIUM 9.3 9.0   GFR: CrCl cannot be calculated (Unknown ideal weight.). Liver Function Tests: Recent Labs  Lab 12/03/19 1423 12/06/19 0910  AST 24 43*  ALT 15 25  ALKPHOS 76 74  BILITOT 0.8 1.1  PROT 7.8 7.1  ALBUMIN 4.2 3.7   No results for input(s): LIPASE, AMYLASE in the last 168 hours. No results for input(s): AMMONIA in the last 168 hours. Coagulation Profile: No results for input(s): INR, PROTIME in the last 168 hours. Cardiac Enzymes: No results for input(s): CKTOTAL, CKMB, CKMBINDEX, TROPONINI in the last 168 hours. BNP (last 3 results) No results for input(s): PROBNP in the last 8760 hours. HbA1C: No results for input(s): HGBA1C in the  last 72 hours. CBG: No results for input(s): GLUCAP in the last 168 hours. Lipid Profile: No results for input(s): CHOL, HDL, LDLCALC, TRIG, CHOLHDL, LDLDIRECT in the last 72 hours. Thyroid Function Tests: No results for input(s): TSH, T4TOTAL, FREET4, T3FREE, THYROIDAB in the last 72 hours. Anemia Panel: No results for input(s): VITAMINB12, FOLATE, FERRITIN, TIBC, IRON, RETICCTPCT in the last 72 hours. Urine analysis:    Component Value Date/Time   COLORURINE YELLOW 12/06/2019 0832   APPEARANCEUR HAZY (A) 12/06/2019 0832   LABSPEC 1.026 12/06/2019 0832   PHURINE 5.0 12/06/2019 0832   GLUCOSEU NEGATIVE 12/06/2019 0832   HGBUR NEGATIVE 12/06/2019 0832   BILIRUBINUR NEGATIVE 12/06/2019 0832   KETONESUR 80 (A) 12/06/2019 0832   PROTEINUR 30 (A) 12/06/2019 0832   NITRITE NEGATIVE 12/06/2019 0832   LEUKOCYTESUR NEGATIVE 12/06/2019 0832    Radiological Exams on Admission: CT Head Wo Contrast  Result Date: 12/06/2019 CLINICAL DATA:  Seizure. EXAM:  CT HEAD WITHOUT CONTRAST TECHNIQUE: Contiguous axial images were obtained from the base of the skull through the vertex without intravenous contrast. COMPARISON:  Brain MRI 12/10/2018 FINDINGS: Brain: Brain volume is normal for age. No intracranial hemorrhage, mass effect, or midline shift. No hydrocephalus. The basilar cisterns are patent. No evidence of territorial infarct or acute ischemia. No extra-axial or intracranial fluid collection. Vascular: No hyperdense vessel or unexpected calcification. Skull: No fracture or focal lesion. Sinuses/Orbits: Complete opacification of left maxillary sinus with heterogeneous material and cortical thickening. Material extends through the ostiomeatal complex which appears widened. There is mucosal thickening and opacification of the left ethmoid air cells and left frontal sinus. Mastoid air cells are clear. Included orbits are unremarkable. Other: None. IMPRESSION: 1. No acute intracranial abnormality. 2. Chronic  left-sided paranasal sinus disease. Consider elective ENT evaluation to exclude the possibility of underlying polyp or obstructing lesion. Electronically Signed   By: Narda Rutherford M.D.   On: 12/06/2019 15:31    EKG: Independently reviewed. Sinus rhythm, LVH  Assessment/Plan Active Problems:   Seizure (HCC)  (please populate well all problems here in Problem List. (For example, if patient is on BP meds at home and you resume or decide to hold them, it is a problem that needs to be her. Same for CAD, COPD, HLD and so on)  New onset seizure -Keppra loading completed in the ED -EEG pending -Neurology recommend continue Depakote 500 mg twice daily -CT head negative  Involuntary body movement -Chorea vs extrapyramidal syndrome? Psychiatrist suspect extrapyramidal syndrome 3 days ago, appeared to respond to Cogentin. Physical exam, patient muscle tone within normal limits, no fever. Will discuss with neurology about the differentials. -Continue Cogentin  Dehydration -Elevated lactic acid, physical exam showed patient is on the dry side, continue IV fluids  Schizophrenia -Psychiatry on board, on antipsychiatric medications.    DVT prophylaxis: Lovenox Code Status: Full code Family Communication: None at bedside Disposition Plan: Telemetry admission, with new onset seizure and psychiatry, likely will need more than 2 midnight hospital stay evaluate for inpatient psychiatry Consults called: Neurology and psychiatry Admission status: Telemetry admission   Emeline General MD [facility] Pager [phone]  12/06/2019, 3:45 PM

## 2019-12-06 NOTE — Progress Notes (Signed)
EEG complete - results pending. Difficult set up two person set up

## 2019-12-06 NOTE — Consult Note (Addendum)
Neurology Consultation Reason for Consult: seizure activity  Referring Physician: Kristine Royal  CC: seizure activity/hallucinations  History is obtained from:review of chart. Pt is a poor historian.   HPI: Brian Stafford is a 44 y.o. male with a history of bipolar 1 disorder, mild intellectual disability disorder, GAD, social anxiety disorder, schizophrenia, anxiety, who presents to the emergency department from group home with at least two   Seizures witnessed by EMS.  Triage notes report that patient had an at least two minute seizure at the group home, an additional seizure while in the ambulance, and a third while in the ED. He was given a loading dose of Keppra, and neurology was consulted for further evaluation and management.    This is the second admission in 3 days for this patient; last visit here was for visual and auditory hallucinations involving demons, aggressive behavior, as well as bizarre tongue movements involving his mouth and tongue. Current outpatient medications include cogentin, depakote 500mg  bid, prozac, clozaril.He lives in a group home.   LKW:  unclear   to obtain d/t pt status.  HPI. Patient is a 44 year old right handed black male with a history of bipolar 1 disorder, mild intellectual disability disorder, GAD, social anxiety disorder, schizophrenia, anxiety, who presents to the emergency department via ambulance from group home with multiple witnessed seizures. Unclear if he was given sedatives prior to arrival in the ED.  Patient is a poor historian and history is obtained from chart. He has multiple recent hospital/ED evaluations, all including visual and auditory hallucinations as well as aggressive behavior. Today patient was found to be seizing at group home and brought here for further management.   On approach patient is calm and cooperative. He answers all questions. Mouth is dry.   Review of chart shows negative respiratory panel, negative  UA, and negative toxicology screening. Clozapine, valproic acid levels are low; clozapine is <20, and valproic acid is 41. CMP shows elevated creatinine at 1.33 (baseline).   Past Medical History:  Diagnosis Date  . Anxiety   . Bipolar 1 disorder (HCC)   . Mental retardation   . Schizophrenia (HCC)     No family history on file. unable to obtain given patient's intellectual disability and mental status    Social History: lives in a group home. Urine toxicology screen is negative. Denies cigarette or illicit drug use.    Exam: Current vital signs: BP 118/74   Pulse (!) 103   Temp (!) 100.5 F (38.1 C) (Oral)   Resp (!) 34   SpO2 100%  Vital signs in last 24 hours: Temp:  [97.8 F (36.6 C)-100.5 F (38.1 C)] 100.5 F (38.1 C) (11/05 1415) Pulse Rate:  [80-108] 103 (11/05 1415) Resp:  [20-34] 34 (11/05 1415) BP: (98-145)/(49-74) 118/74 (11/05 1415) SpO2:  [89 %-100 %] 100 % (11/05 1415)   Physical Exam  Constitutional: 44 year old thin appearing black male.  Psych: pleasant and cooperative;  Affect appropriate to situation/ no visual or auditory hallucinations at this time.  Eyes: No scleral injection HENT: No OP obstrucion MSK: no joint deformities.  Cardiovascular: Normal rate and regular rhythm.  Respiratory: Effort normal, non-labored breathing GI: Soft.  No distension. There is no tenderness.  Skin: WDI  Neuro: Mental Status: Patient is awake, alert, oriented to person, place, month, and situation.  Patient is unable to give a clear and coherent history.  No signs of aphasia or neglect   Cranial Nerves: II: Visual Fields are  full. Pupils are equal, round, and reactive to light.    III,IV, VI: EOMI without ptosis or diploplia.  V: Facial sensation is symmetric to temperature VII: Facial movement is symmetric.  VIII: hearing is intact to voice X: Uvula elevates symmetrically XI: Shoulder shrug is symmetric. XII: tongue is midline without atrophy or  fasciculations.   Motor: Tone is normal. Bulk is normal. 5/5 strength was present in all four extremities.   Sensory: Sensation is symmetric to light touch and temperature in the arms and legs.   Deep Tendon Reflexes: 2+ and symmetric in the biceps and patellae.    Plantars: Toes are downgoing bilaterally.     I have reviewed labs in epic and the results pertinent to this consultation are:  Results for DARRYLE, DENNIE (MRN [id number]) as of 12/06/2019 14:52  Ref. Range 12/06/2019 09:10  Sodium Latest Ref Range: 135 - 145 mmol/L 138  Potassium Latest Ref Range: 3.5 - 5.1 mmol/L 3.5  Chloride Latest Ref Range: 98 - 111 mmol/L 103  CO2 Latest Ref Range: 22 - 32 mmol/L 21 (L)  Glucose Latest Ref Range: 70 - 99 mg/dL 87  BUN Latest Ref Range: 6 - 20 mg/dL 25 (H)  Creatinine Latest Ref Range: 0.61 - 1.24 mg/dL 1.02 (H)  Calcium Latest Ref Range: 8.9 - 10.3 mg/dL 9.0  Anion gap Latest Ref Range: 5 - 15  14  Alkaline Phosphatase Latest Ref Range: 38 - 126 U/L 74  Albumin Latest Ref Range: 3.5 - 5.0 g/dL 3.7  AST Latest Ref Range: 15 - 41 U/L 43 (H)  ALT Latest Ref Range: 0 - 44 U/L 25  Total Protein Latest Ref Range: 6.5 - 8.1 g/dL 7.1  Total Bilirubin Latest Ref Range: 0.3 - 1.2 mg/dL 1.1  GFR, Estimated Latest Ref Range: >60 mL/min >60  Lactic Acid, Venous Latest Ref Range: 0.5 - 1.9 mmol/L 2.0 (HH)   No results found for: CKTOTAL, CKMB, CKMBINDEX, TROPONINI   I have reviewed the images obtained:   CT head is pending  Impression: 44 year old male with history outlined above presents for reportedly new seizure activity. No seizure activity seen at time of this exam.  Additionally, neurology is asked to comment on patient's choreiform movements. Suspect this is secondary to antipsychotic withdrawal based on history as provided in psychiatry notes and response to cogentin. No indication for additional workup of these movements at this time  Recommendations: - Head CT to assess  for bleeds or other acute source of irritants; personally reviewed, negative for acute process  - routine EEG, completed and limited secondary to motion artifact but negative - MRI brain to better assess for underlying structural lesions, screening x-rays to clear patient ordered as well - Trend CK to assess for and manage potential rhabdomyolysis  - Increase depakote to 750 mg bid given history of aggressive behavior in context of new seizure activity, and slightly subtherapeutic level on admission for seizure control  - Should patient become febrile overnight, would obtain LP and start meningitic coverage with vancomycin, ceftriaxone and acyclovir; suspect fever documented in the ED was secondary to seizure so will hold for now - We will continue to follow. Thank you for the consult.  - PA Niel Hummer called and spoke with Josephine (POA) [phone]   MD addendum Agree with history, physical, assessment and plan as documented above.  I personally fully evaluated the patient in conjunction with the PA and made clarifications and additions to the note as  necessary  Brooke Dare MD-PhD [facility] [phone]  Addended for charge capture

## 2019-12-06 NOTE — ED Notes (Signed)
Neurologist at bedside. 

## 2019-12-06 NOTE — BH Assessment (Addendum)
TO BE ASSESSED (PENDING MEDICAL CLEARANCE)   Attempted to complete TTS assessment at approximately 1333 and patient appeared to have seizure-like activity 1-2 minutes after starting the assessment. Nursing secretary alerted immediately in order to notify patient's nurse of situation. TTS counselor has requested for nursing staff/EDP (Dr. Rodena Medin) to alert TTS when patient is medically cleared, alert, oriented enough to complete a TTS assessment.

## 2019-12-06 NOTE — ED Notes (Signed)
Pt unable to provide urine. Dr. Kristine Royal notified via chat. No new orders at this time.

## 2019-12-06 NOTE — ED Provider Notes (Signed)
[facility] EMERGENCY DEPARTMENT Provider Note   CSN: [id number] Arrival date & time: 12/06/19  2336     History Chief Complaint  Patient presents with  . Seizures  . Suicidal    Brian Stafford is a 44 y.o. male.  44 year old male with prior medical history as detailed below presents for evaluation.  Patient was at his group home this morning.  EMS was contacted for transport secondary to reported suicidal ideation.  EMT crew responded initially.  After the patient was evaluated and during the initial transport the EMT crew noted activity that could be consistent with possible seizure.  Patient was noted to be moving spasmodically all over.  This lasted approximately 2 minutes.  Reportedly, the patient was not responsive during this event.  Symptoms resolved without medication.  EMT crew contacted paramedic crew who arrived on scene.  Paramedic crew now delivers the patient to the [facility] ED.    Patient does not have prior history of seizure.  Patient is alert and conversant.  He does not appear to be postictal.  He has no injuries noted to his tongue.  He does not appear to have been incontinent.  He does have continued twitching and spasmodic movements of both upper and lower extremities.  This presentation is consistent with prior evaluations.  Documentation from charting suggest that these are likely EPS from patient's medications.  He does report chronic issues with auditory and visual hallucinations.  He describes "demons that live in his room at home."  He reports this examiner that he has been thinking about hurting himself.  He wants to "go to sleep for a long time."  He denies a specific plan of self-harm.  He reports that he has been taking medications as prescribed.  The history is provided by the patient, the EMS personnel and medical records.  Illness Location:  Auditory and visual hallucinations, SI, EPS Severity:  Moderate Onset quality:  Unable to  specify Timing:  Unable to specify Progression:  Unable to specify      Past Medical History:  Diagnosis Date  . Anxiety   . Bipolar 1 disorder (HCC)   . Mental retardation   . Schizophrenia [facility])     Patient Active Problem List   Diagnosis Date Noted  . Schizophrenia (HCC) 09/08/2019  . Aggression 09/08/2019    No past surgical history on file.     No family history on file.  Social History   Tobacco Use  . Smoking status: Not on file  Substance Use Topics  . Alcohol use: Not on file  . Drug use: Not on file    Home Medications Prior to Admission medications   Medication Sig Start Date End Date Taking? Authorizing Provider  benztropine (COGENTIN) 2 MG tablet Take 1 tablet (2 mg total) by mouth daily. 12/04/19   Pricilla Loveless, MD  cloZAPine (CLOZARIL) 100 MG tablet Take 2-4 tablets (200-400 mg total) by mouth See admin instructions. Pt takes 200mg  in am and 400mg  at bedtime 12/04/19   , MD  divalproex (DEPAKOTE ER) 500 MG 24 hr tablet Take 1 tablet (500 mg total) by mouth 2 (two) times daily. 12/04/19   Pricilla Loveless, MD  FLUoxetine (PROZAC) 40 MG capsule Take 1 capsule (40 mg total) by mouth daily. 12/04/19   Pricilla Loveless, MD  Multiple Vitamin (MULTIVITAMIN WITH MINERALS) TABS Take 1 tablet by mouth daily.    [provider]  OLANZapine (ZYPREXA) 10 MG tablet Take 1  tablet (10 mg total) by mouth at bedtime. 12/04/19   Pricilla Loveless, MD  propranolol ER (INDERAL LA) 60 MG 24 hr capsule Take 1 capsule (60 mg total) by mouth daily. 12/04/19   Pricilla Loveless, MD    Allergies    Patient has no known allergies.  Review of Systems   Review of Systems  All other systems reviewed and are negative.   Physical Exam Updated Vital Signs BP (!) 98/49 (BP Location: Right Arm)   Pulse 95   Temp 97.8 F (36.6 C)   Resp 20   SpO2 100%   Physical Exam Vitals and nursing note reviewed.  Constitutional:      General: He is not in acute  distress.    Appearance: He is well-developed.     Comments: Patient with significant twitching of all 4 extremities.  Intermittent clenching of both forearms noted.  Intermittent tongue thrusting.    Patient is alert and conversant.  HENT:     Head: Normocephalic and atraumatic.  Eyes:     Conjunctiva/sclera: Conjunctivae normal.     Pupils: Pupils are equal, round, and reactive to light.  Cardiovascular:     Rate and Rhythm: Normal rate and regular rhythm.     Heart sounds: Normal heart sounds.  Pulmonary:     Effort: Pulmonary effort is normal. No respiratory distress.     Breath sounds: Normal breath sounds.  Abdominal:     General: There is no distension.     Palpations: Abdomen is soft.     Tenderness: There is no abdominal tenderness.  Musculoskeletal:        General: No deformity. Normal range of motion.     Cervical back: Normal range of motion and neck supple.  Skin:    General: Skin is warm and dry.  Neurological:     Mental Status: He is alert and oriented to person, place, and time.  Psychiatric:     Comments: Endorses thoughts of self-harm without specific plan     ED Results / Procedures / Treatments   Labs (all labs ordered are listed, but only abnormal results are displayed) Labs Reviewed  LACTIC ACID, PLASMA - Abnormal; Notable for the following components:      Result Value   Lactic Acid, Venous 2.0 (*)    All other components within normal limits  CBC WITH DIFFERENTIAL/PLATELET - Abnormal; Notable for the following components:   Hemoglobin 11.8 (*)    HCT 36.9 (*)    Neutro Abs 8.5 (*)    Lymphs Abs 0.6 (*)    All other components within normal limits  COMPREHENSIVE METABOLIC PANEL - Abnormal; Notable for the following components:   CO2 21 (*)    BUN 25 (*)    Creatinine, Ser 1.33 (*)    AST 43 (*)    All other components within normal limits  SALICYLATE LEVEL - Abnormal; Notable for the following components:   Salicylate Lvl <7.0 (*)    All  other components within normal limits  ACETAMINOPHEN LEVEL - Abnormal; Notable for the following components:   Acetaminophen (Tylenol), Serum <10 (*)    All other components within normal limits  VALPROIC ACID LEVEL - Abnormal; Notable for the following components:   Valproic Acid Lvl 41 (*)    All other components within normal limits  ETHANOL  URINALYSIS, ROUTINE W REFLEX MICROSCOPIC  RAPID URINE DRUG SCREEN, HOSP PERFORMED  CLOZAPINE (CLOZARIL)    EKG EKG Interpretation  Date/Time:  Friday December 06 2019 08:27:31 EDT Ventricular Rate:  99 PR Interval:    QRS Duration: 91 QT Interval:  361 QTC Calculation: 464 R Axis:   80 Text Interpretation: Sinus rhythm Left atrial enlargement Left ventricular hypertrophy Repol abnrm suggests ischemia, inferior leads Minimal ST elevation, anterolateral leads Baseline wander in lead(s) II III aVF Confirmed by Kristine Royal [phone]) on 12/06/2019 8:35:15 AM   Radiology CT Head Wo Contrast  Result Date: 12/06/2019 CLINICAL DATA:  Seizure. EXAM: CT HEAD WITHOUT CONTRAST TECHNIQUE: Contiguous axial images were obtained from the base of the skull through the vertex without intravenous contrast. COMPARISON:  Brain MRI 12/10/2018 FINDINGS: Brain: Brain volume is normal for age. No intracranial hemorrhage, mass effect, or midline shift. No hydrocephalus. The basilar cisterns are patent. No evidence of territorial infarct or acute ischemia. No extra-axial or intracranial fluid collection. Vascular: No hyperdense vessel or unexpected calcification. Skull: No fracture or focal lesion. Sinuses/Orbits: Complete opacification of left maxillary sinus with heterogeneous material and cortical thickening. Material extends through the ostiomeatal complex which appears widened. There is mucosal thickening and opacification of the left ethmoid air cells and left frontal sinus. Mastoid air cells are clear. Included orbits are unremarkable. Other: None. IMPRESSION: 1. No  acute intracranial abnormality. 2. Chronic left-sided paranasal sinus disease. Consider elective ENT evaluation to exclude the possibility of underlying polyp or obstructing lesion. Electronically Signed   By: Narda Rutherford M.D.   On: 12/06/2019 15:31    Procedures Procedures (including critical care time)  Medications Ordered in ED Medications  sodium chloride 0.9 % bolus 500 mL (has no administration in time range)    ED Course  I have reviewed the triage vital signs and the nursing notes.  Pertinent labs & imaging results that were available during my care of the patient were reviewed by me and considered in my medical decision making (see chart for details).    MDM Rules/Calculators/A&P                         MDM  Screen complete  NOLAWI KANADY was evaluated in Emergency Department on 12/06/2019 for the symptoms described in the history of present illness. He was evaluated in the context of the global COVID-19 pandemic, which necessitated consideration that the patient might be at risk for infection with the SARS-CoV-2 virus that causes COVID-19. Institutional protocols and algorithms that pertain to the evaluation of patients at risk for COVID-19 are in a state of rapid change based on information released by regulatory bodies including the CDC and federal and state organizations. These policies and algorithms were followed during the patient's care in the ED.  Patient is presenting for evaluation of reported SI and hallucinations.  Patient without observed symptoms of likely seizure. Patient does have significant EPS. These do not appear to be new.  Patient is mildly dehydrated. This is treated with IV fluids.  At this time patient is without acute medical pathology that would interfere with psychiatric evaluation and treatment.  TTS consulted for evaluation of his reported SI, hallucinations, and EPS.  1330 -- TTS was initiating telemetry consult.  Patient was  observed to have 1 to 2 minutes of generalized tonic-clonic seizure-like activity.  Observed seizure activity stopped without intervention.  He was postictal immediately following this event.  Keppra load initiated.  Neuro [facility]) consult initiated.  Patient will now require further evaluation for likely new onset seizure.    Hospital service will evaluate for admission.  Final Clinical Impression(s) / ED Diagnoses Final diagnoses:  Suicidal ideation  Seizure (HCC)  Schizophrenia, unspecified type [facility](HCC)    Rx / DC Orders ED Discharge Orders    None       Wynetta FinesMessick, Monserath Neff C, MD 12/06/19 [phone]

## 2019-12-06 NOTE — BH Assessment (Signed)
RN Odis Luster notified that a psych consult would need to be ordered for this patient, as he was admitted to inpatient telemetry unit.

## 2019-12-07 ENCOUNTER — Encounter (HOSPITAL_COMMUNITY): Payer: Self-pay | Admitting: Internal Medicine

## 2019-12-07 ENCOUNTER — Inpatient Hospital Stay (HOSPITAL_COMMUNITY): Payer: 59

## 2019-12-07 LAB — COMPREHENSIVE METABOLIC PANEL
ALT: 21 U/L (ref 0–44)
AST: 33 U/L (ref 15–41)
Albumin: 3 g/dL — ABNORMAL LOW (ref 3.5–5.0)
Alkaline Phosphatase: 63 U/L (ref 38–126)
Anion gap: 13 (ref 5–15)
BUN: 20 mg/dL (ref 6–20)
CO2: 22 mmol/L (ref 22–32)
Calcium: 8.2 mg/dL — ABNORMAL LOW (ref 8.9–10.3)
Chloride: 107 mmol/L (ref 98–111)
Creatinine, Ser: 1.16 mg/dL (ref 0.61–1.24)
GFR, Estimated: >60 mL/min (ref >60)
Glucose, Bld: 84 mg/dL (ref 70–99)
Potassium: 3.4 mmol/L — ABNORMAL LOW (ref 3.5–5.1)
Sodium: 142 mmol/L (ref 135–145)
Total Bilirubin: 0.8 mg/dL (ref 0.3–1.2)
Total Protein: 6.1 g/dL — ABNORMAL LOW (ref 6.5–8.1)

## 2019-12-07 LAB — CBC WITH DIFFERENTIAL/PLATELET
Abs Immature Granulocytes: 0.02 10*3/uL (ref 0.00–0.07)
Basophils Absolute: 0 10*3/uL (ref 0.0–0.1)
Basophils Relative: 1 %
Eosinophils Absolute: 0 10*3/uL (ref 0.0–0.5)
Eosinophils Relative: 0 %
HCT: 33.8 % — ABNORMAL LOW (ref 39.0–52.0)
Hemoglobin: 10.5 g/dL — ABNORMAL LOW (ref 13.0–17.0)
Immature Granulocytes: 1 %
Lymphocytes Relative: 18 %
Lymphs Abs: 0.8 10*3/uL (ref 0.7–4.0)
MCH: 27.3 pg (ref 26.0–34.0)
MCHC: 31.1 g/dL (ref 30.0–36.0)
MCV: 87.8 fL (ref 80.0–100.0)
Monocytes Absolute: 0.4 10*3/uL (ref 0.1–1.0)
Monocytes Relative: 10 %
Neutro Abs: 3.1 10*3/uL (ref 1.7–7.7)
Neutrophils Relative %: 70 %
Platelets: 244 10*3/uL (ref 150–400)
RBC: 3.85 MIL/uL — ABNORMAL LOW (ref 4.22–5.81)
RDW: 12.9 % (ref 11.5–15.5)
WBC: 4.3 10*3/uL (ref 4.0–10.5)
nRBC: 0 % (ref 0.0–0.2)

## 2019-12-07 LAB — POCT I-STAT 7, (LYTES, BLD GAS, ICA,H+H)
Acid-base deficit: 2 mmol/L (ref 0.0–2.0)
Bicarbonate: 25.2 mmol/L (ref 20.0–28.0)
Calcium, Ion: 1.21 mmol/L (ref 1.15–1.40)
HCT: 32 % — ABNORMAL LOW (ref 39.0–52.0)
Hemoglobin: 10.9 g/dL — ABNORMAL LOW (ref 13.0–17.0)
O2 Saturation: 100 %
Patient temperature: 97.6
Potassium: 3.1 mmol/L — ABNORMAL LOW (ref 3.5–5.1)
Sodium: 142 mmol/L (ref 135–145)
TCO2: 27 mmol/L (ref 22–32)
pCO2 arterial: 50.3 mmHg — ABNORMAL HIGH (ref 32.0–48.0)
pH, Arterial: 7.305 — ABNORMAL LOW (ref 7.350–7.450)
pO2, Arterial: 321 mmHg — ABNORMAL HIGH (ref 83.0–108.0)

## 2019-12-07 LAB — CK: Total CK: 848 U/L — ABNORMAL HIGH (ref 49–397)

## 2019-12-07 LAB — TROPONIN I (HIGH SENSITIVITY): Troponin I (High Sensitivity): 5 ng/L (ref <18)

## 2019-12-07 LAB — RAPID URINE DRUG SCREEN, HOSP PERFORMED
Amphetamines: NOT DETECTED
Barbiturates: NOT DETECTED
Benzodiazepines: NOT DETECTED
Cocaine: NOT DETECTED
Opiates: NOT DETECTED
Tetrahydrocannabinol: NOT DETECTED

## 2019-12-07 LAB — MAGNESIUM: Magnesium: 2.6 mg/dL — ABNORMAL HIGH (ref 1.7–2.4)

## 2019-12-07 LAB — LACTIC ACID, PLASMA: Lactic Acid, Venous: 0.8 mmol/L (ref 0.5–1.9)

## 2019-12-07 MED ORDER — VALPROATE SODIUM 500 MG/5ML IV SOLN
500.0000 mg | Freq: Every day | INTRAVENOUS | Status: DC
  Administered 2019-12-07: 500 mg via INTRAVENOUS
  Filled 2019-12-07 (×2): qty 5

## 2019-12-07 MED ORDER — VALPROATE SODIUM 500 MG/5ML IV SOLN
375.0000 mg | INTRAVENOUS | Status: DC
  Administered 2019-12-07 – 2019-12-08 (×2): 375 mg via INTRAVENOUS
  Filled 2019-12-07 (×3): qty 3.75

## 2019-12-07 MED ORDER — NALOXONE HCL 0.4 MG/ML IJ SOLN: 0.4000 mg | Freq: Once | INTRAMUSCULAR | Status: AC

## 2019-12-07 MED ORDER — BENZTROPINE MESYLATE 1 MG/ML IJ SOLN
2.0000 mg | Freq: Every day | INTRAMUSCULAR | Status: DC
  Administered 2019-12-07 – 2019-12-14 (×8): 2 mg via INTRAVENOUS
  Filled 2019-12-07 (×8): qty 2

## 2019-12-07 MED ORDER — DIVALPROEX SODIUM ER 500 MG PO TB24
500.0000 mg | ORAL_TABLET | Freq: Every day | ORAL | Status: DC
  Filled 2019-12-07: qty 1

## 2019-12-07 MED ORDER — NALOXONE HCL 0.4 MG/ML IJ SOLN
INTRAMUSCULAR | Status: AC
  Filled 2019-12-07: qty 1

## 2019-12-07 MED ORDER — NALOXONE HCL 0.4 MG/ML IJ SOLN
INTRAMUSCULAR | Status: AC
  Administered 2019-12-07: 0.4 mg via INTRAVENOUS
  Filled 2019-12-07: qty 1

## 2019-12-07 MED ORDER — LORAZEPAM 2 MG/ML IJ SOLN: 1.0000 mg | Freq: Four times a day (QID) | INTRAMUSCULAR | Status: DC | PRN

## 2019-12-07 MED ORDER — FLUMAZENIL 0.5 MG/5ML IV SOLN
INTRAVENOUS | Status: AC
  Filled 2019-12-07: qty 5

## 2019-12-07 MED ORDER — FLUMAZENIL 0.5 MG/5ML IV SOLN
0.2000 mg | Freq: Once | INTRAVENOUS | Status: AC
  Administered 2019-12-07: 0.2 mg via INTRAVENOUS

## 2019-12-07 MED ORDER — OLANZAPINE 5 MG PO TBDP
10.0000 mg | ORAL_TABLET | Freq: Every day | ORAL | Status: DC
  Filled 2019-12-07: qty 2

## 2019-12-07 MED ORDER — DIVALPROEX SODIUM ER 500 MG PO TB24
750.0000 mg | ORAL_TABLET | Freq: Every day | ORAL | Status: DC
  Filled 2019-12-07: qty 1

## 2019-12-07 NOTE — Progress Notes (Signed)
Patient's father Brian Stafford @ [phone] called for update on patient's condition. Stated that he would call back tomorrow for additional information.

## 2019-12-07 NOTE — Progress Notes (Signed)
Notified MD (Lindzen, E) of patient not following commands, lethargic, pupils pinpoint, minimal air moment in lungs and and no arouses to pain.Notified MD of patient receiving ativan 2mg  in MRI.  MD aware and will insert orders and to follow up.. Rn will continue to monitor closely.

## 2019-12-07 NOTE — Progress Notes (Signed)
HOSPITAL MEDICINE OVERNIGHT EVENT NOTE    Notified by rapid response that patient developed rapid development of decreased responsiveness shortly after receiving a dose of Ativan. Dr. Otelia Limes with neurology was initially called recommended providing patient with a dose of 0.2 mg of IV flumazenil although patient did not respond.  I went to evaluate the patient at the bedside who was minimally responsive and taking shallow breaths. That being said, patient showed evidence of protecting his airway.  Pupils are very constricted on examination.  Heart sounds were regular, heart rate controlled. Breath sounds reveal diffuse bilateral rhonchi.    Patient noted to be hemodynamically stable, saturating at 100%.  ABG obtained revealing mild respiratory acidosis with pH of 7.3, PCO2 of 50, PO2 of 321 (on NRB at time of ABG).    Patient underwent MRI brain just prior to the onset of his symptoms that did not show any acute disease with exception of sinusitis.    Attempted to provide patient with Narcan x2 doses without response.  Obtaining repeat lactic acid, troponin, CBC, chemistry.  Considering patient is protecting his airway we will monitor patient closely in the progressive unit, provide oxygen support as needed, monitor for any seizure activity, follow-up on labs.  Marinda Elk  MD [facility]

## 2019-12-07 NOTE — Progress Notes (Signed)
PROGRESS NOTE    Brian CosKenneth H Harrel  ZOX:[id number] DOB: 1976-01-09 DOA: 12/06/2019 PCP: Fleet ContrasAvbuere, Edwin, MD   Brief Narrative:  Brian Stafford is a 44 y.o. male with medical history significant of schizophrenia, bipolar disorder, mild electro disability disorder, anxiety, presented with new onset of seizures. Patient remained confused probably from seizure and postictal state, most history obtained from ED staff and review of patient's chart. Patient started to have visual and auditory hallucination "seeing and hearing from demons" 3 days ago and came to ED for evaluation.  Patient moved to live in a couple 3 to 4 months ago, and has had since experiencing twitching like movement of lips and tongue as well as uncontrolled movements of his legs.  Patient was evaluated by psychiatrist 2 days ago in the ED, who found out that patient has been of psychiatry medication for last 3 months, at the same time, it was found the patient has involuntary tongue movement, which was improved after given 1 dose of Cogentin 1 mg IM. Patient was sent back to group home and starting psychiatric medications. However, it appears that the patient continues to have bizarre movement and hallucinations, group home called EMS this morning. EMS arrived and found the patient had seizure with whole body shaking for about 2 minutes. Patient had another witnessed seizure in the ED witnessed by the nurse, with brief episode of hypoxia oxygenation dropped to 70s. Whole episode lasted about a minute. CT head negative for intracranial acute etiology. Blood work, WBC 10, UDS negative, UA negative for UTI.  Rapid response overnight soon after admission - obtunded after ativan - no improvement with narcan or flumazanil but he is protecting airway - no intubation indicated.   Assessment & Plan:   Active Problems:   Seizure (HCC)   New onset seizure, unclear etiology, POA -Keppra loading completed in the ED -EEG pending -Neurology  recommend continue Depakote 500 mg twice daily -unfortunately patient remains somewhat somnolent questionable in setting of polypharmacy versus postictal state pharmacy consulted to transition to p.o. equivalent dose -CT head negative  Acute encephalopathy, rule out metabolic versus toxic etiology  Likely postictal given above, concern for polypharmacy however patient did not respond to flumazenil or Narcan Continue supportive care, imaging negative thus far  Involuntary body movement -Chorea vs extrapyramidal syndrome? Psychiatrist suspect extrapyramidal syndrome 3 days ago, appeared to respond to Cogentin -Unclear if patient was taking his medications at previous facility, I discussed with pharmacy will continue IV medications as tolerated but hold clozapine and Geodon for now. -Continue Cogentin if able to tolerate p.o. safely  Dehydration, poor p.o. intake with lactic acidosis -Continue IV fluids, supportive care  Schizophrenia -Psychiatry on board, on antipsychiatric medications -clozapine and Geodon at intake -Concern for polypharmacy or even toxic ingestion given symptoms as above, defer to psychiatry for weaning or increasing patient's doses per their expertise -Certainly while patient is n.p.o. and unable to take either Geodon or clozapine if he improves and subsequently becomes more altered after restarting his home medications will likely need to discontinue or decrease doses given his high dose of clozapine reported at intake    DVT prophylaxis: Lovenox Code Status: Full code Family Communication: None available  Status is: Inpatient  Dispo: The patient is from: Group home              Anticipated d/c is to: Same              Anticipated d/c date is: 3 to 5 days  pending clinical course              Patient currently not medically stable for discharge  Consultants:   Psychiatry, neurology  Procedures:   None indicated  Antimicrobials:  None  indicated  Subjective: No acute issues or events overnight, patient remains somewhat somnolent despite additional dose of flumazenil and Narcan, at this time but review of systems is otherwise unable to be obtained - he is maintaining airway but is not following commands and will be kept n.p.o. in the meantime  Objective: Vitals:   12/07/19 0445 12/07/19 0500 12/07/19 0515 12/07/19 0530  BP:  112/67  120/73  Pulse: 86 86 83 88  Resp: 15 15 17  (!) 28  Temp:    98.7 F (37.1 C)  TempSrc:    Axillary  SpO2: 100% 100% 100% 100%  Weight:      Height:        Intake/Output Summary (Last 24 hours) at 12/07/2019 0751 Last data filed at 12/07/2019 0600 Gross per 24 hour  Intake 1512.5 ml  Output 1350 ml  Net 162.5 ml   Filed Weights   12/06/19 2052  Weight: 74.5 kg    Examination:  General exam: Appears calm and comfortable  Respiratory system: Clear to auscultation. Respiratory effort normal. Cardiovascular system: S1 & S2 heard, RRR. No JVD, murmurs, rubs, gallops or clicks. No pedal edema. Gastrointestinal system: Abdomen is nondistended, soft and nontender. No organomegaly or masses felt. Normal bowel sounds heard. Central nervous system: Unable to follow commands Extremities: Without erythema or edema Skin: No rashes, lesions or ulcers Psychiatry: Unable to evaluate given somnolence    Data Reviewed: I have personally reviewed following labs and imaging studies  CBC: Recent Labs  Lab 12/03/19 1423 12/06/19 0910 12/07/19 0255 12/07/19 0505  WBC 7.3 10.0  --  4.3  NEUTROABS 5.6 8.5*  --  3.1  HGB 11.6* 11.8* 10.9* 10.5*  HCT 35.9* 36.9* 32.0* 33.8*  MCV 84.9 84.6  --  87.8  PLT 304 322  --  244   Basic Metabolic Panel: Recent Labs  Lab 12/03/19 1423 12/06/19 0910 12/07/19 0255 12/07/19 0505  NA 141 138 142 142  K 4.0 3.5 3.1* 3.4*  CL 109 103  --  107  CO2 18* 21*  --  22  GLUCOSE 83 87  --  84  BUN 24* 25*  --  20  CREATININE 1.20 1.33*  --  1.16   CALCIUM 9.3 9.0  --  8.2*  MG  --   --   --  2.6*   GFR: Estimated Creatinine Clearance: 87.4 mL/min (by C-G formula based on SCr of 1.16 mg/dL). Liver Function Tests: Recent Labs  Lab 12/03/19 1423 12/06/19 0910 12/07/19 0505  AST 24 43* 33  ALT 15 25 21   ALKPHOS 76 74 63  BILITOT 0.8 1.1 0.8  PROT 7.8 7.1 6.1*  ALBUMIN 4.2 3.7 3.0*   No results for input(s): LIPASE, AMYLASE in the last 168 hours. No results for input(s): AMMONIA in the last 168 hours. Coagulation Profile: No results for input(s): INR, PROTIME in the last 168 hours. Cardiac Enzymes: Recent Labs  Lab 12/06/19 2216 12/07/19 0505  CKTOTAL 1,084* 848*   BNP (last 3 results) No results for input(s): PROBNP in the last 8760 hours. HbA1C: No results for input(s): HGBA1C in the last 72 hours. CBG: No results for input(s): GLUCAP in the last 168 hours. Lipid Profile: No results for input(s): CHOL, HDL, LDLCALC, TRIG, CHOLHDL,  LDLDIRECT in the last 72 hours. Thyroid Function Tests: No results for input(s): TSH, T4TOTAL, FREET4, T3FREE, THYROIDAB in the last 72 hours. Anemia Panel: No results for input(s): VITAMINB12, FOLATE, FERRITIN, TIBC, IRON, RETICCTPCT in the last 72 hours. Sepsis Labs: Recent Labs  Lab 12/06/19 0910 12/06/19 1150 12/06/19 2218 12/07/19 0506  LATICACIDVEN 2.0* 1.4 1.3 0.8    Recent Results (from the past 240 hour(s))  Respiratory Panel by RT PCR (Flu A&B, Covid) - Nasopharyngeal Swab     Status: None   Collection Time: 12/03/19  2:30 PM   Specimen: Nasopharyngeal Swab  Result Value Ref Range Status   SARS Coronavirus 2 by RT PCR NEGATIVE NEGATIVE Final    Comment: (NOTE) SARS-CoV-2 target nucleic acids are NOT DETECTED.  The SARS-CoV-2 RNA is generally detectable in upper respiratoy specimens during the acute phase of infection. The lowest concentration of SARS-CoV-2 viral copies this assay can detect is 131 copies/mL. A negative result does not preclude  SARS-Cov-2 infection and should not be used as the sole basis for treatment or other patient management decisions. A negative result may occur with  improper specimen collection/handling, submission of specimen other than nasopharyngeal swab, presence of viral mutation(s) within the areas targeted by this assay, and inadequate number of viral copies (<131 copies/mL). A negative result must be combined with clinical observations, patient history, and epidemiological information. The expected result is Negative.  Fact Sheet for Patients:  https://www.moore.com/  Fact Sheet for Healthcare Providers:  https://www.young.biz/  This test is no t yet approved or cleared by the [place] FDA and  has been authorized for detection and/or diagnosis of SARS-CoV-2 by FDA under an Emergency Use Authorization (EUA). This EUA will remain  in effect (meaning this test can be used) for the duration of the COVID-19 declaration under Section 564(b)(1) of the Act, 21 U.S.C. section 360bbb-3(b)(1), unless the authorization is terminated or revoked sooner.     Influenza A by PCR NEGATIVE NEGATIVE Final   Influenza B by PCR NEGATIVE NEGATIVE Final    Comment: (NOTE) The Xpert Xpress SARS-CoV-2/FLU/RSV assay is intended as an aid in  the diagnosis of influenza from Nasopharyngeal swab specimens and  should not be used as a sole basis for treatment. Nasal washings and  aspirates are unacceptable for Xpert Xpress SARS-CoV-2/FLU/RSV  testing.  Fact Sheet for Patients: https://www.moore.com/  Fact Sheet for Healthcare Providers: https://www.young.biz/  This test is not yet approved or cleared by the [place] FDA and  has been authorized for detection and/or diagnosis of SARS-CoV-2 by  FDA under an Emergency Use Authorization (EUA). This EUA will remain  in effect (meaning this test can be used) for the duration of the   Covid-19 declaration under Section 564(b)(1) of the Act, 21  U.S.C. section 360bbb-3(b)(1), unless the authorization is  terminated or revoked. Performed at [facility], [address] [place], [place] [address]          Radiology Studies: DG Chest 1 View  Result Date: 12/06/2019 CLINICAL DATA:  Seizure EXAM: CHEST  1 VIEW COMPARISON:  04/04/2017 FINDINGS: The heart size and mediastinal contours are within normal limits. Both lungs are clear. The visualized skeletal structures are unremarkable. IMPRESSION: No active disease. Electronically Signed   By: Marlan Palau M.D.   On: 12/06/2019 19:04   DG Abd 1 View  Result Date: 12/06/2019 CLINICAL DATA:  Seizure EXAM: ABDOMEN - 1 VIEW COMPARISON:  None. FINDINGS: Nonobstructive bowel gas pattern. No organomegaly or free  air visualized. Study limited by patient motion and rotation. IMPRESSION: No acute findings. Electronically Signed   By: Charlett Nose M.D.   On: 12/06/2019 20:55   CT Head Wo Contrast  Result Date: 12/06/2019 CLINICAL DATA:  Seizure. EXAM: CT HEAD WITHOUT CONTRAST TECHNIQUE: Contiguous axial images were obtained from the base of the skull through the vertex without intravenous contrast. COMPARISON:  Brain MRI 12/10/2018 FINDINGS: Brain: Brain volume is normal for age. No intracranial hemorrhage, mass effect, or midline shift. No hydrocephalus. The basilar cisterns are patent. No evidence of territorial infarct or acute ischemia. No extra-axial or intracranial fluid collection. Vascular: No hyperdense vessel or unexpected calcification. Skull: No fracture or focal lesion. Sinuses/Orbits: Complete opacification of left maxillary sinus with heterogeneous material and cortical thickening. Material extends through the ostiomeatal complex which appears widened. There is mucosal thickening and opacification of the left ethmoid air cells and left frontal sinus. Mastoid air cells are clear. Included orbits are  unremarkable. Other: None. IMPRESSION: 1. No acute intracranial abnormality. 2. Chronic left-sided paranasal sinus disease. Consider elective ENT evaluation to exclude the possibility of underlying polyp or obstructing lesion. Electronically Signed   By: Narda Rutherford M.D.   On: 12/06/2019 15:31   MR BRAIN WO CONTRAST  Result Date: 12/07/2019 CLINICAL DATA:  Initial evaluation for acute seizure. EXAM: MRI HEAD WITHOUT CONTRAST TECHNIQUE: Multiplanar, multiecho pulse sequences of the brain and surrounding structures were obtained without intravenous contrast. COMPARISON:  Prior CT from 12/06/2019. FINDINGS: Brain: Cerebral volume within normal limits for age. No focal parenchymal signal abnormality. No abnormal foci of restricted to suggest acute or subacute ischemia or changes related to seizure. Gray-white matter differentiation maintained. No encephalomalacia gliosis to suggest chronic cortical infarction or other insult. No foci of susceptibility artifact to suggest acute or chronic intracranial hemorrhage. No mass lesion, midline shift or mass effect. No hydrocephalus or extra-axial fluid collection. Pituitary gland suprasellar region normal. Midline structures intact. No intrinsic temporal lobe abnormality. Vascular: Major intracranial vascular flow voids are well maintained. Skull and upper cervical spine: Craniocervical junction within normal limits. Bone marrow signal intensity normal. No scalp soft tissue abnormality. Sinuses/Orbits: Globes and orbital soft tissues within normal limits. Extensive left frontal, ethmoidal, and maxillary sinusitis, chronic in appearance. No mastoid effusion. Inner ear structures within normal limits. Other: None. IMPRESSION: 1. Normal brain MRI. No acute intracranial abnormality identified. 2. Extensive left-sided paranasal sinusitis, chronic in appearance. Electronically Signed   By: Rise Mu M.D.   On: 12/07/2019 02:15   DG Pelvis Portable  Result  Date: 12/06/2019 CLINICAL DATA:  Seizure EXAM: PORTABLE PELVIS 1-2 VIEWS COMPARISON:  None. FINDINGS: Hip joints and SI joints symmetric and unremarkable. No acute bony abnormality. Specifically, no fracture, subluxation, or dislocation. IMPRESSION: No acute bony abnormality. Electronically Signed   By: Charlett Nose M.D.   On: 12/06/2019 20:55   EEG adult  Result Date: 12/06/2019 Charlsie Quest, MD     12/06/2019  6:10 PM Patient Name: YOSEPH HAILE MRN: [id number] Epilepsy Attending: Charlsie Quest Referring Physician/Provider: Dr Clemon Chambers Date: 12/06/2019 Duration: 30.07 mins Patient history: 44 year old male with new seizure activity. EEG to evaluate for seizure. Level of alertness: Awake, asleep AEDs during EEG study: VPA Technical aspects: This EEG study was done with scalp electrodes positioned according to the 10-20 International system of electrode placement. Electrical activity was acquired at a sampling rate of 500Hz  and reviewed with a high frequency filter of 70Hz  and a low frequency filter  of 1Hz . EEG data were recorded continuously and digitally stored. Description: The posterior dominant rhythm consists of 9 Hz activity of moderate voltage (25-35 uV) seen predominantly in posterior head regions, symmetric and reactive to eye opening and eye closing. Sleep was characterized by vertex waves, sleep spindles (12 to 14 Hz), maximal frontocentral region.   Hyperventilation and photic stimulation were not performed.   Of note, parts of eeg were difficult to interpret due to significant movement artifact. IMPRESSION: This technically difficult study study is within normal limits. No seizures or epileptiform discharges were seen throughout the recording. Priyanka '  Scheduled Meds: . benztropine  2 mg Oral Daily  . cloZAPine  200 mg Oral Daily   And  . cloZAPine  400 mg Oral QHS  . divalproex  750 mg Oral BID  . enoxaparin (LOVENOX) injection  40 mg Subcutaneous Q24H  .  flumazenil      . FLUoxetine  40 mg Oral Daily  . multivitamin with minerals  1 tablet Oral Daily  . OLANZapine  10 mg Oral QHS  . propranolol ER  60 mg Oral Daily   Continuous Infusions: . sodium chloride 150 mL/hr at 12/07/19 0328     LOS: 1 day   Time spent: 13/06/21  , DO [facility]  If 7PM-7AM, please contact night-coverage [email or web address]  12/07/2019, 7:51 AM

## 2019-12-07 NOTE — Progress Notes (Signed)
MD Shalhoub, G. At bedside. Notified MD of patient lethargic/obtunded, withdrawing to painful stimuli, pupils pinpoint and non-reactive; additionally concerend for patient airway protection. MD review chart, verbal order for additional narcan dose (administered). Md ordered ABG and labs. Rn will continue to closely monitor. No further order from orders from MD.

## 2019-12-07 NOTE — Plan of Care (Signed)
  Problem: Education: Goal: Expressions of having a comfortable level of knowledge regarding the disease process will increase Outcome: Progressing   Problem: Clinical Measurements: Goal: Ability to maintain clinical measurements within normal limits will improve Outcome: Progressing Goal: Will remain free from infection Outcome: Progressing Goal: Diagnostic test results will improve Outcome: Progressing Goal: Respiratory complications will improve Outcome: Progressing Goal: Cardiovascular complication will be avoided Outcome: Progressing   Problem: Coping: Goal: Level of anxiety will decrease Outcome: Progressing   Problem: Elimination: Goal: Will not experience complications related to urinary retention Outcome: Progressing   Problem: Pain Managment: Goal: General experience of comfort will improve Outcome: Progressing   Problem: Safety: Goal: Ability to remain free from injury will improve Outcome: Progressing   Problem: Skin Integrity: Goal: Risk for impaired skin integrity will decrease Outcome: Progressing   Problem: Health Behavior/Discharge Planning: Goal: Compliance with prescribed medication regimen will improve Outcome: Progressing   Problem: Clinical Measurements: Goal: Complications related to the disease process, condition or treatment will be avoided or minimized Outcome: Progressing Goal: Diagnostic test results will improve Outcome: Progressing

## 2019-12-07 NOTE — Progress Notes (Addendum)
Patient is alert now and he's asking for food but no diet ordered. Notified Blount NP and received cardiac diet  Order.

## 2019-12-07 NOTE — Progress Notes (Signed)
Initial Nutrition Assessment  DOCUMENTATION CODES:   Not applicable  INTERVENTION:  Monitor for diet advancement, will provide nutrition supplements as appropriate.   NUTRITION DIAGNOSIS:   Inadequate oral intake related to lethargy/confusion as evidenced by NPO status.    GOAL:   Patient will meet greater than or equal to 90% of their needs    MONITOR:   Labs, I & O's, Diet advancement, Skin, Weight trends, PO intake  REASON FOR ASSESSMENT:   Malnutrition Screening Tool    ASSESSMENT:  RD working remotely.  44 year old male with history significant of schizophrenia, bipolar disorder, mild electro disability disorder, and anxiety who was recently seen in ED 3 days prior due to visual and auditory hallucinations as well as twitching movements of lips, tongue, and legs. Pt evaluated by psychiatry and found that he had been off of psychiatry medication x 3 months, involuntary movements improved s/p IM Cogentin and discharged back to group home. Pt presents with new onset seizures.  On 11/5 pt received 2mg  ativan IV prior to MRI, noted highly sedated, unresponsive to stimuli post MRI, did not require intubation. Fluctuating alertness today, RD unable to obtain nutrition history at this time. He is currently NPO after failed BSE this afternoon secondary to poor level of attention, alertness, and inability to follow commands. Noted good prognosis for safe diet advancement. Will continue to monitor.   Limited recent wt history for review. Per chart, weights have trended down ~31 lbs (15.8%) in the last 11 months which is concerning. Pt weighed 88.5 kg on 09/08/19 as well as at office visit on 02/19/19. Will plan to complete exam at follow-up.  Medications reviewed and include: Depakote, Prozac, MVI, Inderal  02/21/19 @ 150 ml/hr  Labs: K 3.4 (L) trending up, Mg 2.6 (H)  Per notes: -CT/MRI unremarkable -chorea vs extrapyramidal syndrome? -EEG within normal limits -psych  consult   NUTRITION - FOCUSED PHYSICAL EXAM: Unable to complete at this time, RD working remotely.  Diet Order:   Diet Order    None      EDUCATION NEEDS:   No education needs have been identified at this time  Skin:  Skin Assessment: Reviewed RN Assessment  Last BM:  pta  Height:   Ht Readings from Last 1 Encounters:  12/06/19 6\' 2"  (1.88 m)    Weight:   Wt Readings from Last 1 Encounters:  12/06/19 74.5 kg    BMI:  Body mass index is 21.09 kg/m.  Estimated Nutritional Needs:   Kcal:  2200-2400  Protein:  115-125  Fluid:  >/= 2.2 L   , RD, LDN Clinical Nutrition After Hours/Weekend Pager # in Amion

## 2019-12-07 NOTE — Significant Event (Signed)
Rapid Response Event Note   Reason for Call : unresponsiveness  Initial Focused Assessment:  Pt obtunded following ativan for MRI. Pt was temporarily supported and MD contacted. Romazicon ordered and given. Pt is arousable to noxious stimuli. Pt placed on NRB mask.    Interventions:  -Romazicon IV -ABG   Plan of Care:  -Monitor mental status -Notify primary svc and/or RRRN for further assistance    MD Notified: Dr. Leafy Half came to bedside. Call Time: 0209 Arrival Time: 0212 End Time: 0300  Rose Fillers, RN

## 2019-12-07 NOTE — Progress Notes (Signed)
Contacted provider re: changing route of meds due to patient not being cleared by speech.

## 2019-12-07 NOTE — Progress Notes (Signed)
Patient with decreased responsiveness and respirations after receiving Ativan. Flumazenil 0.2 mg IV ordered. Primary team is being called to assess. May need intubation. RN advised to call back if the flumazenil precipitates a seizure, which is a known side effect.   Electronically signed: Dr. Caryl Pina

## 2019-12-07 NOTE — Evaluation (Signed)
Clinical/Bedside Swallow Evaluation Patient Details  Name: Brian Stafford MRN: [id number] Date of Birth: 1975/09/10  Today's Date: 12/07/2019 Time: SLP Start Time (ACUTE ONLY): 1105 SLP Stop Time (ACUTE ONLY): 1125 SLP Time Calculation (min) (ACUTE ONLY): 20 min  Past Medical History:  Past Medical History:  Diagnosis Date  . Anxiety   . Bipolar 1 disorder (HCC)   . Mental retardation   . Schizophrenia [facility])    Past Surgical History: History reviewed. No pertinent surgical history. HPI:  Patient is a 44 y.o. male with PMH: schizophrenia, bipolar disorder, anxiety, intellectual disability, who presented to hospital from group home where he lives with new onset seizures which were witnessed by group home staff. UA negative for UTI, CT head negative for intracranial acute etiology. In hospital, he had decreased responsiveness and respirations after receiving Ativan but did not require intubation.   Assessment / Plan / Recommendation Clinical Impression  Patient presents with what appears to be a primary cognitive-based dysphagia. Patient with flucutating alertness but was able to verbally respond at times to RN and SLP. He had very poor awareness to cup/straw/spoon even with verbal and tactile cues (he kept eyes closed). Just prior to oral care, patient opened mouth slightly and clear saliva leaked out his mouth. He did not demonstrate any coughing or throat clearing but did swallow 2-3 times with each sip and had suspected prolonged oral transit with liquids and puree solids. Patient currently not safe for PO's secondary to his poor level of attention, alertness, inability to follow commands. SLP Visit Diagnosis: Dysphagia, unspecified (R13.10)    Aspiration Risk  Mild aspiration risk;Moderate aspiration risk    Diet Recommendation NPO   Medication Administration: Via alternative means    Other  Recommendations Oral Care Recommendations: Oral care QID;Staff/trained caregiver to  provide oral care   Follow up Recommendations Other (comment) (TBD)      Frequency and Duration min 2x/week  1 week       Prognosis Prognosis for Safe Diet Advancement: Good      Swallow Study   General Date of Onset: 12/06/19 HPI: Patient is a 44 y.o. male with PMH: schizophrenia, bipolar disorder, anxiety, intellectual disability, who presented to hospital from group home where he lives with new onset seizures which were witnessed by group home staff. UA negative for UTI, CT head negative for intracranial acute etiology. In hospital, he had decreased responsiveness and respirations after receiving Ativan but did not require intubation. Type of Study: Bedside Swallow Evaluation Previous Swallow Assessment: None Diet Prior to this Study: NPO Temperature Spikes Noted: No History of Recent Intubation: No Behavior/Cognition: Confused;Doesn't follow directions;Uncooperative;Lethargic/Drowsy;Distractible Oral Cavity Assessment: Other (comment) (unable to perform due to patient's inability to participate fully) Oral Care Completed by SLP: Other (Comment) (unable to perform full oral care) Oral Cavity - Dentition: Adequate natural dentition Self-Feeding Abilities: Total assist Patient Positioning: Upright in bed Baseline Vocal Quality: Normal Volitional Cough: Cognitively unable to elicit Volitional Swallow: Unable to elicit    Oral/Motor/Sensory Function Overall Oral Motor/Sensory Function: Other (comment) (did not participate in formal eval, but appears WFL)   Ice Chips     Thin Liquid Thin Liquid: Impaired Oral Phase Impairments: Poor awareness of bolus Oral Phase Functional Implications: Oral holding;Oral residue;Prolonged oral transit Pharyngeal  Phase Impairments: Multiple swallows    Nectar Thick     Honey Thick     Puree Puree: Impaired Oral Phase Impairments: Poor awareness of bolus Oral Phase Functional Implications: Prolonged oral transit Pharyngeal Phase  Impairments:  Multiple swallows   Solid     Solid: Not tested      Angela Nevin, MA, CCC-SLP Speech Therapy [facility]

## 2019-12-07 NOTE — TOC Progression Note (Deleted)
Note placed in error

## 2019-12-07 NOTE — Progress Notes (Addendum)
Neurology Progress Note  Patient ID: Brian Stafford is a 43 y.o. with PMHx of  has a past medical history of Anxiety, Bipolar 1 disorder (HCC), Mental retardation, and Schizophrenia (HCC). he was brought to the ED by ambulance on 12/06/2019 for multiple witnessed tonic clonic seizure activity. At time of admission he was on cogentin, depakote 500mg  bid, prozac, and clozaril.     Initially consulted for: seizure  Major interval events:  12/06/2019 received 2mg  ativan IV prior to MRI. Post MRI became highly sedated, unresponsive to stimuli, not fully responsive to romazicon. Not needing intubation. MRI unremarkable.   Subjective: lethargic  Exam: Vitals:   12/07/19 0515 12/07/19 0530  BP:  120/73  Pulse: 83 88  Resp: 17 (!) 28  Temp:  98.7 F (37.1 C)  SpO2: 100% 100%   Gen: In bed, sitter at bedside. Mittens in place bilaterally with seizure pads on bed.  Resp: non-labored breathing, no grossly audible wheezing Cardiac: Perfusing extremities well  Abd: soft, nt  Neuro: MS: lethargic, eyes remain closed throughout assessment. Was able to answer simple questions and follow simple commands (move arms, stick out tongue) knew name and that he was in hospital. Forceful eye opening revealed pinpoint pupils.   13/06/21 to assess given patient status at this time, but pupils are sluggishly reactive, facial movement is symmetric, hearing appears intact to voice, shoulder shrug was symmetric, and tongue was midline without atrophy.    Motor: tone normal. Moves all extremities without difficulty to command.  Sensory:unable to assess at this time.    Pertinent Labs:  Results for Brian Stafford, Brian Stafford (MRN [id number]) as of 12/07/2019 09:45  Ref. Range 12/07/2019 05:05 12/07/2019 05:06  CK Total Latest Ref Range: 49.0 - 397.0 U/L 848 (H)   Troponin I (High Sensitivity) Latest Ref Range: <18 ng/L 5   Lactic Acid, Venous Latest Ref Range: 0.5 - 1.9 mmol/L  0.8   Results for Brian Stafford, Brian Stafford (MRN 13/07/2019) as of 12/07/2019 09:45  Ref. Range 12/07/2019 05:05  WBC Latest Ref Range: 4.0 - 10.5 K/uL 4.3  RBC Latest Ref Range: 4.22 - 5.81 MIL/uL 3.85 (L)  Hemoglobin Latest Ref Range: 13.0 - 17.0 g/dL 13/07/2019 (L)  HCT Latest Ref Range: 39 - 52 % 33.8 (L)  MCV Latest Ref Range: 80.0 - 100.0 fL 87.8  MCH Latest Ref Range: 26.0 - 34.0 pg 27.3  MCHC Latest Ref Range: 30.0 - 36.0 g/dL 13/07/2019  RDW Latest Ref Range: 11.5 - 15.5 % 12.9  Platelets Latest Ref Range: 150 - 400 K/uL 244   Impression: 44 year old black male with history of bipolar, schizophrenia, admitted with new onset seizure disorder and choreiform movements (tongue thrushing).   Recommendations: - 1. Repeat brain CT if patient has worsening neurologic decline 2. Ativan decreased to 1mg  q6h PRN seizure activity. 3. Trend CK / troponin levels  4. Decreased depakote to 500mg  in am and 750mg  in pm given somnolence.  5. Consider LP if patient neurologic status doesn't improve or patient develops fever; start meningitic coverage with vancomycin, ceftriaxone and acyclovir if fever develops.  6. We will continue to follow along with you.   30.0 MD-PhD [facility] [phone]

## 2019-12-08 LAB — COMPREHENSIVE METABOLIC PANEL
ALT: 21 U/L (ref 0–44)
AST: 34 U/L (ref 15–41)
Albumin: 3 g/dL — ABNORMAL LOW (ref 3.5–5.0)
Alkaline Phosphatase: 72 U/L (ref 38–126)
Anion gap: 12 (ref 5–15)
BUN: 12 mg/dL (ref 6–20)
CO2: 20 mmol/L — ABNORMAL LOW (ref 22–32)
Calcium: 8.8 mg/dL — ABNORMAL LOW (ref 8.9–10.3)
Chloride: 110 mmol/L (ref 98–111)
Creatinine, Ser: 1.15 mg/dL (ref 0.61–1.24)
GFR, Estimated: >60 mL/min (ref >60)
Glucose, Bld: 78 mg/dL (ref 70–99)
Potassium: 3.4 mmol/L — ABNORMAL LOW (ref 3.5–5.1)
Sodium: 142 mmol/L (ref 135–145)
Total Bilirubin: 0.9 mg/dL (ref 0.3–1.2)
Total Protein: 6.3 g/dL — ABNORMAL LOW (ref 6.5–8.1)

## 2019-12-08 LAB — MAGNESIUM: Magnesium: 2.2 mg/dL (ref 1.7–2.4)

## 2019-12-08 LAB — CBC
HCT: 36.1 % — ABNORMAL LOW (ref 39.0–52.0)
Hemoglobin: 11.5 g/dL — ABNORMAL LOW (ref 13.0–17.0)
MCH: 27.4 pg (ref 26.0–34.0)
MCHC: 31.9 g/dL (ref 30.0–36.0)
MCV: 86 fL (ref 80.0–100.0)
Platelets: 251 10*3/uL (ref 150–400)
RBC: 4.2 MIL/uL — ABNORMAL LOW (ref 4.22–5.81)
RDW: 12.7 % (ref 11.5–15.5)
WBC: 7.7 10*3/uL (ref 4.0–10.5)
nRBC: 0 % (ref 0.0–0.2)

## 2019-12-08 LAB — PHOSPHORUS: Phosphorus: 2.1 mg/dL — ABNORMAL LOW (ref 2.5–4.6)

## 2019-12-08 LAB — PROCALCITONIN: Procalcitonin: <0.1 ng/mL

## 2019-12-08 MED ORDER — CLOZAPINE 100 MG PO TABS: 200.0000 mg | ORAL_TABLET | Freq: Every day | ORAL | Status: DC

## 2019-12-08 MED ORDER — VALPROATE SODIUM 500 MG/5ML IV SOLN
750.0000 mg | Freq: Two times a day (BID) | INTRAVENOUS | Status: DC
  Filled 2019-12-08: qty 7.5

## 2019-12-08 MED ORDER — OLANZAPINE 5 MG PO TBDP
10.0000 mg | ORAL_TABLET | ORAL | Status: DC | PRN
  Administered 2019-12-10: 10 mg via ORAL
  Filled 2019-12-08 (×2): qty 2

## 2019-12-08 MED ORDER — DIVALPROEX SODIUM 500 MG PO DR TAB
750.0000 mg | DELAYED_RELEASE_TABLET | Freq: Two times a day (BID) | ORAL | Status: DC
  Administered 2019-12-08 – 2019-12-14 (×12): 750 mg via ORAL
  Filled 2019-12-08 (×14): qty 1

## 2019-12-08 MED ORDER — CLOZAPINE 100 MG PO TABS: 400.0000 mg | ORAL_TABLET | Freq: Every day | ORAL | Status: DC

## 2019-12-08 MED ORDER — CLOZAPINE 100 MG PO TABS
400.0000 mg | ORAL_TABLET | Freq: Every day | ORAL | Status: DC
  Filled 2019-12-08: qty 4

## 2019-12-08 NOTE — Progress Notes (Addendum)
Neurology Progress Note  Patient ID: Brian Stafford is a 44 y.o. with PMHx of  has a past medical history of Anxiety, Bipolar 1 disorder (HCC), Mental retardation, and Schizophrenia (HCC). he was brought to the ED by ambulance on 12/06/2019 for multiple witnessed tonic clonic seizure activity. At time of admission he was on cogentin, depakote 500mg  bid, prozac, and clozaril.     Initially consulted for: seizure  Major interval events:  12/06/2019 received 2mg  ativan IV prior to MRI. Post MRI became highly sedated, unresponsive to stimuli, not fully responsive to romazicon. Not needing intubation. MRI unremarkable.   Subjective: lethargic  Exam: Vitals:   12/08/19 0500 12/08/19 0730  BP: 115/62 (!) 131/57  Pulse:    Resp:  20  Temp:  97.6 F (36.4 C)  SpO2: 100%    Gen: In bed, sitter at bedside. Mittens in place bilaterally with seizure pads on bed.  Resp: non-labored breathing, no grossly audible wheezing Cardiac: Perfusing extremities well  Abd: soft, nt  Neuro: MS: lethargic, eyes remain closed throughout assessment. Was able to answer simple questions and follow simple commands (move arms, stick out tongue) knew name and that he was in hospital. Forceful eye opening revealed pinpoint pupils.   13/07/21 to assess given patient status at this time, but pupils are sluggishly reactive, facial movement is symmetric, hearing appears intact to voice, shoulder shrug was symmetric, and tongue was midline without atrophy.    Motor: tone normal. Moves all extremities without difficulty to command.  Sensory:unable to assess at this time.    Pertinent Labs:  Results for TALON, REGALA (MRN [id number]) as of 12/07/2019 09:45  Ref. Range 12/07/2019 05:05 12/07/2019 05:06  CK Total Latest Ref Range: 49.0 - 397.0 U/L 848 (H)   Troponin I (High Sensitivity) Latest Ref Range: <18 ng/L 5   Lactic Acid, Venous Latest Ref Range: 0.5 - 1.9 mmol/L  0.8   Results for TORRION, WITTER  (MRN 13/07/2019) as of 12/07/2019 09:45  Ref. Range 12/07/2019 05:05  WBC Latest Ref Range: 4.0 - 10.5 K/uL 4.3  RBC Latest Ref Range: 4.22 - 5.81 MIL/uL 3.85 (L)  Hemoglobin Latest Ref Range: 13.0 - 17.0 g/dL 13/07/2019 (L)  HCT Latest Ref Range: 39 - 52 % 33.8 (L)  MCV Latest Ref Range: 80.0 - 100.0 fL 87.8  MCH Latest Ref Range: 26.0 - 34.0 pg 27.3  MCHC Latest Ref Range: 30.0 - 36.0 g/dL 13/07/2019  RDW Latest Ref Range: 11.5 - 15.5 % 12.9  Platelets Latest Ref Range: 150 - 400 K/uL 244   Impression: 44 year old black male with history of bipolar, schizophrenia, admitted with new onset seizure disorder and choreiform movements (tongue thrushing).  Potential etiology of Clozapine withdrawal was postulated by pharmacy and is quite possible given his presentation with diffuse tremor initially as well as seizure  Recommendations: 1. Repeat brain CT if patient has worsening neurologic decline 2. No need to trend CK / troponin levels at this point given downtreading 3  Depakote: maintain dosage at 750mg  twice daily for total dose of 1500mg  daily (concerned about compliance with three times daily dosing, and patient much more bright this am), please transition to PO 4  We will sign off at this time but remain available to assist. Pt should follow up with outpatient neurology after discharge.   33.0 MD-PhD [facility] [phone]   MD addendum Agree with history, physical, assessment and plan as documented above.  I evaluated the patient in  conjunction with the PA and made clarifications and additions to the note as necessary  Addended for charge capture

## 2019-12-08 NOTE — Plan of Care (Signed)
  Problem: Clinical Measurements: Goal: Ability to maintain clinical measurements within normal limits will improve Outcome: Progressing Goal: Will remain free from infection Outcome: Progressing Goal: Diagnostic test results will improve Outcome: Progressing Goal: Respiratory complications will improve Outcome: Progressing Goal: Cardiovascular complication will be avoided Outcome: Progressing   Problem: Activity: Goal: Risk for activity intolerance will decrease Outcome: Progressing   Problem: Nutrition: Goal: Adequate nutrition will be maintained Outcome: Progressing   Problem: Coping: Goal: Level of anxiety will decrease Outcome: Progressing   Problem: Elimination: Goal: Will not experience complications related to bowel motility Outcome: Progressing Goal: Will not experience complications related to urinary retention Outcome: Progressing   Problem: Pain Managment: Goal: General experience of comfort will improve Outcome: Progressing   Problem: Safety: Goal: Ability to remain free from injury will improve Outcome: Progressing   Problem: Skin Integrity: Goal: Risk for impaired skin integrity will decrease Outcome: Progressing   Problem: Health Behavior/Discharge Planning: Goal: Compliance with prescribed medication regimen will improve Outcome: Progressing   Problem: Medication: Goal: Risk for medication side effects will decrease Outcome: Progressing   Problem: Clinical Measurements: Goal: Complications related to the disease process, condition or treatment will be avoided or minimized Outcome: Progressing Goal: Diagnostic test results will improve Outcome: Progressing   Problem: Self-Concept: Goal: Level of anxiety will decrease Outcome: Progressing   Problem: Education: Goal: Expressions of having a comfortable level of knowledge regarding the disease process will increase Outcome: Not Progressing Note: Patient has trouble expressing answers to some  questions and will answer with something that is not ever relevant to question asked.   Problem: Coping: Goal: Ability to adjust to condition or change in health will improve Outcome: Not Progressing Goal: Ability to identify appropriate support needs will improve Outcome: Not Progressing   Problem: Education: Goal: Knowledge of General Education information will improve Description: Including pain rating scale, medication(s)/side effects and non-pharmacologic comfort measures Outcome: Not Progressing   Problem: Health Behavior/Discharge Planning: Goal: Ability to manage health-related needs will improve Outcome: Not Progressing   Problem: Education: Goal: Expressions of having a comfortable level of knowledge regarding the disease process will increase Outcome: Not Progressing   Problem: Coping: Goal: Ability to adjust to condition or change in health will improve Outcome: Not Progressing Goal: Ability to identify appropriate support needs will improve Outcome: Not Progressing   Problem: Safety: Goal: Verbalization of understanding the information provided will improve Outcome: Not Progressing   Problem: Self-Concept: Goal: Ability to verbalize feelings about condition will improve Outcome: Not Progressing

## 2019-12-08 NOTE — Progress Notes (Signed)
  Speech Language Pathology Treatment: Dysphagia  Patient Details Name: Brian Stafford MRN: [id number] DOB: [birth year] Today's Date: 12/08/2019 Time: 1050-1100 SLP Time Calculation (min) (ACUTE ONLY): 10 min  Assessment / Plan / Recommendation Clinical Impression  Patient seen for f/u po readiness. Per RN, patient alert and has been started on a po diet, doing well. Patient alert and cooperative, upright in bed self feeding am meal. Patient with a functional oropharyngeal swallow with efficient mastication of bolus and no overt indication of aspiration. No SLP f/u indicated at this time.    HPI HPI: Patient is a 44 y.o. male with PMH: schizophrenia, bipolar disorder, anxiety, intellectual disability, who presented to hospital from group home where he lives with new onset seizures which were witnessed by group home staff. UA negative for UTI, CT head negative for intracranial acute etiology. In hospital, he had decreased responsiveness and respirations after receiving Ativan but did not require intubation.      SLP Plan  All goals met;Discharge SLP treatment due to (comment)       Recommendations  Diet recommendations: Regular;Thin liquid Liquids provided via: Cup;Straw Medication Administration: Whole meds with liquid Supervision: Patient able to self feed Compensations: Slow rate;Small sips/bites Postural Changes and/or Swallow Maneuvers: Seated upright 90 degrees                Oral Care Recommendations: Oral care BID Follow up Recommendations: None SLP Visit Diagnosis: Dysphagia, unspecified (R13.10) Plan: All goals met;Discharge SLP treatment due to (comment)       GO             Brian Rainwater MA, CCC-SLP     Brian Stafford 12/08/2019, 11:19 AM

## 2019-12-08 NOTE — Progress Notes (Signed)
The patient is wide awake this morning. A & O x 4 and able to make his needs known. Denied any acute pain. Will continue to monitor.

## 2019-12-08 NOTE — Consult Note (Signed)
Reason for Consult: ''Polypharmacy Presented with AMS and new seizure on unspecified doses of multiple meds including clozapine - concern for abrupt cessation/noncompliance - can we taper this down or just stop it?''  Recommendation Regarding psychiatric medications discussed with Dr. Natale Milch as follows: -Change Olanzapine 10 mg at bedtime to as needed for agitation -Do not stop Clozaril abruptly, rather decreased to 400 mg at bedtime today for Schizoaffective Bipolar. -Re-consult psychiatric service as needed.  Thedore Mins, MD Attending psychiatrist.

## 2019-12-08 NOTE — Progress Notes (Signed)
PROGRESS NOTE    ABDULRAHEEM Stafford  Brian Stafford DOB: 04-19-1975 DOA: 12/06/2019 PCP: Fleet Contras, MD   Brief Narrative:  Brian Stafford is a 44 y.o. male with medical history significant of schizophrenia, bipolar disorder, mild electro disability disorder, anxiety, presented with new onset of seizures. Patient remained confused probably from seizure and postictal state, most history obtained from ED staff and review of patient's chart. Patient started to have visual and auditory hallucination "seeing and hearing from demons" 3 days ago and came to ED for evaluation.  Patient moved to live in a couple 3 to 4 months ago, and has had since experiencing twitching like movement of lips and tongue as well as uncontrolled movements of his legs.  Patient was evaluated by psychiatrist 2 days ago in the ED, who found out that patient has been of psychiatry medication for last 3 months, at the same time, it was found the patient has involuntary tongue movement, which was improved after given 1 dose of Cogentin 1 mg IM. Patient was sent back to group home and starting psychiatric medications. However, it appears that the patient continues to have bizarre movement and hallucinations, group home called EMS this morning. EMS arrived and found the patient had seizure with whole body shaking for about 2 minutes. Patient had another witnessed seizure in the ED witnessed by the nurse, with brief episode of hypoxia oxygenation dropped to 70s. Whole episode lasted about a minute. CT head negative for intracranial acute etiology. Blood work, WBC 10, UDS negative, UA negative for UTI.  12/07/2019 rapid response overnight soon after admission - obtunded after ativan - no improvement with narcan or flumazanil but he is protecting airway - no intubation indicated. 12/08/2019 -much more awake alert oriented this morning appears to be back to baseline, concern for polypharmacy as such psychiatry was requested to follow along  for medication management recommending decrease dose of clozapine  Assessment & Plan:   Active Problems:   Seizure (HCC)   New onset seizure, unclear etiology, POA Keppra loading completed in the ED Questionably in the setting of high-dose clozapine per discussion with psych Neurology recommend continue Depakote 750 twice daily CT head negative  Acute encephalopathy, rule out metabolic versus toxic etiology, resolving Likely postictal given above, concern for polypharmacy however patient did not respond to flumazenil or Narcan Continue supportive care, imaging negative thus far Awake alert oriented this morning back to baseline  Involuntary body movement -Chorea vs extrapyramidal syndrome? Psychiatrist suspect extrapyramidal syndrome 3 days ago, appeared to respond to Cogentin -Unclear if patient was taking his medications at previous facility, psychiatry involved, recommending decrease dose of clozapine given its known provocation of seizures -Olanzapine changed to as needed, clozapine decreased to 400 mg nightly  Dehydration, poor p.o. intake with lactic acidosis -Continue IV fluids, supportive care  Schizophrenia -Psychiatry on board, on antipsychiatric medications -clozapine and Geodon at intake -Concern for polypharmacy or even toxic ingestion given symptoms as above, defer to psychiatry for weaning or increasing patient's doses per their expertise -Transition olanzapine to as needed, clozapine decreased to 400 mg nightly  DVT prophylaxis: Lovenox Code Status: Full code Family Communication: None available  Status is: Inpatient  Dispo: The patient is from: Group home              Anticipated d/c is to: Same              Anticipated d/c date is: 3 to 5 days pending clinical course  Patient currently not medically stable for discharge  Consultants:   Psychiatry, neurology  Procedures:   None indicated  Antimicrobials:  None  indicated  Subjective: No acute issues or events overnight, patient much more awake alert oriented this morning denies nausea, vomiting, diarrhea, constipation, headache, fevers, chills.  He does have flight of ideas and is easily distracted; but otherwise ANO x4 and able to recall his medications denies any recent medication changes accidental doses or issues with medications in recent past.  Objective: Vitals:   12/07/19 2200 12/08/19 0000 12/08/19 0439 12/08/19 0500  BP: (!) 94/53 (!) 103/52 124/65 115/62  Pulse:  92 (!) 102   Resp: 19 17 (!) 24   Temp:  97.9 F (36.6 C) (!) 97.4 F (36.3 C)   TempSrc:  Oral Oral   SpO2:  100% 100% 100%  Weight:      Height:        Intake/Output Summary (Last 24 hours) at 12/08/2019 0801 Last data filed at 12/08/2019 0700 Gross per 24 hour  Intake 1464.89 ml  Output 1700 ml  Net -235.11 ml   Filed Weights   12/06/19 2052  Weight: 74.5 kg    Examination:  General:  Pleasantly resting in bed, No acute distress. HEENT:  Normocephalic atraumatic.  Sclerae nonicteric, noninjected.  Extraocular movements intact bilaterally. Neck:  Without mass or deformity.  Trachea is midline. Lungs:  Clear to auscultate bilaterally without rhonchi, wheeze, or rales. Heart:  Regular rate and rhythm.  Without murmurs, rubs, or gallops. Abdomen:  Soft, nontender, nondistended.  Without guarding or rebound. Extremities: Without cyanosis, clubbing, edema, or obvious deformity. Vascular:  Dorsalis pedis and posterior tibial pulses palpable bilaterally. Skin:  Warm and dry, no erythema, no ulcerations.   Data Reviewed: I have personally reviewed following labs and imaging studies  CBC: Recent Labs  Lab 12/03/19 1423 12/06/19 0910 12/07/19 0255 12/07/19 0505 12/08/19 0349  WBC 7.3 10.0  --  4.3 7.7  NEUTROABS 5.6 8.5*  --  3.1  --   HGB 11.6* 11.8* 10.9* 10.5* 11.5*  HCT 35.9* 36.9* 32.0* 33.8* 36.1*  MCV 84.9 84.6  --  87.8 86.0  PLT 304 322  --  244  251   Basic Metabolic Panel: Recent Labs  Lab 12/03/19 1423 12/06/19 0910 12/07/19 0255 12/07/19 0505 12/08/19 0349  NA 141 138 142 142 142  K 4.0 3.5 3.1* 3.4* 3.4*  CL 109 103  --  107 110  CO2 18* 21*  --  22 20*  GLUCOSE 83 87  --  84 78  BUN 24* 25*  --  20 12  CREATININE 1.20 1.33*  --  1.16 1.15  CALCIUM 9.3 9.0  --  8.2* 8.8*  MG  --   --   --  2.6* 2.2  PHOS  --   --   --   --  2.1*   GFR: Estimated Creatinine Clearance: 88.2 mL/min (by C-G formula based on SCr of 1.15 mg/dL). Liver Function Tests: Recent Labs  Lab 12/03/19 1423 12/06/19 0910 12/07/19 0505 12/08/19 0349  AST 24 43* 33 34  ALT 15 25 21 21   ALKPHOS 76 74 63 72  BILITOT 0.8 1.1 0.8 0.9  PROT 7.8 7.1 6.1* 6.3*  ALBUMIN 4.2 3.7 3.0* 3.0*   No results for input(s): LIPASE, AMYLASE in the last 168 hours. No results for input(s): AMMONIA in the last 168 hours. Coagulation Profile: No results for input(s): INR, PROTIME in the last 168 hours. Cardiac  Enzymes: Recent Labs  Lab 12/06/19 2216 12/07/19 0505  CKTOTAL 1,084* 848*   BNP (last 3 results) No results for input(s): PROBNP in the last 8760 hours. HbA1C: No results for input(s): HGBA1C in the last 72 hours. CBG: No results for input(s): GLUCAP in the last 168 hours. Lipid Profile: No results for input(s): CHOL, HDL, LDLCALC, TRIG, CHOLHDL, LDLDIRECT in the last 72 hours. Thyroid Function Tests: No results for input(s): TSH, T4TOTAL, FREET4, T3FREE, THYROIDAB in the last 72 hours. Anemia Panel: No results for input(s): VITAMINB12, FOLATE, FERRITIN, TIBC, IRON, RETICCTPCT in the last 72 hours. Sepsis Labs: Recent Labs  Lab 12/06/19 0910 12/06/19 1150 12/06/19 2218 12/07/19 0506 12/08/19 0349  PROCALCITON  --   --   --   --  <0.10  LATICACIDVEN 2.0* 1.4 1.3 0.8  --     Recent Results (from the past 240 hour(s))  Respiratory Panel by RT PCR (Flu A&B, Covid) - Nasopharyngeal Swab     Status: None   Collection Time: 12/03/19   2:30 PM   Specimen: Nasopharyngeal Swab  Result Value Ref Range Status   SARS Coronavirus 2 by RT PCR NEGATIVE NEGATIVE Final    Comment: (NOTE) SARS-CoV-2 target nucleic acids are NOT DETECTED.  The SARS-CoV-2 RNA is generally detectable in upper respiratoy specimens during the acute phase of infection. The lowest concentration of SARS-CoV-2 viral copies this assay can detect is 131 copies/mL. A negative result does not preclude SARS-Cov-2 infection and should not be used as the sole basis for treatment or other patient management decisions. A negative result may occur with  improper specimen collection/handling, submission of specimen other than nasopharyngeal swab, presence of viral mutation(s) within the areas targeted by this assay, and inadequate number of viral copies (<131 copies/mL). A negative result must be combined with clinical observations, patient history, and epidemiological information. The expected result is Negative.  Fact Sheet for Patients:  https://www.moore.com/  Fact Sheet for Healthcare Providers:  https://www.young.biz/  This test is no t yet approved or cleared by the [place] FDA and  has been authorized for detection and/or diagnosis of SARS-CoV-2 by FDA under an Emergency Use Authorization (EUA). This EUA will remain  in effect (meaning this test can be used) for the duration of the COVID-19 declaration under Section 564(b)(1) of the Act, 21 U.S.C. section 360bbb-3(b)(1), unless the authorization is terminated or revoked sooner.     Influenza A by PCR NEGATIVE NEGATIVE Final   Influenza B by PCR NEGATIVE NEGATIVE Final    Comment: (NOTE) The Xpert Xpress SARS-CoV-2/FLU/RSV assay is intended as an aid in  the diagnosis of influenza from Nasopharyngeal swab specimens and  should not be used as a sole basis for treatment. Nasal washings and  aspirates are unacceptable for Xpert Xpress SARS-CoV-2/FLU/RSV   testing.  Fact Sheet for Patients: https://www.moore.com/  Fact Sheet for Healthcare Providers: https://www.young.biz/  This test is not yet approved or cleared by the [place] FDA and  has been authorized for detection and/or diagnosis of SARS-CoV-2 by  FDA under an Emergency Use Authorization (EUA). This EUA will remain  in effect (meaning this test can be used) for the duration of the  Covid-19 declaration under Section 564(b)(1) of the Act, 21  U.S.C. section 360bbb-3(b)(1), unless the authorization is  terminated or revoked. Performed at [facility], [address], [place], [place] [address]          Radiology Studies: DG Chest 1 View  Result Date: 12/06/2019  CLINICAL DATA:  Seizure EXAM: CHEST  1 VIEW COMPARISON:  04/04/2017 FINDINGS: The heart size and mediastinal contours are within normal limits. Both lungs are clear. The visualized skeletal structures are unremarkable. IMPRESSION: No active disease. Electronically Signed   By: Marlan Palau M.D.   On: 12/06/2019 19:04   DG Abd 1 View  Result Date: 12/06/2019 CLINICAL DATA:  Seizure EXAM: ABDOMEN - 1 VIEW COMPARISON:  None. FINDINGS: Nonobstructive bowel gas pattern. No organomegaly or free air visualized. Study limited by patient motion and rotation. IMPRESSION: No acute findings. Electronically Signed   By: Charlett Nose M.D.   On: 12/06/2019 20:55   CT Head Wo Contrast  Result Date: 12/06/2019 CLINICAL DATA:  Seizure. EXAM: CT HEAD WITHOUT CONTRAST TECHNIQUE: Contiguous axial images were obtained from the base of the skull through the vertex without intravenous contrast. COMPARISON:  Brain MRI 12/10/2018 FINDINGS: Brain: Brain volume is normal for age. No intracranial hemorrhage, mass effect, or midline shift. No hydrocephalus. The basilar cisterns are patent. No evidence of territorial infarct or acute ischemia. No extra-axial or intracranial fluid  collection. Vascular: No hyperdense vessel or unexpected calcification. Skull: No fracture or focal lesion. Sinuses/Orbits: Complete opacification of left maxillary sinus with heterogeneous material and cortical thickening. Material extends through the ostiomeatal complex which appears widened. There is mucosal thickening and opacification of the left ethmoid air cells and left frontal sinus. Mastoid air cells are clear. Included orbits are unremarkable. Other: None. IMPRESSION: 1. No acute intracranial abnormality. 2. Chronic left-sided paranasal sinus disease. Consider elective ENT evaluation to exclude the possibility of underlying polyp or obstructing lesion. Electronically Signed   By: Narda Rutherford M.D.   On: 12/06/2019 15:31   MR BRAIN WO CONTRAST  Result Date: 12/07/2019 CLINICAL DATA:  Initial evaluation for acute seizure. EXAM: MRI HEAD WITHOUT CONTRAST TECHNIQUE: Multiplanar, multiecho pulse sequences of the brain and surrounding structures were obtained without intravenous contrast. COMPARISON:  Prior CT from 12/06/2019. FINDINGS: Brain: Cerebral volume within normal limits for age. No focal parenchymal signal abnormality. No abnormal foci of restricted to suggest acute or subacute ischemia or changes related to seizure. Gray-white matter differentiation maintained. No encephalomalacia gliosis to suggest chronic cortical infarction or other insult. No foci of susceptibility artifact to suggest acute or chronic intracranial hemorrhage. No mass lesion, midline shift or mass effect. No hydrocephalus or extra-axial fluid collection. Pituitary gland suprasellar region normal. Midline structures intact. No intrinsic temporal lobe abnormality. Vascular: Major intracranial vascular flow voids are well maintained. Skull and upper cervical spine: Craniocervical junction within normal limits. Bone marrow signal intensity normal. No scalp soft tissue abnormality. Sinuses/Orbits: Globes and orbital soft  tissues within normal limits. Extensive left frontal, ethmoidal, and maxillary sinusitis, chronic in appearance. No mastoid effusion. Inner ear structures within normal limits. Other: None. IMPRESSION: 1. Normal brain MRI. No acute intracranial abnormality identified. 2. Extensive left-sided paranasal sinusitis, chronic in appearance. Electronically Signed   By: Rise Mu M.D.   On: 12/07/2019 02:15   DG Pelvis Portable  Result Date: 12/06/2019 CLINICAL DATA:  Seizure EXAM: PORTABLE PELVIS 1-2 VIEWS COMPARISON:  None. FINDINGS: Hip joints and SI joints symmetric and unremarkable. No acute bony abnormality. Specifically, no fracture, subluxation, or dislocation. IMPRESSION: No acute bony abnormality. Electronically Signed   By: Charlett Nose M.D.   On: 12/06/2019 20:55   EEG adult  Result Date: 12/06/2019 Charlsie Quest, MD     12/06/2019  6:10 PM Patient Name: Brian Stafford MRN: [id number] Epilepsy Attending:  Charlsie Quest Referring Physician/Provider: Dr Clemon Chambers Date: 12/06/2019 Duration: 30.07 mins Patient history: 44 year old male with new seizure activity. EEG to evaluate for seizure. Level of alertness: Awake, asleep AEDs during EEG study: VPA Technical aspects: This EEG study was done with scalp electrodes positioned according to the 10-20 International system of electrode placement. Electrical activity was acquired at a sampling rate of 500Hz  and reviewed with a high frequency filter of 70Hz  and a low frequency filter of 1Hz . EEG data were recorded continuously and digitally stored. Description: The posterior dominant rhythm consists of 9 Hz activity of moderate voltage (25-35 uV) seen predominantly in posterior head regions, symmetric and reactive to eye opening and eye closing. Sleep was characterized by vertex waves, sleep spindles (12 to 14 Hz), maximal frontocentral region.   Hyperventilation and photic stimulation were not performed.   Of note, parts of eeg were difficult  to interpret due to significant movement artifact. IMPRESSION: This technically difficult study study is within normal limits. No seizures or epileptiform discharges were seen throughout the recording. Priyanka '  Scheduled Meds: . benztropine mesylate  2 mg Intravenous Daily  . enoxaparin (LOVENOX) injection  40 mg Subcutaneous Q24H  . FLUoxetine  40 mg Oral Daily  . multivitamin with minerals  1 tablet Oral Daily  . OLANZapine zydis  10 mg Oral QHS  . propranolol ER  60 mg Oral Daily   Continuous Infusions: . valproate sodium 375 mg (12/08/19 0541)   And  . valproate sodium 500 mg (12/07/19 2209)     LOS: 2 days   Time spent: 13/07/21  13/06/21, DO [facility]  If 7PM-7AM, please contact night-coverage [email or web address]  12/08/2019, 8:01 AM

## 2019-12-09 LAB — CBC
HCT: 36.3 % — ABNORMAL LOW (ref 39.0–52.0)
Hemoglobin: 11.5 g/dL — ABNORMAL LOW (ref 13.0–17.0)
MCH: 27.2 pg (ref 26.0–34.0)
MCHC: 31.7 g/dL (ref 30.0–36.0)
MCV: 85.8 fL (ref 80.0–100.0)
Platelets: 273 10*3/uL (ref 150–400)
RBC: 4.23 MIL/uL (ref 4.22–5.81)
RDW: 12.7 % (ref 11.5–15.5)
WBC: 6.1 10*3/uL (ref 4.0–10.5)
nRBC: 0 % (ref 0.0–0.2)

## 2019-12-09 LAB — COMPREHENSIVE METABOLIC PANEL
ALT: 21 U/L (ref 0–44)
AST: 34 U/L (ref 15–41)
Albumin: 3.1 g/dL — ABNORMAL LOW (ref 3.5–5.0)
Alkaline Phosphatase: 60 U/L (ref 38–126)
Anion gap: 8 (ref 5–15)
BUN: 7 mg/dL (ref 6–20)
CO2: 30 mmol/L (ref 22–32)
Calcium: 9 mg/dL (ref 8.9–10.3)
Chloride: 104 mmol/L (ref 98–111)
Creatinine, Ser: 0.94 mg/dL (ref 0.61–1.24)
GFR, Estimated: >60 mL/min (ref >60)
Glucose, Bld: 104 mg/dL — ABNORMAL HIGH (ref 70–99)
Potassium: 3.4 mmol/L — ABNORMAL LOW (ref 3.5–5.1)
Sodium: 142 mmol/L (ref 135–145)
Total Bilirubin: 0.5 mg/dL (ref 0.3–1.2)
Total Protein: 6.6 g/dL (ref 6.5–8.1)

## 2019-12-09 LAB — CLOZAPINE (CLOZARIL)
Clozapine Lvl: <20 ng/mL — ABNORMAL LOW (ref 350–650)
NorClozapine: <20 ng/mL
Total(Cloz+Norcloz): <40 ng/mL

## 2019-12-09 NOTE — Progress Notes (Signed)
PROGRESS NOTE    Brian Stafford  ZOX:[id number] DOB: 1975/06/04 DOA: 12/06/2019 PCP: Fleet ContrasAvbuere, Edwin, MD   Brief Narrative:  Brian Stafford is a 44 y.o. male with medical history significant of schizophrenia, bipolar disorder, mild electro disability disorder, anxiety, presented with new onset of seizures. Patient remained confused probably from seizure and postictal state, most history obtained from ED staff and review of patient's chart. Patient started to have visual and auditory hallucination "seeing and hearing from demons" 3 days ago and came to ED for evaluation.  Patient moved to live in a couple 3 to 4 months ago, and has had since experiencing twitching like movement of lips and tongue as well as uncontrolled movements of his legs.  Patient was evaluated by psychiatrist 2 days ago in the ED, who found out that patient has been of psychiatry medication for last 3 months, at the same time, it was found the patient has involuntary tongue movement, which was improved after given 1 dose of Cogentin 1 mg IM. Patient was sent back to group home and starting psychiatric medications. However, it appears that the patient continues to have bizarre movement and hallucinations, group home called EMS this morning. EMS arrived and found the patient had seizure with whole body shaking for about 2 minutes. Patient had another witnessed seizure in the ED witnessed by the nurse, with brief episode of hypoxia oxygenation dropped to 70s. Whole episode lasted about a minute. CT head negative for intracranial acute etiology. Blood work, WBC 10, UDS negative, UA negative for UTI.  12/07/2019 rapid response overnight soon after admission - obtunded after ativan - no improvement with narcan or flumazanil but he is protecting airway - no intubation indicated. 12/08/2019 -much more awake alert oriented this morning appears to be back to baseline, concern for polypharmacy as such psychiatry was requested to follow along  for medication management recommending decrease dose of clozapine 12/09/2019 -continues to tolerate new medication doses and therapy, likely disposition the next 24 to 48 hours pending clinical course, tolerance of new medications, lack of new seizure activity and safe disposition location, likely back to group home.  Assessment & Plan:   Active Problems:   Seizure (HCC)   New onset seizure, unclear etiology, POA Keppra loading completed in the ED Questionably in the setting of high-dose clozapine per discussion with psych/pharmacy given suspected noncompliance with clozapine (off for 3 months then resumed high dose) -clozapine has been discontinued at this time with appropriate improvement in symptoms Neurology recommend continue Depakote 750 twice daily CT head negative  Acute encephalopathy, rule out metabolic versus toxic etiology, resolved Likely postictal given above, concern for polypharmacy however patient did not respond to flumazenil or Narcan Continue supportive care, imaging negative thus far Awake alert oriented this morning back to baseline  Involuntary body movement, improving -Chorea vs extrapyramidal syndrome?   Improving with reduction of medications as outlined above -Unclear if patient was taking his medications at previous facility, psychiatry involved, recommending decrease dose of clozapine given its known provocation of seizures -Olanzapine changed to as needed, clozapine discontinued  Dehydration, poor p.o. intake with lactic acidosis -Continue IV fluids, supportive care  Schizophrenia -Psychiatry on board, on antipsychiatric medications -clozapine and Geodon at intake -Concern for polypharmacy or even toxic ingestion given symptoms as above, defer to psychiatry for weaning or increasing patient's doses per their expertise -Transition olanzapine to as needed, clozapine discontinued  DVT prophylaxis: Lovenox Code Status: Full code Family Communication:  None available  Status is:  Inpatient  Dispo: The patient is from: Group home              Anticipated d/c is to: Same              Anticipated d/c date is: 24h pending clinical course              Patient currently not medically stable for discharge  Consultants:   Psychiatry, neurology  Procedures:   None indicated  Antimicrobials:  None indicated  Subjective: No acute issues or events overnight, ANO x3 denies headache, fevers, chills, nausea, vomiting, diarrhea, constipation  Objective: Vitals:   12/09/19 0100 12/09/19 0200 12/09/19 0300 12/09/19 0330  BP: (!) 101/57 (!) 101/59 100/71 104/62  Pulse: 77 77 77 76  Resp: 19 (!) 24 (!) 23 (!) 24  Temp:    98.2 F (36.8 C)  TempSrc:    Oral  SpO2: 98% 98% 96% 96%  Weight:      Height:        Intake/Output Summary (Last 24 hours) at 12/09/2019 0734 Last data filed at 12/08/2019 2212 Gross per 24 hour  Intake 1615 ml  Output 800 ml  Net 815 ml   Filed Weights   12/06/19 2052  Weight: 74.5 kg    Examination:  General:  Pleasantly resting in bed, No acute distress. HEENT:  Normocephalic atraumatic.  Sclerae nonicteric, noninjected.  Extraocular movements intact bilaterally. Neck:  Without mass or deformity.  Trachea is midline. Lungs:  Clear to auscultate bilaterally without rhonchi, wheeze, or rales. Heart:  Regular rate and rhythm.  Without murmurs, rubs, or gallops. Abdomen:  Soft, nontender, nondistended.  Without guarding or rebound. Extremities: Without cyanosis, clubbing, edema, or obvious deformity. Vascular:  Dorsalis pedis and posterior tibial pulses palpable bilaterally. Skin:  Warm and dry, no erythema, no ulcerations.   Data Reviewed: I have personally reviewed following labs and imaging studies  CBC: Recent Labs  Lab 12/03/19 1423 12/06/19 0910 12/07/19 0255 12/07/19 0505 12/08/19 0349  WBC 7.3 10.0  --  4.3 7.7  NEUTROABS 5.6 8.5*  --  3.1  --   HGB 11.6* 11.8* 10.9* 10.5* 11.5*  HCT  35.9* 36.9* 32.0* 33.8* 36.1*  MCV 84.9 84.6  --  87.8 86.0  PLT 304 322  --  244 251   Basic Metabolic Panel: Recent Labs  Lab 12/03/19 1423 12/06/19 0910 12/07/19 0255 12/07/19 0505 12/08/19 0349  NA 141 138 142 142 142  K 4.0 3.5 3.1* 3.4* 3.4*  CL 109 103  --  107 110  CO2 18* 21*  --  22 20*  GLUCOSE 83 87  --  84 78  BUN 24* 25*  --  20 12  CREATININE 1.20 1.33*  --  1.16 1.15  CALCIUM 9.3 9.0  --  8.2* 8.8*  MG  --   --   --  2.6* 2.2  PHOS  --   --   --   --  2.1*   GFR: Estimated Creatinine Clearance: 88.2 mL/min (by C-G formula based on SCr of 1.15 mg/dL). Liver Function Tests: Recent Labs  Lab 12/03/19 1423 12/06/19 0910 12/07/19 0505 12/08/19 0349  AST 24 43* 33 34  ALT 15 25 21 21   ALKPHOS 76 74 63 72  BILITOT 0.8 1.1 0.8 0.9  PROT 7.8 7.1 6.1* 6.3*  ALBUMIN 4.2 3.7 3.0* 3.0*   No results for input(s): LIPASE, AMYLASE in the last 168 hours. No results for input(s): AMMONIA in the last  168 hours. Coagulation Profile: No results for input(s): INR, PROTIME in the last 168 hours. Cardiac Enzymes: Recent Labs  Lab 12/06/19 2216 12/07/19 0505  CKTOTAL 1,084* 848*   BNP (last 3 results) No results for input(s): PROBNP in the last 8760 hours. HbA1C: No results for input(s): HGBA1C in the last 72 hours. CBG: No results for input(s): GLUCAP in the last 168 hours. Lipid Profile: No results for input(s): CHOL, HDL, LDLCALC, TRIG, CHOLHDL, LDLDIRECT in the last 72 hours. Thyroid Function Tests: No results for input(s): TSH, T4TOTAL, FREET4, T3FREE, THYROIDAB in the last 72 hours. Anemia Panel: No results for input(s): VITAMINB12, FOLATE, FERRITIN, TIBC, IRON, RETICCTPCT in the last 72 hours. Sepsis Labs: Recent Labs  Lab 12/06/19 0910 12/06/19 1150 12/06/19 2218 12/07/19 0506 12/08/19 0349  PROCALCITON  --   --   --   --  <0.10  LATICACIDVEN 2.0* 1.4 1.3 0.8  --     Recent Results (from the past 240 hour(s))  Respiratory Panel by RT PCR (Flu  A&B, Covid) - Nasopharyngeal Swab     Status: None   Collection Time: 12/03/19  2:30 PM   Specimen: Nasopharyngeal Swab  Result Value Ref Range Status   SARS Coronavirus 2 by RT PCR NEGATIVE NEGATIVE Final    Comment: (NOTE) SARS-CoV-2 target nucleic acids are NOT DETECTED.  The SARS-CoV-2 RNA is generally detectable in upper respiratoy specimens during the acute phase of infection. The lowest concentration of SARS-CoV-2 viral copies this assay can detect is 131 copies/mL. A negative result does not preclude SARS-Cov-2 infection and should not be used as the sole basis for treatment or other patient management decisions. A negative result may occur with  improper specimen collection/handling, submission of specimen other than nasopharyngeal swab, presence of viral mutation(s) within the areas targeted by this assay, and inadequate number of viral copies (<131 copies/mL). A negative result must be combined with clinical observations, patient history, and epidemiological information. The expected result is Negative.  Fact Sheet for Patients:  https://www.moore.com/  Fact Sheet for Healthcare Providers:  https://www.young.biz/  This test is no t yet approved or cleared by the [place] FDA and  has been authorized for detection and/or diagnosis of SARS-CoV-2 by FDA under an Emergency Use Authorization (EUA). This EUA will remain  in effect (meaning this test can be used) for the duration of the COVID-19 declaration under Section 564(b)(1) of the Act, 21 U.S.C. section 360bbb-3(b)(1), unless the authorization is terminated or revoked sooner.     Influenza A by PCR NEGATIVE NEGATIVE Final   Influenza B by PCR NEGATIVE NEGATIVE Final    Comment: (NOTE) The Xpert Xpress SARS-CoV-2/FLU/RSV assay is intended as an aid in  the diagnosis of influenza from Nasopharyngeal swab specimens and  should not be used as a sole basis for treatment. Nasal  washings and  aspirates are unacceptable for Xpert Xpress SARS-CoV-2/FLU/RSV  testing.  Fact Sheet for Patients: https://www.moore.com/  Fact Sheet for Healthcare Providers: https://www.young.biz/  This test is not yet approved or cleared by the [place] FDA and  has been authorized for detection and/or diagnosis of SARS-CoV-2 by  FDA under an Emergency Use Authorization (EUA). This EUA will remain  in effect (meaning this test can be used) for the duration of the  Covid-19 declaration under Section 564(b)(1) of the Act, 21  U.S.C. section 360bbb-3(b)(1), unless the authorization is  terminated or revoked. Performed at [facility], [address], [place], [place] [address]  Radiology Studies: No results found. Scheduled Meds: . benztropine mesylate  2 mg Intravenous Daily  . divalproex  750 mg Oral Q12H  . enoxaparin (LOVENOX) injection  40 mg Subcutaneous Q24H  . FLUoxetine  40 mg Oral Daily  . multivitamin with minerals  1 tablet Oral Daily  . propranolol ER  60 mg Oral Daily   Continuous Infusions:    LOS: 3 days   Time spent:  Azucena Fallen, DO [facility]  If 7PM-7AM, please contact night-coverage [email or web address]  12/09/2019, 7:34 AM

## 2019-12-10 LAB — COMPREHENSIVE METABOLIC PANEL
ALT: 24 U/L (ref 0–44)
AST: 33 U/L (ref 15–41)
Albumin: 2.9 g/dL — ABNORMAL LOW (ref 3.5–5.0)
Alkaline Phosphatase: 64 U/L (ref 38–126)
Anion gap: 9 (ref 5–15)
BUN: 12 mg/dL (ref 6–20)
CO2: 26 mmol/L (ref 22–32)
Calcium: 9 mg/dL (ref 8.9–10.3)
Chloride: 106 mmol/L (ref 98–111)
Creatinine, Ser: 0.96 mg/dL (ref 0.61–1.24)
GFR, Estimated: >60 mL/min (ref >60)
Glucose, Bld: 122 mg/dL — ABNORMAL HIGH (ref 70–99)
Potassium: 3.8 mmol/L (ref 3.5–5.1)
Sodium: 141 mmol/L (ref 135–145)
Total Bilirubin: 0.4 mg/dL (ref 0.3–1.2)
Total Protein: 6.3 g/dL — ABNORMAL LOW (ref 6.5–8.1)

## 2019-12-10 LAB — CBC
HCT: 37.3 % — ABNORMAL LOW (ref 39.0–52.0)
Hemoglobin: 11.8 g/dL — ABNORMAL LOW (ref 13.0–17.0)
MCH: 27.3 pg (ref 26.0–34.0)
MCHC: 31.6 g/dL (ref 30.0–36.0)
MCV: 86.3 fL (ref 80.0–100.0)
Platelets: 275 10*3/uL (ref 150–400)
RBC: 4.32 MIL/uL (ref 4.22–5.81)
RDW: 12.6 % (ref 11.5–15.5)
WBC: 5.6 10*3/uL (ref 4.0–10.5)
nRBC: 0 % (ref 0.0–0.2)

## 2019-12-10 MED ORDER — OLANZAPINE 10 MG PO TBDP
10.0000 mg | ORAL_TABLET | Freq: Every day | ORAL | 0 refills | Status: DC | PRN
Start: 2019-12-10 — End: 2019-12-10

## 2019-12-10 MED ORDER — DIVALPROEX SODIUM 250 MG PO DR TAB: 750.0000 mg | DELAYED_RELEASE_TABLET | Freq: Two times a day (BID) | ORAL | 0 refills | Status: DC

## 2019-12-10 MED ORDER — PROPRANOLOL HCL ER 60 MG PO CP24
60.0000 mg | ORAL_CAPSULE | Freq: Every day | ORAL | 0 refills | Status: DC
Start: 2019-12-10 — End: 2019-12-14

## 2019-12-10 MED ORDER — PROPRANOLOL HCL ER 60 MG PO CP24
60.0000 mg | ORAL_CAPSULE | Freq: Every day | ORAL | 0 refills | Status: DC
Start: 2019-12-10 — End: 2019-12-10

## 2019-12-10 MED ORDER — OLANZAPINE 10 MG PO TBDP
10.0000 mg | ORAL_TABLET | Freq: Every day | ORAL | 0 refills | Status: DC | PRN
Start: 2019-12-10 — End: 2019-12-14

## 2019-12-10 NOTE — TOC Transition Note (Signed)
Transition of Care (TOC) - CM/SW Discharge Note Donn Pierini RN,BSN Transitions of Care Unit 4NP (non trauma) - RN Case Manager See Treatment Team for direct Phone #   Patient Details  Name: Brian Stafford MRN: [id number] Date of Birth: Apr 25, 1975  Transition of Care [facility]) CM/SW Contact:  Darrold Span, RN Phone Number: 12/10/2019, 11:52 AM   Clinical Narrative:    Pt stable for transition back to group home today, call received from [facility] pharmacy regarding pt's prescription drug coverage- they are trying to fill meds prior to discharge- no coverage found in epic- per Group Home pt uses [facility] [phone]) - call made to pharmacy and spoke with [facility] regarding pt's drug coverage- per Vanice Sarah pt has several drug coverage plans listed in their system. Primary coverage plan is [employer] [id number], PCN [id number], Policy # [id number], group# [id number], pt also has Medicaid policy as secondary- [id number] L - BIN [id number]- info provided to [facility], however they still are unable to pull info to fill meds- TOC to transfer scripts to outside pharmacy- [facility] for them to fill and have delivered to Group Home later today.   Group Home to send transportation to transport pt back to Group Home.   Final next level of care: Group Home Barriers to Discharge: No Barriers Identified   Patient Goals and CMS Choice   N/A      Discharge Placement               Group Home        Discharge Plan and Services   Discharge Planning Services: CM Consult, Medication Assistance                                 Social Determinants of Health (SDOH) Interventions     Readmission Risk Interventions Readmission Risk Prevention Plan 12/10/2019  Transportation Screening Complete  PCP or Specialist Appt within 5-7 Days Complete  Home Care Screening Complete  Medication Review (RN CM) Complete  Some recent data might be hidden

## 2019-12-10 NOTE — Evaluation (Addendum)
Physical Therapy Evaluation Patient Details Name: Brian Stafford MRN: [id number] DOB: [birth year] Today's Date: 12/10/2019   History of Present Illness  Patient is a 44 y.o. male with PMH: schizophrenia, bipolar disorder, anxiety, intellectual disability, who presented to hospital from group home where he lives with new onset seizures which were witnessed by group home staff. UA negative for UTI, CT head negative for intracranial acute etiology. In hospital, he had decreased responsiveness and respirations after receiving Ativan but did not require intubation.   Clinical Impression  Pt is not at baseline functioning, but should be safe at home with supervision from staff and therapy follow up.. There are no further acute PT needs.  Will sign off at this time.     Follow Up Recommendations SNF;Other (comment) (unless pt has 24 hour supervision/assist then mayber HHPT)    Equipment Recommendations  Other (comment) (TBA)    Recommendations for Other Services       Precautions / Restrictions Precautions Precautions: Fall      Mobility  Bed Mobility Overal bed mobility: Needs Assistance Bed Mobility: Supine to Sit     Supine to sit: Supervision;Modified independent (Device/Increase time)     General bed mobility comments: Problems with initiation, needing directional cuing, but no assist to get to EOB    Transfers Overall transfer level: Needs assistance   Transfers: Sit to/from Stand Sit to Stand: Min guard;Supervision         General transfer comment: mildly unsteady, but no assist.  Mild stability assist once up.  Ambulation/Gait Ambulation/Gait assistance: Min assist;Min guard Gait Distance (Feet): 300 Feet Assistive device: None Gait Pattern/deviations: Step-through pattern;Decreased stride length;Drifts right/left   Gait velocity interpretation: 1.31 - 2.62 ft/sec, indicative of limited community ambulator General Gait Details: mild to moderate instability  at times with drift right, episodes of stagger/scissoring to recover stability.  Needed stability assist for deviation on occasion and to use the walls to self recover.  Stairs Stairs: Yes Stairs assistance: Min guard Stair Management: One rail Left;Alternating pattern;Forwards Number of Stairs: 5 General stair comments: safe enough with rails  Wheelchair Mobility    Modified Rankin (Stroke Patients Only)       Balance Overall balance assessment: Needs assistance   Sitting balance-Leahy Scale: Good     Standing balance support: Single extremity supported;No upper extremity supported Standing balance-Leahy Scale: Fair (to poor dynamically)                               Pertinent Vitals/Pain Pain Assessment: No/denies pain    Home Living Family/patient expects to be discharged to:: Group home Living Arrangements: Group Home                    Prior Function Level of Independence: Independent         Comments: Independent in group home environment as far as ADL's.  No one to discuss iADL's     Hand Dominance        Extremity/Trunk Assessment   Upper Extremity Assessment Upper Extremity Assessment: Overall WFL for tasks assessed    Lower Extremity Assessment Lower Extremity Assessment: Overall WFL for tasks assessed (general proximal weakness)       Communication   Communication: No difficulties  Cognition Arousal/Alertness: Awake/alert Behavior During Therapy: WFL for tasks assessed/performed Overall Cognitive Status: History of cognitive impairments - at baseline  General Comments General comments (skin integrity, edema, etc.): pt does have a min to mod risk for falls    Exercises     Assessment/Plan    PT Assessment All further PT needs can be met in the next venue of care  PT Problem List Decreased activity tolerance;Decreased balance;Decreased mobility;Decreased safety  awareness       PT Treatment Interventions      PT Goals (Current goals can be found in the Care Plan section)  Acute Rehab PT Goals PT Goal Formulation: All assessment and education complete, DC therapy    Frequency     Barriers to discharge        Co-evaluation               AM-PAC PT "6 Clicks" Mobility  Outcome Measure Help needed turning from your back to your side while in a flat bed without using bedrails?: None Help needed moving from lying on your back to sitting on the side of a flat bed without using bedrails?: None Help needed moving to and from a bed to a chair (including a wheelchair)?: A Little Help needed standing up from a chair using your arms (e.g., wheelchair or bedside chair)?: A Lot Help needed to walk in hospital room?: A Little Help needed climbing 3-5 steps with a railing? : A Little 6 Click Score: 19    End of Session   Activity Tolerance: Patient tolerated treatment well Patient left: in chair;with call bell/phone within reach;with nursing/sitter in room Nurse Communication: Mobility status PT Visit Diagnosis: Unsteadiness on feet (R26.81);Other abnormalities of gait and mobility (R26.89);Difficulty in walking, not elsewhere classified (R26.2);Other symptoms and signs involving the nervous system (R29.898)    Time: 8473-0856 PT Time Calculation (min) (ACUTE ONLY): 23 min   Charges:   PT Evaluation $PT Eval Moderate Complexity: 1 Mod PT Treatments $Gait Training: 8-22 mins        12/10/2019  Ginger Carne., PT Acute Rehabilitation Services [phone]  (pager) [phone]  (office)  Tessie Fass Alanys Godino 12/10/2019, 3:02 PM

## 2019-12-10 NOTE — Progress Notes (Signed)
PROGRESS NOTE    Brian Stafford  ZOX:[id number] DOB: [birth year] DOA: 12/06/2019 PCP: Fleet ContrasAvbuere, Edwin, MD   Brief Narrative:  Brian Stafford is a 44 y.o. male with medical history significant of schizophrenia, bipolar disorder, mild electro disability disorder, anxiety, presented with new onset of seizures. Patient remained confused probably from seizure and postictal state, most history obtained from ED staff and review of patient's chart. Patient started to have visual and auditory hallucination "seeing and hearing from demons" 3 days ago and came to ED for evaluation.  Patient moved to live in a couple 3 to 4 months ago, and has had since experiencing twitching like movement of lips and tongue as well as uncontrolled movements of his legs.  Patient was evaluated by psychiatrist 2 days ago in the ED, who found out that patient has been of psychiatry medication for last 3 months, at the same time, it was found the patient has involuntary tongue movement, which was improved after given 1 dose of Cogentin 1 mg IM. Patient was sent back to group home and starting psychiatric medications. However, it appears that the patient continues to have bizarre movement and hallucinations, group home called EMS this morning. EMS arrived and found the patient had seizure with whole body shaking for about 2 minutes. Patient had another witnessed seizure in the ED witnessed by the nurse, with brief episode of hypoxia oxygenation dropped to 70s. Whole episode lasted about a minute. CT head negative for intracranial acute etiology. Blood work, WBC 10, UDS negative, UA negative for UTI.  12/07/2019 rapid response overnight soon after admission - obtunded after ativan - no improvement with narcan or flumazanil but he is protecting airway - no intubation indicated. 12/08/2019 -much more awake alert oriented this morning appears to be back to baseline, concern for polypharmacy as such psychiatry was requested to follow along  for medication management recommending decrease dose of clozapine 12/09/2019 -continues to tolerate new medication doses and therapy, likely disposition the next 24 to 48 hours pending clinical course, tolerance of new medications, lack of new seizure activity and safe disposition location, likely back to group home. 12/10/19 -set up for discharge back to group home, unfortunately PT recommended 24-hour care which group home cannot provide thus patient is currently being evaluated for SNF placement, case management working diligently on safe location for disposition.  Patient clinically and medically stable for discharge at this time awaiting safe disposition.  Assessment & Plan:   Active Problems:   Seizure (HCC)   New onset seizure, unclear etiology, POA Keppra loading completed in the ED Questionably in the setting of high-dose clozapine per discussion with psych/pharmacy given suspected noncompliance with clozapine (off for 3 months then resumed high dose) -clozapine has been discontinued at this time with appropriate improvement in symptoms Neurology recommend continue Depakote 750 twice daily CT head negative  Acute encephalopathy, rule out metabolic versus toxic etiology, resolved Likely postictal given above, concern for polypharmacy however patient did not respond to flumazenil or Narcan Continue supportive care, imaging negative thus far Awake alert oriented this morning back to baseline  Involuntary body movement, improving -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 intake with lactic acidosis -Continue IV fluids, supportive care  Schizophrenia -Psychiatry on board, on  antipsychiatric medications -  clozapine and Geodon at intake -Concern for polypharmacy or even toxic ingestion given symptoms as above, defer to psychiatry for weaning or increasing patient's doses per their expertise -Transition olanzapine to as needed, clozapine discontinued  DVT prophylaxis: Lovenox Code Status: Full code Family Communication: None available  Status is: Inpatient  Dispo: The patient is from: Group home              Anticipated d/c is to: TBD               Anticipated d/c date is: 24h pending clinical course              Patient currently is medically stable for discharge  Consultants:   Psychiatry, neurology  Procedures:   None indicated  Antimicrobials:  None indicated  Subjective: No acute issues or events overnight, ANO x3 denies headache, fevers, chills, nausea, vomiting, diarrhea, constipation  Objective: Vitals:   12/10/19 0100 12/10/19 0142 12/10/19 0400 12/10/19 0800  BP:   118/61 112/70  Pulse: 72 86 70 67  Resp: (!) 24 16 19 12   Temp:   98.1 F (36.7 C) 98.2 F (36.8 C)  TempSrc:   Oral Oral  SpO2: 97% 98% 99% 98%  Weight:      Height:        Intake/Output Summary (Last 24 hours) at 12/10/2019 1519 Last data filed at 12/10/2019 0245 Gross per 24 hour  Intake --  Output 1175 ml  Net -1175 ml   Filed Weights   12/06/19 2052  Weight: 74.5 kg    Examination:  General:  Pleasantly resting in bed, No acute distress. HEENT:  Normocephalic atraumatic.  Sclerae nonicteric, noninjected.  Extraocular movements intact bilaterally. Neck:  Without mass or deformity.  Trachea is midline. Lungs:  Clear to auscultate bilaterally without rhonchi, wheeze, or rales. Heart:  Regular rate and rhythm.  Without murmurs, rubs, or gallops. Abdomen:  Soft, nontender, nondistended.  Without guarding or rebound. Extremities: Without cyanosis, clubbing, edema, or obvious deformity. Vascular:  Dorsalis pedis and posterior tibial pulses palpable  bilaterally. Skin:  Warm and dry, no erythema, no ulcerations.   Data Reviewed: I have personally reviewed following labs and imaging studies  CBC: Recent Labs  Lab 12/06/19 0910 12/06/19 0910 12/07/19 0255 12/07/19 0505 12/08/19 0349 12/09/19 0808 12/10/19 0200  WBC 10.0  --   --  4.3 7.7 6.1 5.6  NEUTROABS 8.5*  --   --  3.1  --   --   --   HGB 11.8*   < > 10.9* 10.5* 11.5* 11.5* 11.8*  HCT 36.9*   < > 32.0* 33.8* 36.1* 36.3* 37.3*  MCV 84.6  --   --  87.8 86.0 85.8 86.3  PLT 322  --   --  244 251 273 275   < > = values in this interval not displayed.   Basic Metabolic Panel: Recent Labs  Lab 12/06/19 0910 12/06/19 0910 12/07/19 0255 12/07/19 0505 12/08/19 0349 12/09/19 0808 12/10/19 0200  NA 138   < > 142 142 142 142 141  K 3.5   < > 3.1* 3.4* 3.4* 3.4* 3.8  CL 103  --   --  107 110 104 106  CO2 21*  --   --  22 20* 30 26  GLUCOSE 87  --   --  84 78 104* 122*  BUN 25*  --   --  20 12 7 12   CREATININE 1.33*  --   --  1.16 1.15  0.94 0.96  CALCIUM 9.0  --   --  8.2* 8.8* 9.0 9.0  MG  --   --   --  2.6* 2.2  --   --   PHOS  --   --   --   --  2.1*  --   --    < > = values in this interval not displayed.   GFR: Estimated Creatinine Clearance: 105.6 mL/min (by C-G formula based on SCr of 0.96 mg/dL). Liver Function Tests: Recent Labs  Lab 12/06/19 0910 12/07/19 0505 12/08/19 0349 12/09/19 0808 12/10/19 0200  AST 43* 33 34 34 33  ALT 25 21 21 21 24   ALKPHOS 74 63 72 60 64  BILITOT 1.1 0.8 0.9 0.5 0.4  PROT 7.1 6.1* 6.3* 6.6 6.3*  ALBUMIN 3.7 3.0* 3.0* 3.1* 2.9*   No results for input(s): LIPASE, AMYLASE in the last 168 hours. No results for input(s): AMMONIA in the last 168 hours. Coagulation Profile: No results for input(s): INR, PROTIME in the last 168 hours. Cardiac Enzymes: Recent Labs  Lab 12/06/19 2216 12/07/19 0505  CKTOTAL 1,084* 848*   BNP (last 3 results) No results for input(s): PROBNP in the last 8760 hours. HbA1C: No results for  input(s): HGBA1C in the last 72 hours. CBG: No results for input(s): GLUCAP in the last 168 hours. Lipid Profile: No results for input(s): CHOL, HDL, LDLCALC, TRIG, CHOLHDL, LDLDIRECT in the last 72 hours. Thyroid Function Tests: No results for input(s): TSH, T4TOTAL, FREET4, T3FREE, THYROIDAB in the last 72 hours. Anemia Panel: No results for input(s): VITAMINB12, FOLATE, FERRITIN, TIBC, IRON, RETICCTPCT in the last 72 hours. Sepsis Labs: Recent Labs  Lab 12/06/19 0910 12/06/19 1150 12/06/19 2218 12/07/19 0506 12/08/19 0349  PROCALCITON  --   --   --   --  <0.10  LATICACIDVEN 2.0* 1.4 1.3 0.8  --     Recent Results (from the past 240 hour(s))  Respiratory Panel by RT PCR (Flu A&B, Covid) - Nasopharyngeal Swab     Status: None   Collection Time: 12/03/19  2:30 PM   Specimen: Nasopharyngeal Swab  Result Value Ref Range Status   SARS Coronavirus 2 by RT PCR NEGATIVE NEGATIVE Final    Comment: (NOTE) SARS-CoV-2 target nucleic acids are NOT DETECTED.  The SARS-CoV-2 RNA is generally detectable in upper respiratoy specimens during the acute phase of infection. The lowest concentration of SARS-CoV-2 viral copies this assay can detect is 131 copies/mL. A negative result does not preclude SARS-Cov-2 infection and should not be used as the sole basis for treatment or other patient management decisions. A negative result may occur with  improper specimen collection/handling, submission of specimen other than nasopharyngeal swab, presence of viral mutation(s) within the areas targeted by this assay, and inadequate number of viral copies (<131 copies/mL). A negative result must be combined with clinical observations, patient history, and epidemiological information. The expected result is Negative.  Fact Sheet for Patients:  13/02/21  Fact Sheet for Healthcare Providers:  https://www.moore.com/  This test is no t yet approved or  cleared by the https://www.young.biz/ FDA and  has been authorized for detection and/or diagnosis of SARS-CoV-2 by FDA under an Emergency Use Authorization (EUA). This EUA will remain  in effect (meaning this test can be used) for the duration of the COVID-19 declaration under Section 564(b)(1) of the Act, 21 U.S.C. section 360bbb-3(b)(1), unless the authorization is terminated or revoked sooner.     Influenza A by PCR NEGATIVE NEGATIVE  Final   Influenza B by PCR NEGATIVE NEGATIVE Final    Comment: (NOTE) The Xpert Xpress SARS-CoV-2/FLU/RSV assay is intended as an aid in  the diagnosis of influenza from Nasopharyngeal swab specimens and  should not be used as a sole basis for treatment. Nasal washings and  aspirates are unacceptable for Xpert Xpress SARS-CoV-2/FLU/RSV  testing.  Fact Sheet for Patients: https://www.moore.com/  Fact Sheet for Healthcare Providers: https://www.young.biz/  This test is not yet approved or cleared by the [place] FDA and  has been authorized for detection and/or diagnosis of SARS-CoV-2 by  FDA under an Emergency Use Authorization (EUA). This EUA will remain  in effect (meaning this test can be used) for the duration of the  Covid-19 declaration under Section 564(b)(1) of the Act, 21  U.S.C. section 360bbb-3(b)(1), unless the authorization is  terminated or revoked. Performed at [facility], [address], [place], [place] [address]          Radiology Studies: No results found. Scheduled Meds: . benztropine mesylate  2 mg Intravenous Daily  . divalproex  750 mg Oral Q12H  . enoxaparin (LOVENOX) injection  40 mg Subcutaneous Q24H  . FLUoxetine  40 mg Oral Daily  . multivitamin with minerals  1 tablet Oral Daily  . propranolol ER  60 mg Oral Daily   Continuous Infusions:    LOS: 4 days   Time spent:  Azucena Fallen, DO [facility]  If 7PM-7AM, please contact  night-coverage [email or web address]  12/10/2019, 3:19 PM

## 2019-12-10 NOTE — Care Management Important Message (Signed)
Important Message  Patient Details  Name: Brian Stafford MRN: [id number] Date of Birth: 1975-08-27   Medicare Important Message Given:  Yes     [name] 12/10/2019, 1:43 PM

## 2019-12-10 NOTE — Social Work (Addendum)
CSW spoke with Ms Brian Stafford- she advised the patient's pharmacy is [facility] in [place], fax # [phone]. She requested CSW call [name] to inform the patient is discharging today.  Patient is from [facility]- when the patient is ready for discharge contact [name] # [phone], they will pick up patient.   Antony Blackbird, MSW, LCSWA Clinical Social Worker

## 2019-12-10 NOTE — TOC Progression Note (Signed)
Transition of Care (TOC) - Progression Note  Donn Pierini RN,BSN Transitions of Care Unit 4NP (non trauma) - RN Case Manager See Treatment Team for direct Phone #   Patient Details  Name: Brian Stafford MRN: [id number] Date of Birth: November 12, 1975  Transition of Care [facility]) CM/SW Contact  Zenda Alpers, Lenn Sink, RN Phone Number: 12/10/2019, 3:55 PM  Clinical Narrative:    Noted PT recs for discharge, call made to Group Home and spoke with [name] along with CSW there. They informed this writer that pt does not have 24/7 assistance at the group home nor can 24/7 assistance be provided. They report that there is no one there at the group home to assist during the day. They also report that they would have to check with Group Home owner regarding any HH services but are unsure that [facility] services can be provided at the Group Home. At this time [name] and the CSW do not feel pt is safe to return to Group Home at his current mobility level.  Notified attending MD, and spoke with CSW regarding barriers to return to Group Home.  Will cancel discharge and work on safe disposition.    Expected Discharge Plan: Group Home Barriers to Discharge: No Barriers Identified  Expected Discharge Plan and Services Expected Discharge Plan: Group Home   Discharge Planning Services: CM Consult, Medication Assistance   Living arrangements for the past 2 months: Group Home Expected Discharge Date: 12/10/19                                     Social Determinants of Health (SDOH) Interventions    Readmission Risk Interventions Readmission Risk Prevention Plan 12/10/2019  Transportation Screening Complete  PCP or Specialist Appt within 5-7 Days Complete  Home Care Screening Complete  Medication Review (RN CM) Complete  Some recent data might be hidden

## 2019-12-11 DIAGNOSIS — R45851 Suicidal ideations: Secondary | ICD-10-CM

## 2019-12-11 DIAGNOSIS — G9341 Metabolic encephalopathy: Secondary | ICD-10-CM

## 2019-12-11 LAB — CBC
HCT: 35 % — ABNORMAL LOW (ref 39.0–52.0)
Hemoglobin: 11.3 g/dL — ABNORMAL LOW (ref 13.0–17.0)
MCH: 27.3 pg (ref 26.0–34.0)
MCHC: 32.3 g/dL (ref 30.0–36.0)
MCV: 84.5 fL (ref 80.0–100.0)
Platelets: 304 10*3/uL (ref 150–400)
RBC: 4.14 MIL/uL — ABNORMAL LOW (ref 4.22–5.81)
RDW: 12.6 % (ref 11.5–15.5)
WBC: 4.8 10*3/uL (ref 4.0–10.5)
nRBC: 0 % (ref 0.0–0.2)

## 2019-12-11 LAB — COMPREHENSIVE METABOLIC PANEL
ALT: 24 U/L (ref 0–44)
AST: 27 U/L (ref 15–41)
Albumin: 2.8 g/dL — ABNORMAL LOW (ref 3.5–5.0)
Alkaline Phosphatase: 57 U/L (ref 38–126)
Anion gap: 7 (ref 5–15)
BUN: 13 mg/dL (ref 6–20)
CO2: 28 mmol/L (ref 22–32)
Calcium: 9 mg/dL (ref 8.9–10.3)
Chloride: 105 mmol/L (ref 98–111)
Creatinine, Ser: 1 mg/dL (ref 0.61–1.24)
GFR, Estimated: >60 mL/min (ref >60)
Glucose, Bld: 107 mg/dL — ABNORMAL HIGH (ref 70–99)
Potassium: 3.7 mmol/L (ref 3.5–5.1)
Sodium: 140 mmol/L (ref 135–145)
Total Bilirubin: 0.6 mg/dL (ref 0.3–1.2)
Total Protein: 6.2 g/dL — ABNORMAL LOW (ref 6.5–8.1)

## 2019-12-11 MED ORDER — ENSURE ENLIVE PO LIQD
237.0000 mL | Freq: Two times a day (BID) | ORAL | Status: DC
  Administered 2019-12-11 – 2019-12-13 (×5): 237 mL via ORAL

## 2019-12-11 MED ORDER — OLANZAPINE 5 MG PO TBDP
10.0000 mg | ORAL_TABLET | Freq: Every day | ORAL | Status: DC
  Administered 2019-12-11 – 2019-12-13 (×3): 10 mg via ORAL
  Filled 2019-12-11 (×4): qty 2

## 2019-12-11 NOTE — Consult Note (Signed)
[facility] Face-to-Face Psychiatry Consult   Reason for Consult:  PAtient threatening to commit suicide.  Evaluate patient for inpatient psych stay.  Help with psychiatric medication Referring Physician:  Dr, Joseph Art Patient Identification: Brian Stafford MRN:  [id number] Principal Diagnosis: <principal problem not specified> Diagnosis:  Active Problems:   Seizure [facility])   Total Time spent with patient: 30 minutes  Subjective:   Brian Stafford is a 44 y.o. male patient admitted with .  Patient is well-known to our service, and per chart review has a existing diagnosis of schizophrenia, intellectual disability, anxiety, and bipolar 1 disorder.  On evaluation he is observed to be sitting upright in bed eating his lunch.  He denies current suicidal ideations, and denies any reference or thought of suicidal ideation during this hospital admission.  He denies any current thoughts of harming others.  He acknowledges experiencing auditory and visual hallucinations, which are chronic in nature.  He reports compliance with his medication.  Patient is currently living in a group home, and has exhibited no change in behavior as well as seizures.  During the evaluation he is alert and oriented, appears to be at his baseline.  His current behaviors are normal, he makes good eye contact, and his mood is euthymic.  His thought process is irrelevant and delusional at times.  He does not appear to be responding to internal stimuli.  He denies suicidal ideations, homicidal ideations.  He is able to contract for safety while in the hospital.  It should be noted patient does have intellectual disability and therefore his psychiatric presentation may fluctuate at times in validity and overall mental limitations..  As psych consult was placed for suicidal ideations, in which he currently denies.  HPI:  Brian Gibbon Downingis a 44 y.o.malewith medical history significant ofschizophrenia, bipolar disorder, mild electro  disability disorder, anxiety, presented with new onset of seizures. Patient remained confused probably from seizure and postictal state, most history obtained from ED staff and review of patient's chart. Patient started to have visual and auditory hallucination"seeing and hearing from demons"3 days ago and came to ED for evaluation.Patient moved to live in a couple 3 to 4 months ago, and has had since experiencing twitching like movement of lips and tongue as well as uncontrolled movements of his legs. 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 Patient was sent back to group home and starting psychiatric medications. However, it appears that the patient continues to have bizarre movement and hallucinations, group homecalledEMS this morning. EMS arrived and found the patient had seizure with whole body shaking for about 2 minutes.Patient had another witnessed seizure in the ED witnessed by the nurse, with brief episode of hypoxia oxygenation dropped to 70s. Whole episode lasted about a minute. CT head negative for intracranial acute etiology. Blood work, WBC 10, UDS negative, UA negative for UTI.  Past Psychiatric History: Schizophrenia, intellectual disability disorder, bipolar 1 disorder, and anxiety.  His current IQ status is unknown.  Previous psychiatric medications include clozapine, Cogentin, Depakote.   Risk to Self:  Denies Risk to Others:  Denies Prior Inpatient Therapy:  Denies Prior Outpatient Therapy:  Unable to assess  Past Medical History:  Past Medical History:  Diagnosis Date   Anxiety    Bipolar 1  disorder (HCC)    Mental retardation    Schizophrenia (HCC)    History reviewed. No pertinent surgical history. Family History:  Family History   Family history unknown: Yes   Family Psychiatric  History: Unable to assess Social History:  Social History   Substance and Sexual Activity  Alcohol Use Not Currently   Comment: UTA     Social History   Substance and Sexual Activity  Drug Use Not Currently   Comment: UTA    Social History   Socioeconomic History   Marital status: Single    Spouse name: Not on file   Number of children: Not on file   Years of education: Not on file   Highest education level: Not on file  Occupational History   Not on file  Tobacco Use   Smoking status: Unknown If Ever Smoked   Smokeless tobacco: Never Used   Tobacco comment: UTA  Vaping Use   Vaping Use: Unknown  Substance and Sexual Activity   Alcohol use: Not Currently    Comment: UTA   Drug use: Not Currently    Comment: UTA   Sexual activity: Not on file    Comment: UTA  Other Topics Concern   Not on file  Social History Narrative   Not on file   Social Determinants of Health   Financial Resource Strain:    Difficulty of Paying Living Expenses: Not on file  Food Insecurity:    Worried About Programme researcher, broadcasting/film/videounning Out of Food in the Last Year: Not on file   [employer] Out of Food in the Last Year: Not on file  Transportation Needs:    Lack of Transportation (Medical): Not on file   Lack of Transportation (Non-Medical): Not on file  Physical Activity:    Days of Exercise per Week: Not on file   Minutes of Exercise per Session: Not on file  Stress:    Feeling of Stress : Not on file  Social Connections:    Frequency of Communication with Friends and Family: Not on file   Frequency of Social Gatherings with Friends and Family: Not on file   Attends Religious Services: Not on file   Active Member of Clubs or Organizations: Not on file   Attends BankerClub or Organization Meetings: Not on file   Marital Status: Not on file   Additional Social History:    Allergies:  No Known Allergies  Labs:  Results for orders  placed or performed during the hospital encounter of 12/06/19 (from the past 48 hour(s))  CBC     Status: Abnormal   Collection Time: 12/10/19  2:00 AM  Result Value Ref Range   WBC 5.6 4.0 - 10.5 K/uL   RBC 4.32 4.22 - 5.81 MIL/uL   Hemoglobin 11.8 (L) 13.0 - 17.0 g/dL   HCT 98.137.3 (L) 39 - 52 %   MCV 86.3 80.0 - 100.0 fL   MCH 27.3 26.0 - 34.0 pg   MCHC 31.6 30.0 - 36.0 g/dL   RDW 19.112.6 47.811.5 - 29.515.5 %   Platelets 275 150 - 400 K/uL   nRBC 0.0 0.0 - 0.2 %    Comment: Performed at [facility], [address] [place], [place] [address]  Comprehensive metabolic panel     Status: Abnormal   Collection Time: 12/10/19  2:00 AM  Result Value Ref Range   Sodium 141 135 - 145 mmol/L   Potassium 3.8 3.5 - 5.1 mmol/L   Chloride 106 98 - 111  mmol/L   CO2 26 22 - 32 mmol/L   Glucose, Bld 122 (H) 70 - 99 mg/dL    Comment: Glucose reference range applies only to samples taken after fasting for at least 8 hours.   BUN 12 6 - 20 mg/dL   Creatinine, Ser 6.96 0.61 - 1.24 mg/dL   Calcium 9.0 8.9 - 29.5 mg/dL   Total Protein 6.3 (L) 6.5 - 8.1 g/dL   Albumin 2.9 (L) 3.5 - 5.0 g/dL   AST 33 15 - 41 U/L   ALT 24 0 - 44 U/L   Alkaline Phosphatase 64 38 - 126 U/L   Total Bilirubin 0.4 0.3 - 1.2 mg/dL   GFR, Estimated >28 >41 mL/min    Comment: (NOTE) Calculated using the CKD-EPI Creatinine Equation (2021)    Anion gap 9 5 - 15    Comment: Performed at [facility], [address], [place], [place] [address]  CBC     Status: Abnormal   Collection Time: 12/11/19  1:27 AM  Result Value Ref Range   WBC 4.8 4.0 - 10.5 K/uL   RBC 4.14 (L) 4.22 - 5.81 MIL/uL   Hemoglobin 11.3 (L) 13.0 - 17.0 g/dL   HCT 10.2 (L) 39 - 52 %   MCV 84.5 80.0 - 100.0 fL   MCH 27.3 26.0 - 34.0 pg   MCHC 32.3 30.0 - 36.0 g/dL   RDW 72.5 36.6 - 44.0 %   Platelets 304 150 - 400 K/uL   nRBC 0.0 0.0 - 0.2 %    Comment: Performed at [facility], [address] [place], [place] [address]  Comprehensive  metabolic panel     Status: Abnormal   Collection Time: 12/11/19  1:27 AM  Result Value Ref Range   Sodium 140 135 - 145 mmol/L   Potassium 3.7 3.5 - 5.1 mmol/L   Chloride 105 98 - 111 mmol/L   CO2 28 22 - 32 mmol/L   Glucose, Bld 107 (H) 70 - 99 mg/dL    Comment: Glucose reference range applies only to samples taken after fasting for at least 8 hours.   BUN 13 6 - 20 mg/dL   Creatinine, Ser 5.95 0.61 - 1.24 mg/dL   Calcium 9.0 8.9 - 63.8 mg/dL   Total Protein 6.2 (L) 6.5 - 8.1 g/dL   Albumin 2.8 (L) 3.5 - 5.0 g/dL   AST 27 15 - 41 U/L   ALT 24 0 - 44 U/L   Alkaline Phosphatase 57 38 - 126 U/L   Total Bilirubin 0.6 0.3 - 1.2 mg/dL   GFR, Estimated >75 >64 mL/min    Comment: (NOTE) Calculated using the CKD-EPI Creatinine Equation (2021)    Anion gap 7 5 - 15    Comment: Performed at [facility], [address], [place], [place] [address]    Current Facility-Administered Medications  Medication Dose Route Frequency Provider Last Rate Last Admin   acetaminophen (TYLENOL) tablet 650 mg  650 mg Oral Q4H PRN Mikey College T, MD   650 mg at 12/09/19 1003   Or   acetaminophen (TYLENOL) suppository 650 mg  650 mg Rectal Q4H PRN Mikey College T, MD       benztropine mesylate (COGENTIN) injection 2 mg  2 mg Intravenous Daily Azucena Fallen, MD   2 mg at 12/11/19 1116   divalproex (DEPAKOTE) DR tablet 750 mg  750 mg Oral Q12H Bhagat, Srishti L, MD   750 mg at  12/11/19 1115   enoxaparin (LOVENOX) injection 40 mg  40 mg Subcutaneous Q24H Mikey College T, MD   40 mg at 12/10/19 1645   FLUoxetine (PROZAC) capsule 40 mg  40 mg Oral Daily Mikey College T, MD   40 mg at 12/11/19 1115   LORazepam (ATIVAN) injection 1 mg  1 mg Intravenous Q6H PRN Suzan Garibaldi, PA-C       multivitamin with minerals tablet 1 tablet  1 tablet Oral Daily Mikey College T, MD   1 tablet at 12/11/19 1115   OLANZapine zydis (ZYPREXA) disintegrating tablet 10 mg  10 mg Oral PRN Thedore Mins, MD   10 mg at  12/10/19 0940   ondansetron (ZOFRAN) tablet 4 mg  4 mg Oral Q6H PRN Mikey College T, MD       Or   ondansetron [facility]) injection 4 mg  4 mg Intravenous Q6H PRN Mikey College T, MD       propranolol ER (INDERAL LA) 24 hr capsule 60 mg  60 mg Oral Daily Mikey College T, MD   60 mg at 12/11/19 1117    Musculoskeletal: Strength & Muscle Tone: within normal limits Gait & Station: normal Patient leans: N/A  Psychiatric Specialty Exam: Physical Exam  Review of Systems  Blood pressure 112/77, pulse 73, temperature 98.1 F (36.7 C), temperature source Oral, resp. rate 18, height 6\' 2"  (1.88 m), weight 74.5 kg, SpO2 100 %.Body mass index is 21.09 kg/m.  General Appearance: Fairly Groomed  Eye Contact:  Good  Speech:  Clear and Coherent and Normal Rate  Volume:  Normal  Mood:  Euthymic  Affect:  Congruent  Thought Process:  Irrelevant, Linear and Descriptions of Associations: Tangential  Orientation:  Other:  Alert and oriented to self and place, appears to be at baseline.  Thought Content:  Illogical and Rumination  Suicidal Thoughts:  No  Homicidal Thoughts:  No  Memory:  Immediate;   Fair Recent;   Fair Remote;   Fair  Judgement:  Impaired  Insight:  Lacking  Psychomotor Activity:  Normal  Concentration:  Concentration: Poor  Recall:  Poor  Fund of Knowledge:  Poor  Language:  Poor  Akathisia:  No  Handed:  Right  AIMS (if indicated):     Assets:  Financial Resources/Insurance Housing Physical Health Resilience  ADL's:  Intact  Cognition:  Impaired,  Mild  Sleep:     44 year old male who presented to the hospital with new onset of seizures, likely secondary to polypharmacy.  Patient with history of schizophrenia, intellectual disability disorder, anxiety disorder, and bipolar which was previously being managed by clozapine, fluoxetine, Depakote, and Cogentin.  Patient with longstanding history of mental illness, in which she was previously residing in a group home however it  was determined by physical therapy that patient needed a higher level of care.  Patient is currently psychiatrically stable, on his current medication and does not warrant inpatient psychiatric admission at this time.  He denies suicidal ideations, homicidal ideations, and or hallucinations at the time of this evaluation.  As previously noted patient with limited mentation secondary to intellectual disability disorder, will waver in his psychiatric presentation that is chronic in nature.   Treatment Plan Summary: Plan   Depakote was restarted on Sunday, will obtain valproic acid level tomorrow morning.  May continue safety sitter for seizures and assistance with ADLs.  It appears patient's clozapine was discontinued due to polypharmacy, possible involuntary dyskinetic movements, and seizures.  Previous recommendations were to  slowly titrate Clozaril versus avoiding abrupt cessation.  However Clozaril has been discontinued as of 3 days ago due to ongoing seizure activity.  Will resume olanzapine 10 mg p.o. nightly at bedtime, to prevent rebound psychosis and worsening of symptoms.   Disposition: No evidence of imminent risk to self or others at present.   Patient does not meet criteria for psychiatric inpatient admission. Supportive therapy provided about ongoing stressors. Discussed crisis plan, support from social network, calling 911, coming to the Emergency Department, and calling Suicide Hotline.  Maryagnes Amos, FNP 12/11/2019 12:13 PM

## 2019-12-11 NOTE — Progress Notes (Signed)
PROGRESS NOTE    DYLLEN MENNING  PJA:[id number] DOB: 08-Nov-1975 DOA: 12/06/2019 PCP: Fleet Contras, MD     Brief Narrative:  Brian Kader Downingis a 44 y.o.BM PMHx schizophrenia, bipolar disorder, mild electro disability disorder, anxiety,   Presented with new onset of seizures. Patient remained confused probably from seizure and postictal state, most history obtained from ED staff and review of patient's chart. Patient started to have visual and auditory hallucination"seeing and hearing from demons"3 days ago and came to ED for evaluation.Patient moved to live in a couple 3 to 4 months ago, and has had since experiencing twitching like movement of lips and tongue as well as uncontrolled movements of his legs. 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 Patient was sent back to group home and starting psychiatric medications. However, it appears that the patient continues to have bizarre movement and hallucinations, group [facility] this morning. EMS arrived and found the patient had seizure with whole body shaking for about 2 minutes.Patient had another witnessed seizure in the ED witnessed by the nurse, with brief episode of hypoxia oxygenation dropped to 70s. Whole episode lasted about a minute. CT head negative for intracranial acute etiology. Blood work, WBC 10, UDS negative, UA negative for UTI.  12/07/2019 rapid response overnight soon after admission - obtunded after ativan - no improvement with narcan or flumazanil but he is protecting airway - no intubation indicated. 12/08/2019 -much more awake alert oriented this morning appears to be back to baseline, concern for polypharmacy as such psychiatry was requested to follow along for medication management  recommending decrease dose of clozapine 12/09/2019 -continues to tolerate new medication doses and therapy, likely disposition the next 24 to 48 hours pending clinical course, tolerance of new medications, lack of new seizure activity and safe disposition location, likely back to group home. 12/10/19 -set up for discharge back to group home, unfortunately PT recommended 24-hour care which group home cannot provide thus patient is currently being evaluated for SNF placement, case management working diligently on safe location for disposition.  Patient clinically and medically stable for discharge at this time awaiting safe disposition.   Subjective: Afebrile overnight, patient stated to his RN that he wanted to kill himself.  However when I spoke with patient stated he did not want to harm himself or anyone else.  A/O x3 (does not know why).   Assessment & Plan: Covid vaccination;   Active Problems:   Seizure (HCC)   New onset seizure, unclear etiology, POA Keppra loading completed in the ED Questionably in the setting of high-dose clozapine per discussion with psych/pharmacy given suspected noncompliance with clozapine (off for 3 months then resumed high dose) -clozapine has been discontinued at this time with appropriate improvement in symptoms -Neurology recommend continue Depakote 750 twice daily -CT head negative  Acute encephalopathy, rule out metabolic versus toxic etiology,  -Resolved -Likely postictal given above, concern for polypharmacy however patient did not respond to flumazenil or Narcan -Continue supportive care, imaging negative thus far -A/O x3 (does not know why), threatening to kill himself to the RN.  This is his baseline?    Involuntary body movement, improving -Chorea vsextrapyramidal syndrome - improving with reduction of medications as outlined above -Unclear if patient was taking his medications at  previous facility, psychiatry involved, recommending decrease  dose of clozapine given its known provocation of seizures -Olanzapine changed to as needed, clozapine discontinued -11/10 resolved  Dehydration, poor p.o. intake with lactic acidosis -Heart healthy diet  Schizophrenia -Psychiatry on board, on antipsychiatric medications -clozapine and Geodon at intake -Concern for polypharmacy or even toxic ingestion given symptoms as above, defer to psychiatry for weaning or increasing patient's doses per their expertise -Transition olanzapine to as needed, clozapine discontinued -Patient last seen by psychiatry on 11/7 for help with medication management.  Suicidal ideation  -Told his RN earlier this morning that he wanted to kill himself, however when I saw patient stated he did not want to harm himself or anyone else. -11/10 consult to psychiatry for help with management; this is part of his schizophrenia vs medication -Will await further recommendations from psychiatry  DVT prophylaxis: Lovenox Code Status: Full Family Communication:  Status is: Inpatient    Dispo: The patient is from: Group home              Anticipated d/c is to: TBD              Anticipated d/c date is:??  Per NCM group home cannot take patient back secondary to medical needs.              Patient currently unstable      Consultants:  Neurology Psychiatry   Procedures/Significant Events:    I have personally reviewed and interpreted all radiology studies and my findings are as above.  VENTILATOR SETTINGS: Room air 11/10 SPO2; 100%   Cultures   Antimicrobials: Anti-infectives (From admission, onward)   None       Devices    LINES / TUBES:      Continuous Infusions:   Objective: Vitals:   12/10/19 2100 12/11/19 0000 12/11/19 0400 12/11/19 0600  BP:  117/63 103/63   Pulse: 78 69 71 75  Resp:  18 18   Temp:  98 F (36.7 C) 98.1 F (36.7 C)   TempSrc:  Oral Oral   SpO2: 100% 99% 100% 100%  Weight:      Height:         Intake/Output Summary (Last 24 hours) at 12/11/2019 6160 Last data filed at 12/10/2019 2130 Gross per 24 hour  Intake 720 ml  Output 1150 ml  Net -430 ml   Filed Weights   12/06/19 2052  Weight: 74.5 kg    Examination:  General: A/O x3 (does not know why) No acute respiratory distress Eyes: negative scleral hemorrhage, negative anisocoria, negative icterus ENT: Negative Runny nose, negative gingival bleeding, Neck:  Negative scars, masses, torticollis, lymphadenopathy, JVD Lungs: Clear to auscultation bilaterally without wheezes or crackles Cardiovascular: Regular rate and rhythm without murmur gallop or rub normal S1 and S2 Abdomen: negative abdominal pain, nondistended, positive soft, bowel sounds, no rebound, no ascites, no appreciable mass Extremities: No significant cyanosis, clubbing, or edema bilateral lower extremities Skin: Negative rashes, lesions, ulcers Psychiatric:  Negative depression, negative anxiety, negative fatigue, negative mania, appears easily confused will initially be talking about one subject and then jumps to a different subject which happened years ago. Central nervous system:  Cranial nerves II through XII intact, tongue/uvula midline, all extremities muscle strength 5/5, sensation intact throughout, negative dysarthria, negative expressive aphasia, negative receptive aphasia.  .     Data Reviewed: Care during the described time interval was provided by me .  I have reviewed this patient's available data, including medical history,  events of note, physical examination, and all test results as part of my evaluation.  CBC: Recent Labs  Lab 12/06/19 0910 12/07/19 0255 12/07/19 0505 12/08/19 0349 12/09/19 0808 12/10/19 0200 12/11/19 0127  WBC 10.0  --  4.3 7.7 6.1 5.6 4.8  NEUTROABS 8.5*  --  3.1  --   --   --   --   HGB 11.8*   < > 10.5* 11.5* 11.5* 11.8* 11.3*  HCT 36.9*   < > 33.8* 36.1* 36.3* 37.3* 35.0*  MCV 84.6  --  87.8 86.0 85.8 86.3  84.5  PLT 322  --  244 251 273 275 304   < > = values in this interval not displayed.   Basic Metabolic Panel: Recent Labs  Lab 12/07/19 0505 12/08/19 0349 12/09/19 0808 12/10/19 0200 12/11/19 0127  NA 142 142 142 141 140  K 3.4* 3.4* 3.4* 3.8 3.7  CL 107 110 104 106 105  CO2 22 20* 30 26 28   GLUCOSE 84 78 104* 122* 107*  BUN 20 12 7 12 13   CREATININE 1.16 1.15 0.94 0.96 1.00  CALCIUM 8.2* 8.8* 9.0 9.0 9.0  MG 2.6* 2.2  --   --   --   PHOS  --  2.1*  --   --   --    GFR: Estimated Creatinine Clearance: 101.4 mL/min (by C-G formula based on SCr of 1 mg/dL). Liver Function Tests: Recent Labs  Lab 12/07/19 0505 12/08/19 0349 12/09/19 0808 12/10/19 0200 12/11/19 0127  AST 33 34 34 33 27  ALT 21 21 21 24 24   ALKPHOS 63 72 60 64 57  BILITOT 0.8 0.9 0.5 0.4 0.6  PROT 6.1* 6.3* 6.6 6.3* 6.2*  ALBUMIN 3.0* 3.0* 3.1* 2.9* 2.8*   No results for input(s): LIPASE, AMYLASE in the last 168 hours. No results for input(s): AMMONIA in the last 168 hours. Coagulation Profile: No results for input(s): INR, PROTIME in the last 168 hours. Cardiac Enzymes: Recent Labs  Lab 12/06/19 2216 12/07/19 0505  CKTOTAL 1,084* 848*   BNP (last 3 results) No results for input(s): PROBNP in the last 8760 hours. HbA1C: No results for input(s): HGBA1C in the last 72 hours. CBG: No results for input(s): GLUCAP in the last 168 hours. Lipid Profile: No results for input(s): CHOL, HDL, LDLCALC, TRIG, CHOLHDL, LDLDIRECT in the last 72 hours. Thyroid Function Tests: No results for input(s): TSH, T4TOTAL, FREET4, T3FREE, THYROIDAB in the last 72 hours. Anemia Panel: No results for input(s): VITAMINB12, FOLATE, FERRITIN, TIBC, IRON, RETICCTPCT in the last 72 hours. Sepsis Labs: Recent Labs  Lab 12/06/19 0910 12/06/19 1150 12/06/19 2218 12/07/19 0506 12/08/19 0349  PROCALCITON  --   --   --   --  <0.10  LATICACIDVEN 2.0* 1.4 1.3 0.8  --     Recent Results (from the past 240 hour(s))   Respiratory Panel by RT PCR (Flu A&B, Covid) - Nasopharyngeal Swab     Status: None   Collection Time: 12/03/19  2:30 PM   Specimen: Nasopharyngeal Swab  Result Value Ref Range Status   SARS Coronavirus 2 by RT PCR NEGATIVE NEGATIVE Final    Comment: (NOTE) SARS-CoV-2 target nucleic acids are NOT DETECTED.  The SARS-CoV-2 RNA is generally detectable in upper respiratoy specimens during the acute phase of infection. The lowest concentration of SARS-CoV-2 viral copies this assay can detect is 131 copies/mL. A negative result does not preclude SARS-Cov-2 infection and should not be used as the sole basis for  treatment or other patient management decisions. A negative result may occur with  improper specimen collection/handling, submission of specimen other than nasopharyngeal swab, presence of viral mutation(s) within the areas targeted by this assay, and inadequate number of viral copies (<131 copies/mL). A negative result must be combined with clinical observations, patient history, and epidemiological information. The expected result is Negative.  Fact Sheet for Patients:  https://www.moore.com/  Fact Sheet for Healthcare Providers:  https://www.young.biz/  This test is no t yet approved or cleared by the [place] FDA and  has been authorized for detection and/or diagnosis of SARS-CoV-2 by FDA under an Emergency Use Authorization (EUA). This EUA will remain  in effect (meaning this test can be used) for the duration of the COVID-19 declaration under Section 564(b)(1) of the Act, 21 U.S.C. section 360bbb-3(b)(1), unless the authorization is terminated or revoked sooner.     Influenza A by PCR NEGATIVE NEGATIVE Final   Influenza B by PCR NEGATIVE NEGATIVE Final    Comment: (NOTE) The Xpert Xpress SARS-CoV-2/FLU/RSV assay is intended as an aid in  the diagnosis of influenza from Nasopharyngeal swab specimens and  should not be used as  a sole basis for treatment. Nasal washings and  aspirates are unacceptable for Xpert Xpress SARS-CoV-2/FLU/RSV  testing.  Fact Sheet for Patients: https://www.moore.com/  Fact Sheet for Healthcare Providers: https://www.young.biz/  This test is not yet approved or cleared by the [place] FDA and  has been authorized for detection and/or diagnosis of SARS-CoV-2 by  FDA under an Emergency Use Authorization (EUA). This EUA will remain  in effect (meaning this test can be used) for the duration of the  Covid-19 declaration under Section 564(b)(1) of the Act, 21  U.S.C. section 360bbb-3(b)(1), unless the authorization is  terminated or revoked. Performed at [facility], [address], [place], [place] [address]          Radiology Studies: No results found.      Scheduled Meds: . benztropine mesylate  2 mg Intravenous Daily  . divalproex  750 mg Oral Q12H  . enoxaparin (LOVENOX) injection  40 mg Subcutaneous Q24H  . FLUoxetine  40 mg Oral Daily  . multivitamin with minerals  1 tablet Oral Daily  . propranolol ER  60 mg Oral Daily   Continuous Infusions:   LOS: 5 days    Time spent:40 min    Andelyn Spade, Roselind Messier, MD [facility] Pager [phone]  If 7PM-7AM, please contact night-coverage [email or web address] Password [facility] 12/11/2019, 7:38 AM

## 2019-12-11 NOTE — Progress Notes (Signed)
RE: Brian Stafford Date of Birth: Apr 25, 1975 Date: 12/11/19  Please be advised that the above-named patient will require a short-term nursing home stay - anticipated 30 days or less for rehabilitation and strengthening.  The plan is for return home.

## 2019-12-11 NOTE — Progress Notes (Signed)
Nutrition Follow-up  DOCUMENTATION CODES:   Not applicable  INTERVENTION:  Provide Ensure Enlive po BID, each supplement provides 350 kcal and 20 grams of protein  Encourage adequate PO intake.  NUTRITION DIAGNOSIS:   Inadequate oral intake related to lethargy/confusion as evidenced by NPO status; ongoing  GOAL:   Patient will meet greater than or equal to 90% of their needs; progressing  MONITOR:   PO intake, Supplement acceptance, Skin, Weight trends, Labs, I & O's  REASON FOR ASSESSMENT:   Malnutrition Screening Tool    ASSESSMENT:   44 year old male with history significant of schizophrenia, bipolar disorder, mild electro disability disorder, and anxiety who was recently seen in ED 3 days prior due to visual and auditory hallucinations as well as twitching movements of lips, tongue, and legs. Pt evaluated by psychiatry and found that he had been off of psychiatry medication x 3 months, involuntary movements improved s/p IM Cogentin and discharged back to group home. Pt presents with new onset seizures.  Pt reports having a good appetite. Pt tolerating his diet PO. RD to order nutritional supplements to aid in caloric and protein needs. Pt encouraged to eat his food at meals. Labs and medications reviewed.   Diet Order:   Diet Order            Diet - low sodium heart healthy           Diet Heart Room service appropriate? Yes; Fluid consistency: Thin  Diet effective now                 EDUCATION NEEDS:   No education needs have been identified at this time  Skin:  Skin Assessment: Reviewed RN Assessment  Last BM:  Unknown  Height:   Ht Readings from Last 1 Encounters:  12/06/19 6\' 2"  (1.88 m)    Weight:   Wt Readings from Last 1 Encounters:  12/06/19 74.5 kg    BMI:  Body mass index is 21.09 kg/m.  Estimated Nutritional Needs:   Kcal:  2200-2400  Protein:  115-125  Fluid:  >/= 2.2 L  13/05/21, MS, RD, LDN RD pager number/after  hours weekend pager number on Amion.

## 2019-12-11 NOTE — NC FL2 (Signed)
[place] MEDICAID FL2 LEVEL OF CARE SCREENING TOOL     IDENTIFICATION  Patient Name: Brian Stafford Birthdate: 1975/11/09 Sex: male Admission Date (Current Location): 12/06/2019  [facility] and [place] Number:  Producer, television/film/video and Address:  [facility], [address] [place], [place] [address]      Provider Number: [id number]  Attending Physician Name and Address:  Drema Dallas, MD  Relative Name and Phone Number:  Addison Bailey [phone]    Current Level of Care: Hospital Recommended Level of Care: Skilled Nursing Facility Prior Approval Number:    Date Approved/Denied:   PASRR Number: Pending  Discharge Plan: SNF    Current Diagnoses: Patient Active Problem List   Diagnosis Date Noted  . Seizure (HCC) 12/06/2019  . Schizophrenia (HCC) 09/08/2019  . Aggression 09/08/2019    Orientation RESPIRATION BLADDER Height & Weight     Self, Time, Situation, Place  Normal Continent Weight: 164 lb 3.9 oz (74.5 kg) Height:  6\' 2"  (188 cm)  BEHAVIORAL SYMPTOMS/MOOD NEUROLOGICAL BOWEL NUTRITION STATUS    Convulsions/Seizures Continent Diet (See DC summary)  AMBULATORY STATUS COMMUNICATION OF NEEDS Skin   Limited Assist Verbally                         Personal Care Assistance Level of Assistance  Bathing, Dressing, Feeding Bathing Assistance: Limited assistance Feeding assistance: Independent Dressing Assistance: Limited assistance     Functional Limitations Info  Sight, Hearing, Speech Sight Info: Adequate Hearing Info: Adequate Speech Info: Adequate    SPECIAL CARE FACTORS FREQUENCY  PT (By licensed PT), OT (By licensed OT)     PT Frequency: 5X per week OT Frequency: 5X per week            Contractures      Additional Factors Info  Psychotropic, Allergies, Code Status Code Status Info: FULL Allergies Info: NKA Psychotropic Info: Olazapine         Current Medications (12/11/2019):  This is the current hospital  active medication list Current Facility-Administered Medications  Medication Dose Route Frequency Provider Last Rate Last Admin  . acetaminophen (TYLENOL) tablet 650 mg  650 mg Oral Q4H PRN 13/11/2019 T, MD   650 mg at 12/09/19 1003   Or  . acetaminophen (TYLENOL) suppository 650 mg  650 mg Rectal Q4H PRN 13/08/21 T, MD      . benztropine mesylate (COGENTIN) injection 2 mg  2 mg Intravenous Daily Mikey College, MD   2 mg at 12/11/19 1116  . divalproex (DEPAKOTE) DR tablet 750 mg  750 mg Oral Q12H Bhagat, Srishti L, MD   750 mg at 12/11/19 1115  . enoxaparin (LOVENOX) injection 40 mg  40 mg Subcutaneous Q24H 13/10/21 T, MD   40 mg at 12/10/19 1645  . FLUoxetine (PROZAC) capsule 40 mg  40 mg Oral Daily 13/09/21 T, MD   40 mg at 12/11/19 1115  . LORazepam (ATIVAN) injection 1 mg  1 mg Intravenous Q6H PRN 13/10/21, PA-C      . multivitamin with minerals tablet 1 tablet  1 tablet Oral Daily Suzan Garibaldi, MD   1 tablet at 12/11/19 1115  . OLANZapine zydis (ZYPREXA) disintegrating tablet 10 mg  10 mg Oral PRN 13/10/21, MD   10 mg at 12/10/19 0940  . ondansetron (ZOFRAN) tablet 4 mg  4 mg Oral Q6H PRN 13/09/21, MD  Or  . ondansetron (ZOFRAN) injection 4 mg  4 mg Intravenous Q6H PRN Mikey College T, MD      . propranolol ER (INDERAL LA) 24 hr capsule 60 mg  60 mg Oral Daily Mikey College T, MD   60 mg at 12/11/19 1117     Discharge Medications: Please see discharge summary for a list of discharge medications.  Relevant Imaging Results:  Relevant Lab Results:   Additional Information SSN: [id number]  [place] Pink, Student-Social Work

## 2019-12-12 DIAGNOSIS — R569 Unspecified convulsions: Secondary | ICD-10-CM

## 2019-12-12 DIAGNOSIS — R45851 Suicidal ideations: Secondary | ICD-10-CM

## 2019-12-12 DIAGNOSIS — G9341 Metabolic encephalopathy: Secondary | ICD-10-CM | POA: Diagnosis present

## 2019-12-12 DIAGNOSIS — E860 Dehydration: Secondary | ICD-10-CM | POA: Diagnosis present

## 2019-12-12 LAB — COMPREHENSIVE METABOLIC PANEL
ALT: 35 U/L (ref 0–44)
AST: 34 U/L (ref 15–41)
Albumin: 3.3 g/dL — ABNORMAL LOW (ref 3.5–5.0)
Alkaline Phosphatase: 74 U/L (ref 38–126)
Anion gap: 11 (ref 5–15)
BUN: 14 mg/dL (ref 6–20)
CO2: 26 mmol/L (ref 22–32)
Calcium: 9.4 mg/dL (ref 8.9–10.3)
Chloride: 104 mmol/L (ref 98–111)
Creatinine, Ser: 1.21 mg/dL (ref 0.61–1.24)
GFR, Estimated: >60 mL/min (ref >60)
Glucose, Bld: 104 mg/dL — ABNORMAL HIGH (ref 70–99)
Potassium: 4.1 mmol/L (ref 3.5–5.1)
Sodium: 141 mmol/L (ref 135–145)
Total Bilirubin: 0.7 mg/dL (ref 0.3–1.2)
Total Protein: 7.1 g/dL (ref 6.5–8.1)

## 2019-12-12 LAB — CBC
HCT: 40.2 % (ref 39.0–52.0)
Hemoglobin: 12.6 g/dL — ABNORMAL LOW (ref 13.0–17.0)
MCH: 26.6 pg (ref 26.0–34.0)
MCHC: 31.3 g/dL (ref 30.0–36.0)
MCV: 84.8 fL (ref 80.0–100.0)
Platelets: 335 10*3/uL (ref 150–400)
RBC: 4.74 MIL/uL (ref 4.22–5.81)
RDW: 12.6 % (ref 11.5–15.5)
WBC: 6 10*3/uL (ref 4.0–10.5)
nRBC: 0 % (ref 0.0–0.2)

## 2019-12-12 LAB — VALPROIC ACID LEVEL: Valproic Acid Lvl: 68 ug/mL (ref 50.0–100.0)

## 2019-12-12 LAB — MAGNESIUM: Magnesium: 2.2 mg/dL (ref 1.7–2.4)

## 2019-12-12 LAB — PHOSPHORUS: Phosphorus: 4.1 mg/dL (ref 2.5–4.6)

## 2019-12-12 NOTE — Evaluation (Signed)
Physical Therapy Evaluation Patient Details Name: Brian Stafford MRN: [id number] DOB: 05-26-1975 Today's Date: 12/12/2019   History of Present Illness  Patient is a 44 y.o. male with PMH: schizophrenia, bipolar disorder, anxiety, intellectual disability, who presented to hospital from group home where he lives with new onset seizures which were witnessed by group home staff. UA negative for UTI, CT head negative for intracranial acute etiology. In hospital, he had decreased responsiveness and respirations after receiving Ativan but did not require intubation.   Clinical Impression   Pt admitted with above diagnosis. Pt demonstrates decreased mobility, strength, balance, and safety awareness and problem solving. Pt currently has a sitter in his hospital room.  He ambulated 300' without AD but had poor dynamic balance with DGI score < 19 indicating fall risk.  He does demonstrate improved mobility since initial evaluation; however, from PT perspective pt is not safe to return to group home where he does not have support for at least 8 hr per day.  At this time pt would need 24 hr support either at SNF, increased support at group home, or ALF.  Pt currently with functional limitations due to the deficits listed below (see PT Problem List). Pt will benefit from skilled PT to increase their independence and safety with mobility to allow discharge to the venue listed below.      Follow Up Recommendations Supervision/Assistance - 24 hour;Other (comment);SNF (does not have safety awareness to return to group home without support)    Equipment Recommendations  None recommended by PT    Recommendations for Other Services OT consult     Precautions / Restrictions Precautions Precautions: Fall      Mobility  Bed Mobility Overal bed mobility: Modified Independent Bed Mobility: Supine to Sit;Sit to Supine     Supine to sit: Modified independent (Device/Increase time) Sit to supine: Modified  independent (Device/Increase time)   General bed mobility comments: Cues for initiation    Transfers Overall transfer level: Needs assistance   Transfers: Sit to/from Stand Sit to Stand: Supervision         General transfer comment: mild unsteady  Ambulation/Gait Ambulation/Gait assistance: Min guard;Supervision Gait Distance (Feet): 300 Feet Assistive device: None;Straight cane Gait Pattern/deviations: Step-through pattern;Scissoring Gait velocity: decreased   General Gait Details: Tried to encourage pt to use cane but he did not want to and just held off ground. He had 5-6 episodes of scissoring and tended to drift L but no overt LOB.  Stairs Stairs: Yes Stairs assistance: Min guard Stair Management: One rail Right;Alternating pattern;Forwards Number of Stairs: 8    Wheelchair Mobility    Modified Rankin (Stroke Patients Only)       Balance Overall balance assessment: Needs assistance Sitting-balance support: No upper extremity supported Sitting balance-Leahy Scale: Good     Standing balance support: No upper extremity supported Standing balance-Leahy Scale: Fair Standing balance comment: ambulated without device and performed ADLs without device; however, could not accept moderate challenges of shifting outside BOS                 Standardized Balance Assessment Standardized Balance Assessment : Dynamic Gait Index   Dynamic Gait Index Level Surface: Mild Impairment Change in Gait Speed: Mild Impairment Gait with Horizontal Head Turns: Mild Impairment Gait with Vertical Head Turns: Mild Impairment Gait and Pivot Turn: Mild Impairment Step Over Obstacle: Moderate Impairment Step Around Obstacles: Moderate Impairment Steps: Mild Impairment Total Score: 14       Pertinent Vitals/Pain Pain Assessment:  No/denies pain    Home Living Family/patient expects to be discharged to:: Group home Living Arrangements: Group Home                     Prior Function Level of Independence: Independent         Comments: Independent in group home environment as far as ADL's.  No one to discuss iADL's     Hand Dominance        Extremity/Trunk Assessment   Upper Extremity Assessment Upper Extremity Assessment: Overall WFL for tasks assessed    Lower Extremity Assessment Lower Extremity Assessment: Overall WFL for tasks assessed    Cervical / Trunk Assessment Cervical / Trunk Assessment: Normal  Communication   Communication: No difficulties  Cognition Arousal/Alertness: Awake/alert Behavior During Therapy: Flat affect Overall Cognitive Status: No family/caregiver present to determine baseline cognitive functioning                                 General Comments: History of cognitive impairments but no one present to determine baseline.  Pt with limited verbalization with therapy, poor problem solving, requiring increased cues for safety.      General Comments      Exercises     Assessment/Plan    PT Assessment Patient needs continued PT services  PT Problem List Decreased activity tolerance;Decreased balance;Decreased mobility;Decreased safety awareness;Decreased cognition;Decreased knowledge of use of DME       PT Treatment Interventions DME instruction;Therapeutic activities;Gait training;Therapeutic exercise;Patient/family education;Stair training;Balance training;Functional mobility training;Cognitive remediation    PT Goals (Current goals can be found in the Care Plan section)  Acute Rehab PT Goals Patient Stated Goal: unable to state PT Goal Formulation: Patient unable to participate in goal setting Time For Goal Achievement: 12/26/19 Potential to Achieve Goals: Good Additional Goals Additional Goal #1: DGI score of >19 to indicate low fall risk    Frequency Min 3X/week   Barriers to discharge Decreased caregiver support Decreased caregiver support and decreased safety     Co-evaluation               AM-PAC PT "6 Clicks" Mobility  Outcome Measure Help needed turning from your back to your side while in a flat bed without using bedrails?: None Help needed moving from lying on your back to sitting on the side of a flat bed without using bedrails?: None Help needed moving to and from a bed to a chair (including a wheelchair)?: None Help needed standing up from a chair using your arms (e.g., wheelchair or bedside chair)?: None Help needed to walk in hospital room?: A Little Help needed climbing 3-5 steps with a railing? : A Little 6 Click Score: 22    End of Session Equipment Utilized During Treatment: Gait belt Activity Tolerance: Patient tolerated treatment well Patient left: with call bell/phone within reach;with nursing/sitter in room;in bed Nurse Communication: Mobility status PT Visit Diagnosis: Unsteadiness on feet (R26.81);Other symptoms and signs involving the nervous system (R29.898)    Time: 1220-1240 PT Time Calculation (min) (ACUTE ONLY): 20 min   Charges:   PT Evaluation $PT Re-evaluation: 1 Adelfa Koh, PT Acute Rehab Services Pager [phone] [facility] [phone]    Rayetta Humphrey 12/12/2019, 1:24 PM

## 2019-12-12 NOTE — Progress Notes (Signed)
PROGRESS NOTE    JAVAR ESHBACH  GNF:[id number] DOB: [birth year] DOA: 12/06/2019 PCP: Fleet Contras, MD     Brief Narrative:  Owens Hara Downingis a 44 y.o.BM PMHx schizophrenia, bipolar disorder, mild electro disability disorder, anxiety,   Presented with new onset of seizures. Patient remained confused probably from seizure and postictal state, most history obtained from ED staff and review of patient's chart. Patient started to have visual and auditory hallucination"seeing and hearing from demons"3 days ago and came to ED for evaluation.Patient moved to live in a couple 3 to 4 months ago, and has had since experiencing twitching like movement of lips and tongue as well as uncontrolled movements of his legs. 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 Patient was sent back to group home and starting psychiatric medications. However, it appears that the patient continues to have bizarre movement and hallucinations, group homecalledEMS this morning. EMS arrived and found the patient had seizure with whole body shaking for about 2 minutes.Patient had another witnessed seizure in the ED witnessed by the nurse, with brief episode of hypoxia oxygenation dropped to 70s. Whole episode lasted about a minute. CT head negative for intracranial acute etiology. Blood work, WBC 10, UDS negative, UA negative for UTI.  12/07/2019 rapid response overnight soon after admission - obtunded after ativan - no improvement with narcan or flumazanil but he is protecting airway - no intubation indicated. 12/08/2019 -much more awake alert oriented this morning appears to be back to baseline, concern for polypharmacy as such psychiatry was requested to follow along for medication management  recommending decrease dose of clozapine 12/09/2019 -continues to tolerate new medication doses and therapy, likely disposition the next 24 to 48 hours pending clinical course, tolerance of new medications, lack of new seizure activity and safe disposition location, likely back to group home. 12/10/19 -set up for discharge back to group home, unfortunately PT recommended 24-hour care which group home cannot provide thus patient is currently being evaluated for SNF placement, case management working diligently on safe location for disposition.  Patient clinically and medically stable for discharge at this time awaiting safe disposition.   Subjective: 11/11 afebrile overnight A/O x4, states has no intention of harming himself or anyone else.   Assessment & Plan: Covid vaccination;   Active Problems:   Seizure (HCC)   New onset seizure (HCC)   Acute metabolic encephalopathy   Suicidal ideation   Dehydration   New onset seizure, unclear etiology, POA -Keppra loading completed in the ED -Questionably in the setting of high-dose clozapine per discussion with psych/pharmacy given suspected noncompliance with clozapine (off for 3 months then resumed high dose) -clozapine has been discontinued at this time with appropriate improvement in symptoms -Neurology recommend continue Depakote 750 twice daily -CT head negative  Acute encephalopathy, rule out metabolic versus toxic etiology,  -Resolved -Likely postictal given above, concern for polypharmacy however patient did not respond to flumazenil or Narcan -Continue supportive care, imaging negative thus far -11/11 A/O x4 (does not know why), per psychiatry note from 11/10 this is patient's baseline.  Patient not threatening to harm himself or anyone else today  Involuntary body movement, improving -Chorea vsextrapyramidal syndrome - improving with reduction of medications as outlined above -Unclear if patient was taking his  medications at  previous facility, psychiatry involved, recommending decrease dose of clozapine given its known provocation of seizures -Olanzapine changed to as needed, clozapine discontinued -11/10 resolved  Dehydration, poor p.o. intake with lactic acidosis -Heart healthy diet -11/11 lactic acidosis resolved -Encourage p.o. fluid intake  Schizophrenia -Psychiatry on board, on antipsychiatric medications.  Seen by psychiatry on 11/7 -Benztropine IV 2 mg daily -Fluoxetine 40 mg daily -Zyprexa 10 mg daily  Suicidal ideation  -Told his RN earlier this morning that he wanted to kill himself, however when I saw patient stated he did not want to harm himself or anyone else. -11/10 consult to psychiatry for help with management; this is part of his schizophrenia vs medication -11/11 patient at baseline does not want to harm himself or anyone else.  Goals of care -11/11 PT consult;Evaluate patient for return to group home without additional PT needs   DVT prophylaxis: Lovenox Code Status: Full Family Communication:  Status is: Inpatient    Dispo: The patient is from: Group home              Anticipated d/c is to: TBD              Anticipated d/c date is:??  Per NCM group home cannot take patient back secondary to medical needs.              Patient currently stable      Consultants:  Neurology Psychiatry   Procedures/Significant Events:    I have personally reviewed and interpreted all radiology studies and my findings are as above.  VENTILATOR SETTINGS: Room air 11/11 SPO2; 100%   Cultures   Antimicrobials: Anti-infectives (From admission, onward)   None       Devices    LINES / TUBES:      Continuous Infusions:   Objective: Vitals:   12/12/19 0412 12/12/19 0752 12/12/19 0900 12/12/19 1504  BP: 101/75  125/85 117/77  Pulse: 63  87 90  Resp: 14  18   Temp: 97.9 F (36.6 C)  98.3 F (36.8 C) (!) 97.4 F (36.3 C)  TempSrc: Oral  Oral Oral  SpO2: 100%  100% 100% 100%  Weight:      Height:        Intake/Output Summary (Last 24 hours) at 12/12/2019 1824 Last data filed at 12/12/2019 1731 Gross per 24 hour  Intake 952 ml  Output --  Net 952 ml   Filed Weights   12/06/19 2052  Weight: 74.5 kg    Physical Exam:  General: A/O x4, No acute respiratory distress Eyes: negative scleral hemorrhage, negative anisocoria, negative icterus ENT: Negative Runny nose, negative gingival bleeding, Neck:  Negative scars, masses, torticollis, lymphadenopathy, JVD Lungs: Clear to auscultation bilaterally without wheezes or crackles Cardiovascular: Regular rate and rhythm without murmur gallop or rub normal S1 and S2 Abdomen: negative abdominal pain, nondistended, positive soft, bowel sounds, no rebound, no ascites, no appreciable mass Extremities: No significant cyanosis, clubbing, or edema bilateral lower extremities Skin: Negative rashes, lesions, ulcers Psychiatric:  Negative depression, negative anxiety, negative fatigue, negative mania  Central nervous system:  Cranial nerves II through XII intact, tongue/uvula midline, all extremities muscle strength 5/5, sensation intact throughout, negative dysarthria, negative expressive aphasia, negative receptive aphasia.  .     Data Reviewed: Care during the described time interval was provided by me .  I have reviewed this patient's available data, including medical history, events of note, physical examination, and all test results as part of my  evaluation.  CBC: Recent Labs  Lab 12/06/19 0910 12/07/19 0255 12/07/19 0505 12/07/19 0505 12/08/19 0349 12/09/19 0808 12/10/19 0200 12/11/19 0127 12/12/19 0232  WBC 10.0  --  4.3   < > 7.7 6.1 5.6 4.8 6.0  NEUTROABS 8.5*  --  3.1  --   --   --   --   --   --   HGB 11.8*   < > 10.5*   < > 11.5* 11.5* 11.8* 11.3* 12.6*  HCT 36.9*   < > 33.8*   < > 36.1* 36.3* 37.3* 35.0* 40.2  MCV 84.6  --  87.8   < > 86.0 85.8 86.3 84.5 84.8  PLT 322  --  244    < > 251 273 275 304 335   < > = values in this interval not displayed.   Basic Metabolic Panel: Recent Labs  Lab 12/07/19 0505 12/07/19 0505 12/08/19 0349 12/09/19 0808 12/10/19 0200 12/11/19 0127 12/12/19 0232  NA 142   < > 142 142 141 140 141  K 3.4*   < > 3.4* 3.4* 3.8 3.7 4.1  CL 107   < > 110 104 106 105 104  CO2 22   < > 20* 30 26 28 26   GLUCOSE 84   < > 78 104* 122* 107* 104*  BUN 20   < > 12 7 12 13 14   CREATININE 1.16   < > 1.15 0.94 0.96 1.00 1.21  CALCIUM 8.2*   < > 8.8* 9.0 9.0 9.0 9.4  MG 2.6*  --  2.2  --   --   --  2.2  PHOS  --   --  2.1*  --   --   --  4.1   < > = values in this interval not displayed.   GFR: Estimated Creatinine Clearance: 83.8 mL/min (by C-G formula based on SCr of 1.21 mg/dL). Liver Function Tests: Recent Labs  Lab 12/08/19 0349 12/09/19 0808 12/10/19 0200 12/11/19 0127 12/12/19 0232  AST 34 34 33 27 34  ALT 21 21 24 24  35  ALKPHOS 72 60 64 57 74  BILITOT 0.9 0.5 0.4 0.6 0.7  PROT 6.3* 6.6 6.3* 6.2* 7.1  ALBUMIN 3.0* 3.1* 2.9* 2.8* 3.3*   No results for input(s): LIPASE, AMYLASE in the last 168 hours. No results for input(s): AMMONIA in the last 168 hours. Coagulation Profile: No results for input(s): INR, PROTIME in the last 168 hours. Cardiac Enzymes: Recent Labs  Lab 12/06/19 2216 12/07/19 0505  CKTOTAL 1,084* 848*   BNP (last 3 results) No results for input(s): PROBNP in the last 8760 hours. HbA1C: No results for input(s): HGBA1C in the last 72 hours. CBG: No results for input(s): GLUCAP in the last 168 hours. Lipid Profile: No results for input(s): CHOL, HDL, LDLCALC, TRIG, CHOLHDL, LDLDIRECT in the last 72 hours. Thyroid Function Tests: No results for input(s): TSH, T4TOTAL, FREET4, T3FREE, THYROIDAB in the last 72 hours. Anemia Panel: No results for input(s): VITAMINB12, FOLATE, FERRITIN, TIBC, IRON, RETICCTPCT in the last 72 hours. Sepsis Labs: Recent Labs  Lab 12/06/19 0910 12/06/19 1150 12/06/19 2218  12/07/19 0506 12/08/19 0349  PROCALCITON  --   --   --   --  <0.10  LATICACIDVEN 2.0* 1.4 1.3 0.8  --     Recent Results (from the past 240 hour(s))  Respiratory Panel by RT PCR (Flu A&B, Covid) - Nasopharyngeal Swab     Status: None   Collection Time: 12/03/19  2:30 PM   Specimen: Nasopharyngeal Swab  Result Value Ref Range Status   SARS Coronavirus 2 by RT PCR NEGATIVE NEGATIVE Final    Comment: (NOTE) SARS-CoV-2 target nucleic acids are NOT DETECTED.  The SARS-CoV-2 RNA is generally detectable in upper respiratoy specimens during the acute phase of infection. The lowest concentration of SARS-CoV-2 viral copies this assay can detect is 131 copies/mL. A negative result does not preclude SARS-Cov-2 infection and should not be used as the sole basis for treatment or other patient management decisions. A negative result may occur with  improper specimen collection/handling, submission of specimen other than nasopharyngeal swab, presence of viral mutation(s) within the areas targeted by this assay, and inadequate number of viral copies (<131 copies/mL). A negative result must be combined with clinical observations, patient history, and epidemiological information. The expected result is Negative.  Fact Sheet for Patients:  https://www.moore.com/  Fact Sheet for Healthcare Providers:  https://www.young.biz/  This test is no t yet approved or cleared by the [place] FDA and  has been authorized for detection and/or diagnosis of SARS-CoV-2 by FDA under an Emergency Use Authorization (EUA). This EUA will remain  in effect (meaning this test can be used) for the duration of the COVID-19 declaration under Section 564(b)(1) of the Act, 21 U.S.C. section 360bbb-3(b)(1), unless the authorization is terminated or revoked sooner.     Influenza A by PCR NEGATIVE NEGATIVE Final   Influenza B by PCR NEGATIVE NEGATIVE Final    Comment:  (NOTE) The Xpert Xpress SARS-CoV-2/FLU/RSV assay is intended as an aid in  the diagnosis of influenza from Nasopharyngeal swab specimens and  should not be used as a sole basis for treatment. Nasal washings and  aspirates are unacceptable for Xpert Xpress SARS-CoV-2/FLU/RSV  testing.  Fact Sheet for Patients: https://www.moore.com/  Fact Sheet for Healthcare Providers: https://www.young.biz/  This test is not yet approved or cleared by the [place] FDA and  has been authorized for detection and/or diagnosis of SARS-CoV-2 by  FDA under an Emergency Use Authorization (EUA). This EUA will remain  in effect (meaning this test can be used) for the duration of the  Covid-19 declaration under Section 564(b)(1) of the Act, 21  U.S.C. section 360bbb-3(b)(1), unless the authorization is  terminated or revoked. Performed at [facility], [address] [place], [place] [address]          Radiology Studies: No results found.      Scheduled Meds: . benztropine mesylate  2 mg Intravenous Daily  . divalproex  750 mg Oral Q12H  . enoxaparin (LOVENOX) injection  40 mg Subcutaneous Q24H  . feeding supplement  237 mL Oral BID BM  . FLUoxetine  40 mg Oral Daily  . multivitamin with minerals  1 tablet Oral Daily  . OLANZapine zydis  10 mg Oral QHS  . propranolol ER  60 mg Oral Daily   Continuous Infusions:   LOS: 6 days    Time spent:40 min    Arless Vineyard, Roselind Messier, MD [facility] Pager [phone]  If 7PM-7AM, please contact night-coverage [email or web address] Password [facility] 12/12/2019, 6:24 PM

## 2019-12-13 LAB — COMPREHENSIVE METABOLIC PANEL
ALT: 32 U/L (ref 0–44)
AST: 26 U/L (ref 15–41)
Albumin: 3.1 g/dL — ABNORMAL LOW (ref 3.5–5.0)
Alkaline Phosphatase: 75 U/L (ref 38–126)
Anion gap: 9 (ref 5–15)
BUN: 17 mg/dL (ref 6–20)
CO2: 27 mmol/L (ref 22–32)
Calcium: 8.9 mg/dL (ref 8.9–10.3)
Chloride: 104 mmol/L (ref 98–111)
Creatinine, Ser: 1.18 mg/dL (ref 0.61–1.24)
GFR, Estimated: >60 mL/min (ref >60)
Glucose, Bld: 105 mg/dL — ABNORMAL HIGH (ref 70–99)
Potassium: 4.2 mmol/L (ref 3.5–5.1)
Sodium: 140 mmol/L (ref 135–145)
Total Bilirubin: 0.6 mg/dL (ref 0.3–1.2)
Total Protein: 6.7 g/dL (ref 6.5–8.1)

## 2019-12-13 LAB — CBC
HCT: 37.4 % — ABNORMAL LOW (ref 39.0–52.0)
Hemoglobin: 11.7 g/dL — ABNORMAL LOW (ref 13.0–17.0)
MCH: 26.7 pg (ref 26.0–34.0)
MCHC: 31.3 g/dL (ref 30.0–36.0)
MCV: 85.2 fL (ref 80.0–100.0)
Platelets: 279 10*3/uL (ref 150–400)
RBC: 4.39 MIL/uL (ref 4.22–5.81)
RDW: 12.8 % (ref 11.5–15.5)
WBC: 5.2 10*3/uL (ref 4.0–10.5)
nRBC: 0 % (ref 0.0–0.2)

## 2019-12-13 LAB — MAGNESIUM: Magnesium: 2.1 mg/dL (ref 1.7–2.4)

## 2019-12-13 LAB — PHOSPHORUS: Phosphorus: 4.1 mg/dL (ref 2.5–4.6)

## 2019-12-13 NOTE — TOC Progression Note (Signed)
Transition of Care [facility]) - Progression Note    Patient Details  Name: Brian Stafford MRN: [id number] Date of Birth: Jun 23, 1975  Transition of [facility]) CM/SW Contact  Eduard Roux, [place] Phone Number: 12/13/2019, 4:38 PM  Clinical Narrative:     CSW spoke with Margarita Rana # [phone]- she informed  During daytime hours patient attends [facility] Monday-Friday, then he has a  one to one sitter from 3pm-7am Tsosie Billing # [phone]). She states Group does not have the staff to provide 24/7 supervision.   CSW was advised patient could not attend daycenter because he needs assistance with mobility. CSW was advised to contact Director of [employer], [place] # [phone]. RNCM called and was informed by [name], daycenter only provides supervision,unable to physically assist patient.    Case was staffed with [facility] Director- CSW explained the Group Home was reluctant to take the patient back if the patient needs 24/7 supervision. TOC Director advised patient not appropriate for SNF as it is a cognitive issue and to discharge patient back to group home if medically stable.   CSW called [name] w/ Group Home, left voice message to anticipate patient will discharge back to group home. - he is medically stable - case was staffed with [facility] Director and agrees it is the responsibility of Group Home to provide supervision.   CSW will continue to follow and assist with discharge planning   Antony Blackbird, MSW, LCSWA Clinical Social Worker   Expected Discharge Plan: Group Home Barriers to Discharge: No Barriers Identified  Expected Discharge Plan and Services Expected Discharge Plan: Group Home   Discharge Planning Services: CM Consult, Medication Assistance   Living arrangements for the past 2 months: Group Home Expected Discharge Date: 12/10/19                                     Social Determinants of Health (SDOH) Interventions    Readmission Risk  Interventions Readmission Risk Prevention Plan 12/10/2019  Transportation Screening Complete  PCP or Specialist Appt within 5-7 Days Complete  Home Care Screening Complete  Medication Review (RN CM) Complete  Some recent data might be hidden

## 2019-12-13 NOTE — Progress Notes (Addendum)
PROGRESS NOTE    Brian Stafford  RKY:[id number] DOB: 09/29/1975 DOA: 12/06/2019 PCP: Fleet Contras, MD     Brief Narrative:  Brian Siedschlag Downingis a 44 y.o.BM PMHx schizophrenia, bipolar disorder, mild electro disability disorder, anxiety,   Presented with new onset of seizures. Patient remained confused probably from seizure and postictal state, most history obtained from ED staff and review of patient's chart. Patient started to have visual and auditory hallucination"seeing and hearing from demons"3 days ago and came to ED for evaluation.Patient moved to live in a couple 3 to 4 months ago, and has had since experiencing twitching like movement of lips and tongue as well as uncontrolled movements of his legs. 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 Patient was sent back to group home and starting psychiatric medications. However, it appears that the patient continues to have bizarre movement and hallucinations, group homecalled[facility] this morning. EMS arrived and found the patient had seizure with whole body shaking for about 2 minutes.Patient had another witnessed seizure in the ED witnessed by the nurse, with brief episode of hypoxia oxygenation dropped to 70s. Whole episode lasted about a minute. CT head negative for intracranial acute etiology. Blood work, WBC 10, UDS negative, UA negative for UTI.  12/07/2019 rapid response overnight soon after admission - obtunded after ativan - no improvement with narcan or flumazanil but he is protecting airway - no intubation indicated. 12/08/2019 -much more awake alert oriented this morning appears to be back to baseline, concern for polypharmacy as such psychiatry was requested to follow along for medication management  recommending decrease dose of clozapine 12/09/2019 -continues to tolerate new medication doses and therapy, likely disposition the next 24 to 48 hours pending clinical course, tolerance of new medications, lack of new seizure activity and safe disposition location, likely back to group home. 12/10/19 -set up for discharge back to group home, unfortunately PT recommended 24-hour care which group home cannot provide thus patient is currently being evaluated for SNF placement, case management working diligently on safe location for disposition.  Patient clinically and medically stable for discharge at this time awaiting safe disposition.   Subjective: 11/12 afebrile overnight A/O x4, no complaints.  Patient very cooperative sitting comfortably in bed eating spaghetti and meatballs.  When I inquired as to the quality of the spaghetti and meatballs stated was lacking in taste.  I stated to patient he was not going to get mom's home cooking and this elicited an appropriate laugh from the patient.   Assessment & Plan: Covid vaccination;   Active Problems:   Seizure (HCC)   New onset seizure (HCC)   Acute metabolic encephalopathy   Suicidal ideation   Dehydration   New onset seizure, unclear etiology, POA -Keppra loading completed in the ED -Questionably in the setting of high-dose clozapine per discussion with psych/pharmacy given suspected noncompliance with clozapine (off for 3 months then resumed high dose) -clozapine has been discontinued at this time with appropriate improvement in symptoms -Neurology recommend continue Depakote 750 twice daily -CT head negative  Acute encephalopathy, rule out metabolic versus toxic etiology,  -Resolved -Likely postictal given above, concern for polypharmacy however patient did not respond to flumazenil or Narcan -Continue supportive care, imaging negative thus far -11/11 A/O x4  per  psychiatry note from 11/10 this is patient's baseline.  Patient not  threatening to harm himself or anyone else today  Involuntary body movement, improving -Chorea vsextrapyramidal syndrome - improving with reduction of medications as outlined above -Unclear if patient was taking his medications at previous facility, psychiatry involved, recommending decrease dose of clozapine given its known provocation of seizures -Olanzapine changed to as needed, clozapine discontinued -11/10 resolved  Dehydration, poor p.o. intake with lactic acidosis -Heart healthy diet -11/11 lactic acidosis resolved -Encourage p.o. fluid intake  Schizophrenia -Psychiatry on board, on antipsychiatric medications.  Seen by psychiatry on 11/7 -Benztropine IV 2 mg daily -Fluoxetine 40 mg daily -Zyprexa 10 mg daily  Suicidal ideation  -Told his RN earlier this morning that he wanted to kill himself, however when I saw patient stated he did not want to harm himself or anyone else. -11/10 consult to psychiatry for help with management; this is part of his schizophrenia vs medication -11/11 patient at baseline does not want to harm himself or anyone else.  Goals of care -11/11 PT consult;Evaluate patient for return to group home without additional PT needs   DVT prophylaxis: Lovenox Code Status: Full Family Communication:  Status is: Inpatient    Dispo: The patient is from: Group home              Anticipated d/c is to: TBD              Anticipated d/c date is:??  Per NCM group home cannot take patient back secondary to medical needs.              Patient currently stable      Consultants:  Neurology Psychiatry   Procedures/Significant Events:    I have personally reviewed and interpreted all radiology studies and my findings are as above.  VENTILATOR SETTINGS: Room air 11/12 SPO2; 100%   Cultures   Antimicrobials: Anti-infectives (From admission, onward)   None       Devices    LINES / TUBES:      Continuous  Infusions:   Objective: Vitals:   12/13/19 0200 12/13/19 0400 12/13/19 0740 12/13/19 0800  BP:  102/67  116/68  Pulse: 94 78 90 86  Resp: (!) 22 (!) 25 (!) 26 (!) 22  Temp:    98.7 F (37.1 C)  TempSrc:    Oral  SpO2: 100% 98% 100% 100%  Weight:      Height:        Intake/Output Summary (Last 24 hours) at 12/13/2019 0843 Last data filed at 12/12/2019 1731 Gross per 24 hour  Intake 712 ml  Output --  Net 712 ml   Filed Weights   12/06/19 2052  Weight: 74.5 kg    Physical Exam:  General: A/O x4, No acute respiratory distress Eyes: negative scleral hemorrhage, negative anisocoria, negative icterus ENT: Negative Runny nose, negative gingival bleeding, Neck:  Negative scars, masses, torticollis, lymphadenopathy, JVD Lungs: Clear to auscultation bilaterally without wheezes or crackles Cardiovascular: Regular rate and rhythm without murmur gallop or rub normal S1 and S2 Abdomen: negative abdominal pain, nondistended, positive soft, bowel sounds, no rebound, no ascites, no appreciable mass Extremities: No significant cyanosis, clubbing, or edema bilateral lower extremities Skin: Negative rashes, lesions, ulcers Psychiatric:  Negative depression, negative anxiety, negative fatigue, negative mania  Central nervous system:  Cranial nerves II through XII intact, tongue/uvula midline, all extremities muscle strength 5/5, sensation intact throughout, negative dysarthria, negative expressive aphasia, negative receptive aphasia.  Marland Kitchen  Data Reviewed: Care during the described time interval was provided by me .  I have reviewed this patient's available data, including medical history, events of note, physical examination, and all test results as part of my evaluation.  CBC: Recent Labs  Lab 12/07/19 0505 12/08/19 0349 12/09/19 0808 12/10/19 0200 12/11/19 0127 12/12/19 0232 12/13/19 0325  WBC 4.3   < > 6.1 5.6 4.8 6.0 5.2  NEUTROABS 3.1  --   --   --   --   --   --   HGB  10.5*   < > 11.5* 11.8* 11.3* 12.6* 11.7*  HCT 33.8*   < > 36.3* 37.3* 35.0* 40.2 37.4*  MCV 87.8   < > 85.8 86.3 84.5 84.8 85.2  PLT 244   < > 273 275 304 335 279   < > = values in this interval not displayed.   Basic Metabolic Panel: Recent Labs  Lab 12/07/19 0505 12/07/19 0505 12/08/19 0349 12/08/19 0349 12/09/19 0808 12/10/19 0200 12/11/19 0127 12/12/19 0232 12/13/19 0325  NA 142   < > 142   < > 142 141 140 141 140  K 3.4*   < > 3.4*   < > 3.4* 3.8 3.7 4.1 4.2  CL 107   < > 110   < > 104 106 105 104 104  CO2 22   < > 20*   < > 30 26 28 26 27   GLUCOSE 84   < > 78   < > 104* 122* 107* 104* 105*  BUN 20   < > 12   < > 7 12 13 14 17   CREATININE 1.16   < > 1.15   < > 0.94 0.96 1.00 1.21 1.18  CALCIUM 8.2*   < > 8.8*   < > 9.0 9.0 9.0 9.4 8.9  MG 2.6*  --  2.2  --   --   --   --  2.2 2.1  PHOS  --   --  2.1*  --   --   --   --  4.1 4.1   < > = values in this interval not displayed.   GFR: Estimated Creatinine Clearance: 85.9 mL/min (by C-G formula based on SCr of 1.18 mg/dL). Liver Function Tests: Recent Labs  Lab 12/09/19 0808 12/10/19 0200 12/11/19 0127 12/12/19 0232 12/13/19 0325  AST 34 33 27 34 26  ALT 21 24 24  35 32  ALKPHOS 60 64 57 74 75  BILITOT 0.5 0.4 0.6 0.7 0.6  PROT 6.6 6.3* 6.2* 7.1 6.7  ALBUMIN 3.1* 2.9* 2.8* 3.3* 3.1*   No results for input(s): LIPASE, AMYLASE in the last 168 hours. No results for input(s): AMMONIA in the last 168 hours. Coagulation Profile: No results for input(s): INR, PROTIME in the last 168 hours. Cardiac Enzymes: Recent Labs  Lab 12/06/19 2216 12/07/19 0505  CKTOTAL 1,084* 848*   BNP (last 3 results) No results for input(s): PROBNP in the last 8760 hours. HbA1C: No results for input(s): HGBA1C in the last 72 hours. CBG: No results for input(s): GLUCAP in the last 168 hours. Lipid Profile: No results for input(s): CHOL, HDL, LDLCALC, TRIG, CHOLHDL, LDLDIRECT in the last 72 hours. Thyroid Function Tests: No results  for input(s): TSH, T4TOTAL, FREET4, T3FREE, THYROIDAB in the last 72 hours. Anemia Panel: No results for input(s): VITAMINB12, FOLATE, FERRITIN, TIBC, IRON, RETICCTPCT in the last 72 hours. Sepsis Labs: Recent Labs  Lab 12/06/19 1150 12/06/19 2218 12/07/19 0506 12/08/19 0349  PROCALCITON  --   --   --  <0.10  LATICACIDVEN 1.4 1.3 0.8  --     Recent Results (from the past 240 hour(s))  Respiratory Panel by RT PCR (Flu A&B, Covid) - Nasopharyngeal Swab     Status: None   Collection Time: 12/03/19  2:30 PM   Specimen: Nasopharyngeal Swab  Result Value Ref Range Status   SARS Coronavirus 2 by RT PCR NEGATIVE NEGATIVE Final    Comment: (NOTE) SARS-CoV-2 target nucleic acids are NOT DETECTED.  The SARS-CoV-2 RNA is generally detectable in upper respiratoy specimens during the acute phase of infection. The lowest concentration of SARS-CoV-2 viral copies this assay can detect is 131 copies/mL. A negative result does not preclude SARS-Cov-2 infection and should not be used as the sole basis for treatment or other patient management decisions. A negative result may occur with  improper specimen collection/handling, submission of specimen other than nasopharyngeal swab, presence of viral mutation(s) within the areas targeted by this assay, and inadequate number of viral copies (<131 copies/mL). A negative result must be combined with clinical observations, patient history, and epidemiological information. The expected result is Negative.  Fact Sheet for Patients:  https://www.moore.com/https://www.fda.gov/media/142436/download  Fact Sheet for Healthcare Providers:  https://www.young.biz/https://www.fda.gov/media/142435/download  This test is no t yet approved or cleared by the [place] FDA and  has been authorized for detection and/or diagnosis of SARS-CoV-2 by FDA under an Emergency Use Authorization (EUA). This EUA will remain  in effect (meaning this test can be used) for the duration of the COVID-19 declaration  under Section 564(b)(1) of the Act, 21 U.S.C. section 360bbb-3(b)(1), unless the authorization is terminated or revoked sooner.     Influenza A by PCR NEGATIVE NEGATIVE Final   Influenza B by PCR NEGATIVE NEGATIVE Final    Comment: (NOTE) The Xpert Xpress SARS-CoV-2/FLU/RSV assay is intended as an aid in  the diagnosis of influenza from Nasopharyngeal swab specimens and  should not be used as a sole basis for treatment. Nasal washings and  aspirates are unacceptable for Xpert Xpress SARS-CoV-2/FLU/RSV  testing.  Fact Sheet for Patients: https://www.moore.com/https://www.fda.gov/media/142436/download  Fact Sheet for Healthcare Providers: https://www.young.biz/https://www.fda.gov/media/142435/download  This test is not yet approved or cleared by the [place] FDA and  has been authorized for detection and/or diagnosis of SARS-CoV-2 by  FDA under an Emergency Use Authorization (EUA). This EUA will remain  in effect (meaning this test can be used) for the duration of the  Covid-19 declaration under Section 564(b)(1) of the Act, 21  U.S.C. section 360bbb-3(b)(1), unless the authorization is  terminated or revoked. Performed at [facility], [address] [place], [place] [address]          Radiology Studies: No results found.      Scheduled Meds: . benztropine mesylate  2 mg Intravenous Daily  . divalproex  750 mg Oral Q12H  . enoxaparin (LOVENOX) injection  40 mg Subcutaneous Q24H  . feeding supplement  237 mL Oral BID BM  . FLUoxetine  40 mg Oral Daily  . multivitamin with minerals  1 tablet Oral Daily  . OLANZapine zydis  10 mg Oral QHS  . propranolol ER  60 mg Oral Daily   Continuous Infusions:   LOS: 7 days    Time spent:40 min    Jessye Imhoff, Roselind MessierURTIS J, MD [facility] Pager [phone]  If 7PM-7AM, please contact night-coverage [email or web address] Password [facility] 12/13/2019, 8:43 AM

## 2019-12-13 NOTE — Progress Notes (Signed)
This nurse observed patient's ability to get out of bed and ambulate around room independently without assistance.

## 2019-12-13 NOTE — Evaluation (Signed)
Occupational Therapy Evaluation Patient Details Name: Brian Stafford MRN: [id number] DOB: Oct 18, 1975 Today's Date: 12/13/2019    History of Present Illness Patient is a 44 y.o. male with PMH: schizophrenia, bipolar disorder, anxiety, intellectual disability, who presented to hospital from group home where he lives with new onset seizures which were witnessed by group home staff. UA negative for UTI, CT head negative for intracranial acute etiology. In hospital, he had decreased responsiveness and respirations after receiving Ativan but did not require intubation.    Clinical Impression   This 43 yo male admitted with above presents to acute OT at a min guard-setup/S level for basic ADLs with decreased safety awareness and decreased balance. He will benefit from a short SNF stay to get back to his Mod I /Independent level for basic ADLs. We will continue to follow.    Follow Up Recommendations  No OT follow up;SNF;Supervision/Assistance - 24 hour    Equipment Recommendations  None recommended by OT       Precautions / Restrictions Precautions Precautions: Fall Restrictions Weight Bearing Restrictions: No      Mobility Bed Mobility Overal bed mobility: Modified Independent             General bed mobility comments: increased time due to low back pain    Transfers Overall transfer level: Needs assistance Equipment used: None Transfers: Sit to/from Stand Sit to Stand: Min guard         General transfer comment: mild unsteady    Balance Overall balance assessment: Needs assistance Sitting-balance support: No upper extremity supported;Feet supported Sitting balance-Leahy Scale: Good     Standing balance support: No upper extremity supported Standing balance-Leahy Scale: Fair Standing balance comment: standing at sink to wash hands (VCs to where to find soap)                           ADL either performed or assessed with clinical judgement   ADL  Overall ADL's : Needs assistance/impaired Eating/Feeding: Independent;Sitting   Grooming: Min guard;Wash/dry hands;Standing   Upper Body Bathing: Set up;Sitting   Lower Body Bathing: Min guard;Sit to/from stand   Upper Body Dressing : Set up;Sitting   Lower Body Dressing: Min guard;Sit to/from stand   Toilet Transfer: Min guard;Ambulation;Comfort height toilet   Toileting- Clothing Manipulation and Hygiene: Min guard;Sit to/from stand               Vision Patient Visual Report: No change from baseline              Pertinent Vitals/Pain Pain Assessment: Faces Faces Pain Scale: Hurts little more Pain Location: low back Pain Descriptors / Indicators: Discomfort;Grimacing;Sore Pain Intervention(s): Limited activity within patient's tolerance;Monitored during session;Repositioned;Heat applied;Patient requesting pain meds-RN notified     Hand Dominance Right   Extremity/Trunk Assessment Upper Extremity Assessment Upper Extremity Assessment: Overall WFL for tasks assessed           Communication Communication Communication: No difficulties   Cognition Arousal/Alertness: Awake/alert Behavior During Therapy: Flat affect Overall Cognitive Status: No family/caregiver present to determine baseline cognitive functioning                                 General Comments: History of cognitive impairments but no one present to determine baseline.              Home Living Family/patient expects to be discharged to::  Skilled nursing facility Living Arrangements: Group Home                                      Prior Functioning/Environment Level of Independence: Independent        Comments: Independent in group home environment as far as ADL's.  He report someone provides their meals for them and dispenses meds as well as that he goes to a "school" during the day (M-F)        OT Problem List: Impaired balance (sitting and/or  standing);Decreased cognition      OT Treatment/Interventions: Self-care/ADL training;DME and/or AE instruction;Patient/family education;Balance training    OT Goals(Current goals can be found in the care plan section) Acute Rehab OT Goals Patient Stated Goal: to lay back down when we finished due to low back pain OT Goal Formulation: With patient Time For Goal Achievement: 12/27/19 Potential to Achieve Goals: Good  OT Frequency: Min 2X/week   Barriers to D/C: Decreased caregiver support             AM-PAC OT "6 Clicks" Daily Activity     Outcome Measure Help from another person eating meals?: None Help from another person taking care of personal grooming?: A Little Help from another person toileting, which includes using toliet, bedpan, or urinal?: A Little Help from another person bathing (including washing, rinsing, drying)?: A Little Help from another person to put on and taking off regular upper body clothing?: A Little Help from another person to put on and taking off regular lower body clothing?: A Little 6 Click Score: 19   End of Session Equipment Utilized During Treatment: Gait belt Nurse Communication: Patient requests pain meds  Activity Tolerance: Patient tolerated treatment well Patient left: in bed;with call bell/phone within reach (sitter in room)  OT Visit Diagnosis: Unsteadiness on feet (R26.81);Other abnormalities of gait and mobility (R26.89);Other symptoms and signs involving cognitive function                Time: 1424-1445 OT Time Calculation (min): 21 min Charges:  OT General Charges $OT Visit: 1 Visit OT Evaluation $OT Eval Moderate Complexity: 1 Mod  Ignacia Palma, OTR/L Acute [employer] Pager [phone] Office [phone]     Evette Georges 12/13/2019, 3:52 PM

## 2019-12-14 LAB — COMPREHENSIVE METABOLIC PANEL
ALT: 34 U/L (ref 0–44)
AST: 30 U/L (ref 15–41)
Albumin: 3 g/dL — ABNORMAL LOW (ref 3.5–5.0)
Alkaline Phosphatase: 92 U/L (ref 38–126)
Anion gap: 9 (ref 5–15)
BUN: 16 mg/dL (ref 6–20)
CO2: 22 mmol/L (ref 22–32)
Calcium: 8.7 mg/dL — ABNORMAL LOW (ref 8.9–10.3)
Chloride: 104 mmol/L (ref 98–111)
Creatinine, Ser: 1.08 mg/dL (ref 0.61–1.24)
GFR, Estimated: >60 mL/min (ref >60)
Glucose, Bld: 100 mg/dL — ABNORMAL HIGH (ref 70–99)
Potassium: 3.9 mmol/L (ref 3.5–5.1)
Sodium: 135 mmol/L (ref 135–145)
Total Bilirubin: 0.5 mg/dL (ref 0.3–1.2)
Total Protein: 6.4 g/dL — ABNORMAL LOW (ref 6.5–8.1)

## 2019-12-14 LAB — PHOSPHORUS: Phosphorus: 2.8 mg/dL (ref 2.5–4.6)

## 2019-12-14 LAB — CBC
HCT: 35.4 % — ABNORMAL LOW (ref 39.0–52.0)
Hemoglobin: 11.5 g/dL — ABNORMAL LOW (ref 13.0–17.0)
MCH: 27.1 pg (ref 26.0–34.0)
MCHC: 32.5 g/dL (ref 30.0–36.0)
MCV: 83.3 fL (ref 80.0–100.0)
Platelets: 282 10*3/uL (ref 150–400)
RBC: 4.25 MIL/uL (ref 4.22–5.81)
RDW: 13.1 % (ref 11.5–15.5)
WBC: 3.9 10*3/uL — ABNORMAL LOW (ref 4.0–10.5)
nRBC: 0 % (ref 0.0–0.2)

## 2019-12-14 LAB — MAGNESIUM: Magnesium: 2.2 mg/dL (ref 1.7–2.4)

## 2019-12-14 MED ORDER — OLANZAPINE 10 MG PO TBDP
10.0000 mg | ORAL_TABLET | Freq: Every day | ORAL | 0 refills | Status: DC
Start: 2019-12-14 — End: 2020-03-24

## 2019-12-14 MED ORDER — DIVALPROEX SODIUM 250 MG PO DR TAB: 750.0000 mg | DELAYED_RELEASE_TABLET | Freq: Two times a day (BID) | ORAL | 0 refills | Status: DC

## 2019-12-14 MED ORDER — PROPRANOLOL HCL ER 60 MG PO CP24: 60.0000 mg | ORAL_CAPSULE | Freq: Every day | ORAL | 0 refills | Status: AC

## 2019-12-14 NOTE — Discharge Summary (Signed)
Physician Discharge Summary  Brian Stafford LSL:[id number] DOB: 12-24-1975 DOA: 12/06/2019  PCP: Fleet Contras, MD  Admit date: 12/06/2019 Discharge date: 12/14/2019  Time spent: 35 minutes  Recommendations for Outpatient Follow-up:   New onset seizure, unclear etiology, POA -Keppra loading completed in the ED -Questionably in the setting of high-dose clozapine per discussion with psych/pharmacy given suspected noncompliance with clozapine (off for 3 months then resumed high dose)  -clozapine has been discontinued at this time with appropriate improvement in symptoms -Neurology recommend continue Depakote 750 twice daily -CT head negative  Acute encephalopathy, rule out metabolic versus toxic etiology,  -Resolved -Likely postictal given above, concern for polypharmacy however patient did not respond to flumazenil or Narcan -11/11 A/O x4  per psychiatry note from 11/10 this is patient's baseline.  Patient not threatening to harm himself or anyone else today  Involuntary body movement, improving -Chorea vsextrapyramidal syndrome- improving with reduction of medications as outlined above -Unclear if patient was taking his medications at previous facility, psychiatry involved, recommending decrease dose of clozapine given its known provocation of seizures -Olanzapine changed to as needed, clozapine discontinued -11/10 resolved  Dehydration, poor p.o. intake with lactic acidosis -Heart healthy diet -11/11 lactic acidosis resolved  Schizophrenia -Psychiatry on board, on antipsychiatric medications.  Seen by psychiatry on 11/7 -Benztropine IV 2 mg daily -Fluoxetine 40 mg daily -Zyprexa 10 mg daily  Suicidal ideation  -11/10 told his RN earlier this morning that he wanted to kill himself, however when I saw patient stated he did not want to harm himself or anyone else. -11/10 consult to psychiatry for help with management; this is part of his schizophrenia vs  medication -11/11 patient at baseline does not want to harm himself or anyone else. -11/13 Pleasant, cooperative, does not want to harm himself or anyone else stable for discharge   Discharge Diagnoses:  Active Problems:   Seizure (HCC)   New onset seizure (HCC)   Acute metabolic encephalopathy   Suicidal ideation   Dehydration   Discharge Condition: Stable  Diet recommendation: Regular  Filed Weights   12/06/19 2052 12/13/19 2100  Weight: 74.5 kg 73.1 kg    History of present illness:  Brian Stafford a 44 y.o.BM PMHx schizophrenia, bipolar disorder, mild electro disability disorder, anxiety,   Presented with new onset of seizures. Patient remained confused probably from seizure and postictal state, most history obtained from ED staff and review of patient's chart. Patient started to have visual and auditory hallucination"seeing and hearing from demons"3 days ago and came to ED for evaluation.Patient moved to live in a couple 3 to 4 months ago, and has had since experiencing twitching like movement of lips and tongue as well as uncontrolled movements of his legs. 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 Patient was sent back to group home and starting psychiatric medications. However, it appears that the patient continues to have bizarre movement and hallucinations, group homecalled[facility] this morning. EMS arrived and found the patient had seizure with whole body shaking for about 2 minutes.Patient had another witnessed seizure in the ED witnessed by the nurse, with brief episode of hypoxia oxygenation dropped to 70s. Whole episode lasted about a minute. CT head negative for intracranial acute etiology. Blood work, WBC 10, UDS negative,  UA negative  for UTI.  12/07/2019 rapid response overnight soon after admission - obtunded after ativan - no improvement with narcan or flumazanil but he is protecting airway - no intubation indicated. 12/08/2019 -much more awake alert oriented this morning appears to be back to baseline, concern for polypharmacy as such psychiatry was requested to follow along for medication management recommending decrease dose of clozapine 12/09/2019 -continues to tolerate new medication doses and therapy, likely disposition the next 24 to 48 hours pending clinical course, tolerance of new medications, lack of new seizure activity and safe disposition location, likely back to group home. 12/10/19 -set up for discharge back to group home, unfortunately PT recommended 24-hour care which group home cannot provide thus patient is currently being evaluated for SNF placement, case management working diligently on safe location for disposition. Patient clinically and medically stable for discharge at this time awaiting safe disposition.  Hospital Course:  See above   Consultations: Neurology Psychiatry    Discharge Exam: Vitals:   12/14/19 0328 12/14/19 0846 12/14/19 1200 12/14/19 1203  BP: 107/65  (!) 115/56 (!) 115/56  Pulse: 91     Resp:  19  20  Temp: 98.2 F (36.8 C) 98.2 F (36.8 C)  (!) 97.4 F (36.3 C)  TempSrc:  Oral  Oral  SpO2: 98%     Weight:      Height:        General: A/O x4, No acute respiratory distress Eyes: negative scleral hemorrhage, negative anisocoria, negative icterus ENT: Negative Runny nose, negative gingival bleeding, Neck:  Negative scars, masses, torticollis, lymphadenopathy, JVD Lungs: Clear to auscultation bilaterally without wheezes or crackles Cardiovascular: Regular rate and rhythm without murmur gallop or rub normal S1 and S2   Discharge Instructions  Discharge Instructions    Diet - low sodium heart healthy   Complete by: As directed    Increase activity slowly    Complete by: As directed      Allergies as of 12/14/2019   No Known Allergies     Medication List    STOP taking these medications   cloZAPine 100 MG tablet Commonly known as: CLOZARIL   divalproex 500 MG 24 hr tablet Commonly known as: DEPAKOTE ER Replaced by: divalproex 250 MG DR tablet   OLANZapine 10 MG tablet Commonly known as: ZYPREXA Replaced by: OLANZapine zydis 10 MG disintegrating tablet     TAKE these medications   benztropine 2 MG tablet Commonly known as: COGENTIN Take 1 tablet (2 mg total) by mouth daily.   divalproex 250 MG DR tablet Commonly known as: DEPAKOTE Take 3 tablets (750 mg total) by mouth every 12 (twelve) hours. Replaces: divalproex 500 MG 24 hr tablet   FLUoxetine 40 MG capsule Commonly known as: PROZAC Take 1 capsule (40 mg total) by mouth daily.   multivitamin with minerals Tabs tablet Take 1 tablet by mouth daily.   OLANZapine zydis 10 MG disintegrating tablet Commonly known as: ZYPREXA Take 1 tablet (10 mg total) by mouth at bedtime. Replaces: OLANZapine 10 MG tablet   propranolol ER 60 MG 24 hr capsule Commonly known as: INDERAL LA Take 1 capsule (60 mg total) by mouth daily.      No Known Allergies    The results of significant diagnostics from this hospitalization (including imaging, microbiology, ancillary and laboratory) are listed below for reference.    Significant Diagnostic Studies: DG Chest 1 View  Result Date: 12/06/2019 CLINICAL DATA:  Seizure EXAM: CHEST  1 VIEW COMPARISON:  04/04/2017 FINDINGS: The heart size and mediastinal contours are within normal limits. Both lungs are clear. The visualized skeletal structures are unremarkable. IMPRESSION: No active disease. Electronically Signed   By: Marlan Palauharles  Clark M.D.   On: 12/06/2019 19:04   DG Abd 1 View  Result Date: 12/06/2019 CLINICAL DATA:  Seizure EXAM: ABDOMEN - 1 VIEW COMPARISON:  None. FINDINGS: Nonobstructive bowel gas pattern. No organomegaly or free air  visualized. Study limited by patient motion and rotation. IMPRESSION: No acute findings. Electronically Signed   By: Charlett NoseKevin  Dover M.D.   On: 12/06/2019 20:55   CT Head Wo Contrast  Result Date: 12/06/2019 CLINICAL DATA:  Seizure. EXAM: CT HEAD WITHOUT CONTRAST TECHNIQUE: Contiguous axial images were obtained from the base of the skull through the vertex without intravenous contrast. COMPARISON:  Brain MRI 12/10/2018 FINDINGS: Brain: Brain volume is normal for age. No intracranial hemorrhage, mass effect, or midline shift. No hydrocephalus. The basilar cisterns are patent. No evidence of territorial infarct or acute ischemia. No extra-axial or intracranial fluid collection. Vascular: No hyperdense vessel or unexpected calcification. Skull: No fracture or focal lesion. Sinuses/Orbits: Complete opacification of left maxillary sinus with heterogeneous material and cortical thickening. Material extends through the ostiomeatal complex which appears widened. There is mucosal thickening and opacification of the left ethmoid air cells and left frontal sinus. Mastoid air cells are clear. Included orbits are unremarkable. Other: None. IMPRESSION: 1. No acute intracranial abnormality. 2. Chronic left-sided paranasal sinus disease. Consider elective ENT evaluation to exclude the possibility of underlying polyp or obstructing lesion. Electronically Signed   By: Narda RutherfordMelanie  Sanford M.D.   On: 12/06/2019 15:31   MR BRAIN WO CONTRAST  Result Date: 12/07/2019 CLINICAL DATA:  Initial evaluation for acute seizure. EXAM: MRI HEAD WITHOUT CONTRAST TECHNIQUE: Multiplanar, multiecho pulse sequences of the brain and surrounding structures were obtained without intravenous contrast. COMPARISON:  Prior CT from 12/06/2019. FINDINGS: Brain: Cerebral volume within normal limits for age. No focal parenchymal signal abnormality. No abnormal foci of restricted to suggest acute or subacute ischemia or changes related to seizure. Gray-white  matter differentiation maintained. No encephalomalacia gliosis to suggest chronic cortical infarction or other insult. No foci of susceptibility artifact to suggest acute or chronic intracranial hemorrhage. No mass lesion, midline shift or mass effect. No hydrocephalus or extra-axial fluid collection. Pituitary gland suprasellar region normal. Midline structures intact. No intrinsic temporal lobe abnormality. Vascular: Major intracranial vascular flow voids are well maintained. Skull and upper cervical spine: Craniocervical junction within normal limits. Bone marrow signal intensity normal. No scalp soft tissue abnormality. Sinuses/Orbits: Globes and orbital soft tissues within normal limits. Extensive left frontal, ethmoidal, and maxillary sinusitis, chronic in appearance. No mastoid effusion. Inner ear structures within normal limits. Other: None. IMPRESSION: 1. Normal brain MRI. No acute intracranial abnormality identified. 2. Extensive left-sided paranasal sinusitis, chronic in appearance. Electronically Signed   By: Rise MuBenjamin  McClintock M.D.   On: 12/07/2019 02:15   DG Pelvis Portable  Result Date: 12/06/2019 CLINICAL DATA:  Seizure EXAM: PORTABLE PELVIS 1-2 VIEWS COMPARISON:  None. FINDINGS: Hip joints and SI joints symmetric and unremarkable. No acute bony abnormality. Specifically, no fracture, subluxation, or dislocation. IMPRESSION: No acute bony abnormality. Electronically Signed   By: Charlett NoseKevin  Dover M.D.   On: 12/06/2019 20:55   EEG adult  Result Date: 12/06/2019 Charlsie QuestYadav, Priyanka O, MD     12/06/2019  6:10 PM Patient Name: Brian Stafford MRN: [id number] Epilepsy Attending: Charlsie QuestPriyanka O Yadav Referring Physician/Provider: Dr Clemon ChambersMarie Francois Date: 12/06/2019 Duration:  30.07 mins Patient history: 44 year old male with new seizure activity. EEG to evaluate for seizure. Level of alertness: Awake, asleep AEDs during EEG study: VPA Technical aspects: This EEG study was done with scalp electrodes positioned  according to the 10-20 International system of electrode placement. Electrical activity was acquired at a sampling rate of 500Hz  and reviewed with a high frequency filter of 70Hz  and a low frequency filter of 1Hz . EEG data were recorded continuously and digitally stored. Description: The posterior dominant rhythm consists of 9 Hz activity of moderate voltage (25-35 uV) seen predominantly in posterior head regions, symmetric and reactive to eye opening and eye closing. Sleep was characterized by vertex waves, sleep spindles (12 to 14 Hz), maximal frontocentral region.   Hyperventilation and photic stimulation were not performed.   Of note, parts of eeg were difficult to interpret due to significant movement artifact. IMPRESSION: This technically difficult study study is within normal limits. No seizures or epileptiform discharges were seen throughout the recording. '   Microbiology: No results found for this or any previous visit (from the past 240 hour(s)).   Labs: Basic Metabolic Panel: Recent Labs  Lab 12/08/19 0349 12/09/19 0808 12/10/19 0200 12/11/19 0127 12/12/19 0232 12/13/19 0325 12/14/19 0814  NA 142   < > 141 140 141 140 135  K 3.4*   < > 3.8 3.7 4.1 4.2 3.9  CL 110   < > 106 105 104 104 104  CO2 20*   < > 26 28 26 27 22   GLUCOSE 78   < > 122* 107* 104* 105* 100*  BUN 12   < > 12 13 14 17 16   CREATININE 1.15   < > 0.96 1.00 1.21 1.18 1.08  CALCIUM 8.8*   < > 9.0 9.0 9.4 8.9 8.7*  MG 2.2  --   --   --  2.2 2.1 2.2  PHOS 2.1*  --   --   --  4.1 4.1 2.8   < > = values in this interval not displayed.   Liver Function Tests: Recent Labs  Lab 12/10/19 0200 12/11/19 0127 12/12/19 0232 12/13/19 0325 12/14/19 0814  AST 33 27 34 26 30  ALT 24 24 35 32 34  ALKPHOS 64 57 74 75 92  BILITOT 0.4 0.6 0.7 0.6 0.5  PROT 6.3* 6.2* 7.1 6.7 6.4*  ALBUMIN 2.9* 2.8* 3.3* 3.1* 3.0*   No results for input(s): LIPASE, AMYLASE in the last 168 hours. No results for  input(s): AMMONIA in the last 168 hours. CBC: Recent Labs  Lab 12/10/19 0200 12/11/19 0127 12/12/19 0232 12/13/19 0325 12/14/19 0814  WBC 5.6 4.8 6.0 5.2 3.9*  HGB 11.8* 11.3* 12.6* 11.7* 11.5*  HCT 37.3* 35.0* 40.2 37.4* 35.4*  MCV 86.3 84.5 84.8 85.2 83.3  PLT 275 304 335 279 282   Cardiac Enzymes: No results for input(s): CKTOTAL, CKMB, CKMBINDEX, TROPONINI in the last 168 hours. BNP: BNP (last 3 results) No results for input(s): BNP in the last 8760 hours.  ProBNP (last 3 results) No results for input(s): PROBNP in the last 8760 hours.  CBG: No results for input(s): GLUCAP in the last 168 hours.     Signed:  13/09/21, MD [facility] [phone] pager

## 2019-12-14 NOTE — Plan of Care (Signed)
  Problem: Education: Goal: Expressions of having a comfortable level of knowledge regarding the disease process will increase Outcome: Adequate for Discharge   Problem: Coping: Goal: Ability to adjust to condition or change in health will improve Outcome: Adequate for Discharge Goal: Ability to identify appropriate support needs will improve Outcome: Adequate for Discharge   Problem: Education: Goal: Knowledge of General Education information will improve Description: Including pain rating scale, medication(s)/side effects and non-pharmacologic comfort measures Outcome: Adequate for Discharge   Problem: Health Behavior/Discharge Planning: Goal: Ability to manage health-related needs will improve Outcome: Adequate for Discharge   Problem: Clinical Measurements: Goal: Ability to maintain clinical measurements within normal limits will improve Outcome: Adequate for Discharge Goal: Will remain free from infection Outcome: Adequate for Discharge Goal: Diagnostic test results will improve Outcome: Adequate for Discharge Goal: Respiratory complications will improve Outcome: Adequate for Discharge Goal: Cardiovascular complication will be avoided Outcome: Adequate for Discharge   Problem: Activity: Goal: Risk for activity intolerance will decrease Outcome: Adequate for Discharge   Problem: Nutrition: Goal: Adequate nutrition will be maintained Outcome: Adequate for Discharge   Problem: Coping: Goal: Level of anxiety will decrease Outcome: Adequate for Discharge   Problem: Elimination: Goal: Will not experience complications related to bowel motility Outcome: Adequate for Discharge Goal: Will not experience complications related to urinary retention Outcome: Adequate for Discharge   Problem: Pain Managment: Goal: General experience of comfort will improve Outcome: Adequate for Discharge   Problem: Safety: Goal: Ability to remain free from injury will improve Outcome:  Adequate for Discharge   Problem: Skin Integrity: Goal: Risk for impaired skin integrity will decrease Outcome: Adequate for Discharge   Problem: Education: Goal: Expressions of having a comfortable level of knowledge regarding the disease process will increase Outcome: Adequate for Discharge   Problem: Coping: Goal: Ability to adjust to condition or change in health will improve Outcome: Adequate for Discharge Goal: Ability to identify appropriate support needs will improve Outcome: Adequate for Discharge   Problem: Health Behavior/Discharge Planning: Goal: Compliance with prescribed medication regimen will improve Outcome: Adequate for Discharge   Problem: Medication: Goal: Risk for medication side effects will decrease Outcome: Adequate for Discharge   Problem: Clinical Measurements: Goal: Complications related to the disease process, condition or treatment will be avoided or minimized Outcome: Adequate for Discharge Goal: Diagnostic test results will improve Outcome: Adequate for Discharge   Problem: Safety: Goal: Verbalization of understanding the information provided will improve Outcome: Adequate for Discharge   Problem: Self-Concept: Goal: Level of anxiety will decrease Outcome: Adequate for Discharge Goal: Ability to verbalize feelings about condition will improve Outcome: Adequate for Discharge   Patient has bed at previous Group Home.  Arrangements made to go there with [facility].  Patient states he "wants to stay here."  I told him that this was not possible, but that if he would like to visit, he was certainly welcome to do so.

## 2019-12-14 NOTE — TOC Progression Note (Signed)
Transition of Care [facility]) - Progression Note    Patient Details  Name: Brian Stafford MRN: [id number] Date of Birth: Nov 28, 1975  [facility] CM/SW Contact  Annalee Genta, LCSW Phone Number: 12/14/2019, 11:58 AM  Clinical Narrative:    CSW reached out to [name] at patient's group home to discuss discharge planning. Per Ailene Ravel they requested for CSW to reach out to the owner, Josephine at [phone]. CSW is aware disposition is pending PT re-evaluation to determine is patient needs SNF care prior to returning to group home and is aware group home feels patient needs further medical care prior to returning. CSW is continuing to follow to facilitate disposition pending PT re-eval.    Expected Discharge Plan: Group Home Barriers to Discharge: No Barriers Identified  Expected Discharge Plan and Services Expected Discharge Plan: Group Home   Discharge Planning Services: CM Consult, Medication Assistance   Living arrangements for the past 2 months: Group Home Expected Discharge Date: 12/10/19                                     Social Determinants of Health (SDOH) Interventions    Readmission Risk Interventions Readmission Risk Prevention Plan 12/10/2019  Transportation Screening Complete  PCP or Specialist Appt within 5-7 Days Complete  Home Care Screening Complete  Medication Review (RN CM) Complete  Some recent data might be hidden

## 2019-12-14 NOTE — Progress Notes (Signed)
Patient rested well without any safety sitter at the bedside. No issue noted. Will continue to monitor.

## 2019-12-14 NOTE — Progress Notes (Signed)
Physical Therapy Treatment Patient Details Name: Brian Stafford MRN: [id number] DOB: 12-11-1975 Today's Date: 12/14/2019    History of Present Illness Patient is a 44 y.o. male with PMH: schizophrenia, bipolar disorder, anxiety, intellectual disability, who presented to hospital from group home where he lives with new onset seizures which were witnessed by group home staff. UA negative for UTI, CT head negative for intracranial acute etiology. In hospital, he had decreased responsiveness and respirations after receiving Ativan but did not require intubation.     PT Comments    Pt very paranoid and anxious constantly surveying his surroundings and people near him however was able to ambulate and perform transfers with supervision. Pt amb 400' and was able to go around obstacles and turned head in all directions without LOB. Pt with improved DGI score to 22/24 on DGI indicating minimal falls risk.  Pt with known cognitive deficits requiring 24/7 supervision for safety, management of meds, and IADLs. PT feels pt is appropriate to return home to Group Home as long as 24/7 supervision is available.    Follow Up Recommendations  No PT follow up;Supervision/Assistance - 24 hour (24/7 supervision for cognition)     Equipment Recommendations  None recommended by PT    Recommendations for Other Services       Precautions / Restrictions Precautions Precautions: Fall Restrictions Weight Bearing Restrictions: No    Mobility  Bed Mobility Overal bed mobility: Modified Independent Bed Mobility: Supine to Sit;Sit to Supine     Supine to sit: Modified independent (Device/Increase time) Sit to supine: Modified independent (Device/Increase time)   General bed mobility comments: pt with nervous/paranoia look but completed without difficulty  Transfers Overall transfer level: Needs assistance Equipment used: None Transfers: Sit to/from Stand Sit to Stand: Supervision         General  transfer comment: pt guarded from paranoia stand point but able to complete transfer without physical assist  Ambulation/Gait Ambulation/Gait assistance: Min guard Gait Distance (Feet): 400 Feet Assistive device: None Gait Pattern/deviations: Step-through pattern Gait velocity: dec Gait velocity interpretation: >2.62 ft/sec, indicative of community ambulatory General Gait Details: pt ambulated around obstacles and people without LOB or instability. Pt very paranoid and looking at all things, minimal conversation but able to physical function without assist   Stairs             Wheelchair Mobility    Modified Rankin (Stroke Patients Only)       Balance Overall balance assessment: Needs assistance Sitting-balance support: No upper extremity supported;Feet supported Sitting balance-Leahy Scale: Good     Standing balance support: No upper extremity supported Standing balance-Leahy Scale: Good                   Standardized Balance Assessment Standardized Balance Assessment : Dynamic Gait Index   Dynamic Gait Index Level Surface: Normal Change in Gait Speed: Normal Gait with Horizontal Head Turns: Normal Gait with Vertical Head Turns: Normal Gait and Pivot Turn: Normal Step Over Obstacle: Mild Impairment Step Around Obstacles: Normal Steps: Mild Impairment Total Score: 22      Cognition Arousal/Alertness: Awake/alert Behavior During Therapy: WFL for tasks assessed/performed Overall Cognitive Status: History of cognitive impairments - at baseline                                 General Comments: pt with know schizophrenia and bipolar disorder as well as intellectual delay  Exercises      General Comments General comments (skin integrity, edema, etc.): VSS      Pertinent Vitals/Pain Pain Assessment: No/denies pain    Home Living                      Prior Function            PT Goals (current goals can now be  found in the care plan section) Progress towards PT goals: Progressing toward goals    Frequency    Min 3X/week      PT Plan Current plan remains appropriate    Co-evaluation              AM-PAC PT "6 Clicks" Mobility   Outcome Measure  Help needed turning from your back to your side while in a flat bed without using bedrails?: None Help needed moving from lying on your back to sitting on the side of a flat bed without using bedrails?: None Help needed moving to and from a bed to a chair (including a wheelchair)?: None Help needed standing up from a chair using your arms (e.g., wheelchair or bedside chair)?: None Help needed to walk in hospital room?: None Help needed climbing 3-5 steps with a railing? : A Little 6 Click Score: 23    End of Session Equipment Utilized During Treatment: Gait belt Activity Tolerance: Patient tolerated treatment well Patient left: with call bell/phone within reach;with nursing/sitter in room;in bed Nurse Communication: Mobility status PT Visit Diagnosis: Unsteadiness on feet (R26.81);Other symptoms and signs involving the nervous system (R29.898)     Time: 0932-3557 PT Time Calculation (min) (ACUTE ONLY): 20 min  Charges:  $Gait Training: 8-22 mins                     [name], PT, DPT Acute Rehabilitation Services Pager #: [phone] Office #: [phone]    [name] 12/14/2019, 2:04 PM

## 2019-12-14 NOTE — TOC Transition Note (Signed)
Transition of Care [facility]) - CM/SW Discharge Note   Patient Details  Name: Brian Stafford MRN: [id number] Date of Birth: 26-Jan-1976  Transition of Care [facility]) CM/SW Contact:  Annalee Genta, LCSW Phone Number: 12/14/2019, 2:57 PM   Clinical Narrative:      Patient will discharge to: Group home, [address], [place], [place] Transport by: [facility]  CSW coordinated with [facility] and leadership to support discharge. Per PT patient no longer meets criteria for SNF. CSW spoke with group home owner [name] and confirmed patient is ready for discharge and noted group homes concern. Group home accepted patient back but is unable to transport patient. CSW discussed with RN and noted patient is not appropriate for taxi voucher due to cognitive impairment. CSW arranged [facility] transportation. CSW prepared discharge packet and brought it up to the unit.   Rosette Reveal, LCSW, LCAS Clinical Social Worker II Emergency Department, [facility] Transitions of Care department, [facility] [phone]    Final next level of care: Group Home Barriers to Discharge: No Barriers Identified   Patient Goals and CMS Choice        Discharge Placement                       Discharge Plan and Services   Discharge Planning Services: CM Consult, Medication Assistance                                 Social Determinants of Health (SDOH) Interventions     Readmission Risk Interventions Readmission Risk Prevention Plan 12/10/2019  Transportation Screening Complete  PCP or Specialist Appt within 5-7 Days Complete  Home Care Screening Complete  Medication Review (RN CM) Complete  Some recent data might be hidden

## 2019-12-14 NOTE — Progress Notes (Signed)
Patient is to be discharge back to the Group Home today. Vernona Rieger with Physical Therapy called. She will send out a page for a Therapist to see the patient as soon as possible. Alexis Goodell Transition of Care Supervisor [phone]

## 2020-03-23 ENCOUNTER — Encounter (HOSPITAL_COMMUNITY): Payer: Self-pay | Admitting: Emergency Medicine

## 2020-03-23 ENCOUNTER — Emergency Department (HOSPITAL_COMMUNITY)
Admission: EM | Admit: 2020-03-23 | Discharge: 2020-03-24 | Disposition: A | Discharge status: Home / Self Care | Payer: 59 | Attending: Emergency Medicine | Admitting: Emergency Medicine

## 2020-03-23 DIAGNOSIS — R4689 Other symptoms and signs involving appearance and behavior: Secondary | ICD-10-CM

## 2020-03-23 DIAGNOSIS — R456 Violent behavior: Secondary | ICD-10-CM | POA: Diagnosis present

## 2020-03-23 DIAGNOSIS — Z20822 Contact with and (suspected) exposure to covid-19: Secondary | ICD-10-CM | POA: Insufficient documentation

## 2020-03-23 DIAGNOSIS — Z79899 Other long term (current) drug therapy: Secondary | ICD-10-CM | POA: Insufficient documentation
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
## 2020-03-24 ENCOUNTER — Other Ambulatory Visit: Payer: Self-pay

## 2020-03-24 ENCOUNTER — Encounter (HOSPITAL_COMMUNITY): Payer: Self-pay | Admitting: Emergency Medicine

## 2020-03-24 LAB — RAPID URINE DRUG SCREEN, HOSP PERFORMED
Amphetamines: NOT DETECTED
Barbiturates: NOT DETECTED
Benzodiazepines: NOT DETECTED
Cocaine: NOT DETECTED
Opiates: NOT DETECTED
Tetrahydrocannabinol: NOT DETECTED

## 2020-03-24 LAB — CBC WITH DIFFERENTIAL/PLATELET
Abs Immature Granulocytes: 0.01 10*3/uL (ref 0.00–0.07)
Basophils Absolute: 0 10*3/uL (ref 0.0–0.1)
Basophils Relative: 0 %
Eosinophils Absolute: 0 10*3/uL (ref 0.0–0.5)
Eosinophils Relative: 0 %
HCT: 40.1 % (ref 39.0–52.0)
Hemoglobin: 13.3 g/dL (ref 13.0–17.0)
Immature Granulocytes: 0 %
Lymphocytes Relative: 34 %
Lymphs Abs: 1.7 10*3/uL (ref 0.7–4.0)
MCH: 28.4 pg (ref 26.0–34.0)
MCHC: 33.2 g/dL (ref 30.0–36.0)
MCV: 85.5 fL (ref 80.0–100.0)
Monocytes Absolute: 0.4 10*3/uL (ref 0.1–1.0)
Monocytes Relative: 8 %
Neutro Abs: 2.9 10*3/uL (ref 1.7–7.7)
Neutrophils Relative %: 58 %
Platelets: 230 10*3/uL (ref 150–400)
RBC: 4.69 MIL/uL (ref 4.22–5.81)
RDW: 13.4 % (ref 11.5–15.5)
WBC: 4.9 10*3/uL (ref 4.0–10.5)
nRBC: 0 % (ref 0.0–0.2)

## 2020-03-24 LAB — COMPREHENSIVE METABOLIC PANEL
ALT: 17 U/L (ref 0–44)
AST: 21 U/L (ref 15–41)
Albumin: 4 g/dL (ref 3.5–5.0)
Alkaline Phosphatase: 76 U/L (ref 38–126)
Anion gap: 8 (ref 5–15)
BUN: 19 mg/dL (ref 6–20)
CO2: 29 mmol/L (ref 22–32)
Calcium: 9.6 mg/dL (ref 8.9–10.3)
Chloride: 101 mmol/L (ref 98–111)
Creatinine, Ser: 1.25 mg/dL — ABNORMAL HIGH (ref 0.61–1.24)
GFR, Estimated: >60 mL/min (ref >60)
Glucose, Bld: 104 mg/dL — ABNORMAL HIGH (ref 70–99)
Potassium: 3.7 mmol/L (ref 3.5–5.1)
Sodium: 138 mmol/L (ref 135–145)
Total Bilirubin: 0.6 mg/dL (ref 0.3–1.2)
Total Protein: 7.4 g/dL (ref 6.5–8.1)

## 2020-03-24 LAB — RESP PANEL BY RT-PCR (FLU A&B, COVID) ARPGX2
Influenza A by PCR: NEGATIVE
Influenza B by PCR: NEGATIVE
SARS Coronavirus 2 by RT PCR: NEGATIVE

## 2020-03-24 LAB — SALICYLATE LEVEL: Salicylate Lvl: <7 mg/dL — ABNORMAL LOW (ref 7.0–30.0)

## 2020-03-24 LAB — ETHANOL: Alcohol, Ethyl (B): <10 mg/dL (ref <10)

## 2020-03-24 LAB — VALPROIC ACID LEVEL: Valproic Acid Lvl: 43 ug/mL — ABNORMAL LOW (ref 50.0–100.0)

## 2020-03-24 LAB — ACETAMINOPHEN LEVEL: Acetaminophen (Tylenol), Serum: <10 ug/mL — ABNORMAL LOW (ref 10–30)

## 2020-03-24 MED ORDER — ALUM & MAG HYDROXIDE-SIMETH 200-200-20 MG/5ML PO SUSP: 30.0000 mL | Freq: Four times a day (QID) | ORAL | Status: DC | PRN

## 2020-03-24 MED ORDER — ACETAMINOPHEN 325 MG PO TABS: 650.0000 mg | ORAL_TABLET | ORAL | Status: DC | PRN

## 2020-03-24 MED ORDER — BENZTROPINE MESYLATE 1 MG PO TABS
2.0000 mg | ORAL_TABLET | Freq: Every day | ORAL | Status: DC
  Administered 2020-03-24: 2 mg via ORAL
  Filled 2020-03-24: qty 2

## 2020-03-24 MED ORDER — FLUOXETINE HCL 20 MG PO CAPS
40.0000 mg | ORAL_CAPSULE | Freq: Every day | ORAL | Status: DC
  Administered 2020-03-24: 40 mg via ORAL
  Filled 2020-03-24: qty 2

## 2020-03-24 MED ORDER — ONDANSETRON HCL 4 MG PO TABS: 4.0000 mg | ORAL_TABLET | Freq: Three times a day (TID) | ORAL | Status: DC | PRN

## 2020-03-24 NOTE — ED Notes (Signed)
Spoke with [facility], instructed to return patient to [address] if returned before 2:30PM. NS to Education administrator.
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
## 2020-03-24 NOTE — Discharge Instructions (Signed)
1. Continue to see your counselors and primary care doctors on a regular basis. Schedule follow-up with each as soon as possible
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
## 2020-12-15 ENCOUNTER — Other Ambulatory Visit: Payer: Self-pay | Admitting: Internal Medicine

## 2020-12-16 LAB — COMPLETE METABOLIC PANEL WITH GFR
AG Ratio: 1.5 (calc) (ref 1.0–2.5)
ALT: 18 U/L (ref 9–46)
AST: 20 U/L (ref 10–40)
Albumin: 4.3 g/dL (ref 3.6–5.1)
Alkaline phosphatase (APISO): 95 U/L (ref 36–130)
BUN: 14 mg/dL (ref 7–25)
CO2: 26 mmol/L (ref 20–32)
Calcium: 9 mg/dL (ref 8.6–10.3)
Chloride: 103 mmol/L (ref 98–110)
Creat: 1.25 mg/dL (ref 0.60–1.29)
Globulin: 2.9 g/dL (calc) (ref 1.9–3.7)
Glucose, Bld: 48 mg/dL — ABNORMAL LOW (ref 65–99)
Potassium: 3.8 mmol/L (ref 3.5–5.3)
Sodium: 139 mmol/L (ref 135–146)
Total Bilirubin: 0.5 mg/dL (ref 0.2–1.2)
Total Protein: 7.2 g/dL (ref 6.1–8.1)
eGFR: 73 mL/min/{1.73_m2} (ref >=60)

## 2020-12-16 LAB — LIPID PANEL
Cholesterol: 166 mg/dL (ref <200)
HDL: 31 mg/dL — ABNORMAL LOW (ref >=40)
LDL Cholesterol (Calc): 100 mg/dL (calc) — ABNORMAL HIGH
Non-HDL Cholesterol (Calc): 135 mg/dL (calc) — ABNORMAL HIGH (ref <130)
Total CHOL/HDL Ratio: 5.4 (calc) — ABNORMAL HIGH (ref <5.0)
Triglycerides: 228 mg/dL — ABNORMAL HIGH (ref <150)

## 2020-12-16 LAB — CBC
HCT: 39.6 % (ref 38.5–50.0)
Hemoglobin: 12.6 g/dL — ABNORMAL LOW (ref 13.2–17.1)
MCH: 27 pg (ref 27.0–33.0)
MCHC: 31.8 g/dL — ABNORMAL LOW (ref 32.0–36.0)
MCV: 84.8 fL (ref 80.0–100.0)
MPV: 9.8 fL (ref 7.5–12.5)
Platelets: 261 10*3/uL (ref 140–400)
RBC: 4.67 10*6/uL (ref 4.20–5.80)
RDW: 12.8 % (ref 11.0–15.0)
WBC: 5.3 10*3/uL (ref 3.8–10.8)

## 2020-12-16 LAB — TSH: TSH: 0.79 mIU/L (ref 0.40–4.50)

## 2020-12-16 LAB — VITAMIN D 25 HYDROXY (VIT D DEFICIENCY, FRACTURES): Vit D, 25-Hydroxy: 14 ng/mL — ABNORMAL LOW (ref 30–100)

## 2020-12-16 LAB — PSA: PSA: 0.37 ng/mL (ref <=4.00)

## 2021-07-08 DIAGNOSIS — F70 Mild intellectual disabilities: Secondary | ICD-10-CM | POA: Diagnosis not present

## 2021-07-08 DIAGNOSIS — F4010 Social phobia, unspecified: Secondary | ICD-10-CM | POA: Diagnosis not present

## 2021-07-08 DIAGNOSIS — E889 Metabolic disorder, unspecified: Secondary | ICD-10-CM | POA: Diagnosis not present

## 2021-07-08 DIAGNOSIS — F411 Generalized anxiety disorder: Secondary | ICD-10-CM | POA: Diagnosis not present

## 2021-07-08 DIAGNOSIS — F429 Obsessive-compulsive disorder, unspecified: Secondary | ICD-10-CM | POA: Diagnosis not present

## 2021-07-08 DIAGNOSIS — F209 Schizophrenia, unspecified: Secondary | ICD-10-CM | POA: Diagnosis not present

## 2021-07-13 DIAGNOSIS — G40A09 Absence epileptic syndrome, not intractable, without status epilepticus: Secondary | ICD-10-CM | POA: Diagnosis not present

## 2021-07-13 DIAGNOSIS — R32 Unspecified urinary incontinence: Secondary | ICD-10-CM | POA: Diagnosis not present

## 2021-07-13 DIAGNOSIS — F2089 Other schizophrenia: Secondary | ICD-10-CM | POA: Diagnosis not present

## 2021-07-13 DIAGNOSIS — K219 Gastro-esophageal reflux disease without esophagitis: Secondary | ICD-10-CM | POA: Diagnosis not present

## 2021-07-13 DIAGNOSIS — K5909 Other constipation: Secondary | ICD-10-CM | POA: Diagnosis not present

## 2021-07-20 DIAGNOSIS — F429 Obsessive-compulsive disorder, unspecified: Secondary | ICD-10-CM | POA: Diagnosis not present

## 2021-07-20 DIAGNOSIS — F411 Generalized anxiety disorder: Secondary | ICD-10-CM | POA: Diagnosis not present

## 2021-07-20 DIAGNOSIS — F209 Schizophrenia, unspecified: Secondary | ICD-10-CM | POA: Diagnosis not present

## 2021-07-20 DIAGNOSIS — F70 Mild intellectual disabilities: Secondary | ICD-10-CM | POA: Diagnosis not present

## 2021-07-20 DIAGNOSIS — F4010 Social phobia, unspecified: Secondary | ICD-10-CM | POA: Diagnosis not present

## 2021-08-04 DIAGNOSIS — E889 Metabolic disorder, unspecified: Secondary | ICD-10-CM | POA: Diagnosis not present

## 2021-08-04 DIAGNOSIS — F209 Schizophrenia, unspecified: Secondary | ICD-10-CM | POA: Diagnosis not present

## 2021-08-04 DIAGNOSIS — F411 Generalized anxiety disorder: Secondary | ICD-10-CM | POA: Diagnosis not present

## 2021-08-04 DIAGNOSIS — F70 Mild intellectual disabilities: Secondary | ICD-10-CM | POA: Diagnosis not present

## 2021-08-04 DIAGNOSIS — F4010 Social phobia, unspecified: Secondary | ICD-10-CM | POA: Diagnosis not present

## 2021-08-04 DIAGNOSIS — F429 Obsessive-compulsive disorder, unspecified: Secondary | ICD-10-CM | POA: Diagnosis not present

## 2021-08-16 DIAGNOSIS — F429 Obsessive-compulsive disorder, unspecified: Secondary | ICD-10-CM | POA: Diagnosis not present

## 2021-08-16 DIAGNOSIS — F209 Schizophrenia, unspecified: Secondary | ICD-10-CM | POA: Diagnosis not present

## 2021-08-16 DIAGNOSIS — F411 Generalized anxiety disorder: Secondary | ICD-10-CM | POA: Diagnosis not present

## 2021-08-16 DIAGNOSIS — F4010 Social phobia, unspecified: Secondary | ICD-10-CM | POA: Diagnosis not present

## 2021-08-16 DIAGNOSIS — F70 Mild intellectual disabilities: Secondary | ICD-10-CM | POA: Diagnosis not present

## 2021-09-13 DIAGNOSIS — F70 Mild intellectual disabilities: Secondary | ICD-10-CM | POA: Diagnosis not present

## 2021-09-13 DIAGNOSIS — F429 Obsessive-compulsive disorder, unspecified: Secondary | ICD-10-CM | POA: Diagnosis not present

## 2021-09-13 DIAGNOSIS — E889 Metabolic disorder, unspecified: Secondary | ICD-10-CM | POA: Diagnosis not present

## 2021-09-13 DIAGNOSIS — F411 Generalized anxiety disorder: Secondary | ICD-10-CM | POA: Diagnosis not present

## 2021-09-13 DIAGNOSIS — F4010 Social phobia, unspecified: Secondary | ICD-10-CM | POA: Diagnosis not present

## 2021-09-13 DIAGNOSIS — F209 Schizophrenia, unspecified: Secondary | ICD-10-CM | POA: Diagnosis not present

## 2021-09-15 DIAGNOSIS — F70 Mild intellectual disabilities: Secondary | ICD-10-CM | POA: Diagnosis not present

## 2021-09-15 DIAGNOSIS — F429 Obsessive-compulsive disorder, unspecified: Secondary | ICD-10-CM | POA: Diagnosis not present

## 2021-09-15 DIAGNOSIS — F209 Schizophrenia, unspecified: Secondary | ICD-10-CM | POA: Diagnosis not present

## 2021-09-15 DIAGNOSIS — F411 Generalized anxiety disorder: Secondary | ICD-10-CM | POA: Diagnosis not present

## 2021-09-15 DIAGNOSIS — F4010 Social phobia, unspecified: Secondary | ICD-10-CM | POA: Diagnosis not present

## 2021-10-11 DIAGNOSIS — F70 Mild intellectual disabilities: Secondary | ICD-10-CM | POA: Diagnosis not present

## 2021-10-11 DIAGNOSIS — F429 Obsessive-compulsive disorder, unspecified: Secondary | ICD-10-CM | POA: Diagnosis not present

## 2021-10-11 DIAGNOSIS — F209 Schizophrenia, unspecified: Secondary | ICD-10-CM | POA: Diagnosis not present

## 2021-10-11 DIAGNOSIS — F4010 Social phobia, unspecified: Secondary | ICD-10-CM | POA: Diagnosis not present

## 2021-10-11 DIAGNOSIS — E889 Metabolic disorder, unspecified: Secondary | ICD-10-CM | POA: Diagnosis not present

## 2021-10-11 DIAGNOSIS — F411 Generalized anxiety disorder: Secondary | ICD-10-CM | POA: Diagnosis not present

## 2021-10-12 DIAGNOSIS — F209 Schizophrenia, unspecified: Secondary | ICD-10-CM | POA: Diagnosis not present

## 2021-10-12 DIAGNOSIS — F429 Obsessive-compulsive disorder, unspecified: Secondary | ICD-10-CM | POA: Diagnosis not present

## 2021-10-12 DIAGNOSIS — F4010 Social phobia, unspecified: Secondary | ICD-10-CM | POA: Diagnosis not present

## 2021-10-12 DIAGNOSIS — F411 Generalized anxiety disorder: Secondary | ICD-10-CM | POA: Diagnosis not present

## 2021-10-12 DIAGNOSIS — F70 Mild intellectual disabilities: Secondary | ICD-10-CM | POA: Diagnosis not present

## 2021-11-08 DIAGNOSIS — F411 Generalized anxiety disorder: Secondary | ICD-10-CM | POA: Diagnosis not present

## 2021-11-08 DIAGNOSIS — F4010 Social phobia, unspecified: Secondary | ICD-10-CM | POA: Diagnosis not present

## 2021-11-08 DIAGNOSIS — F209 Schizophrenia, unspecified: Secondary | ICD-10-CM | POA: Diagnosis not present

## 2021-11-08 DIAGNOSIS — F70 Mild intellectual disabilities: Secondary | ICD-10-CM | POA: Diagnosis not present

## 2021-11-08 DIAGNOSIS — F429 Obsessive-compulsive disorder, unspecified: Secondary | ICD-10-CM | POA: Diagnosis not present

## 2021-11-09 DIAGNOSIS — E889 Metabolic disorder, unspecified: Secondary | ICD-10-CM | POA: Diagnosis not present

## 2021-11-09 DIAGNOSIS — F4010 Social phobia, unspecified: Secondary | ICD-10-CM | POA: Diagnosis not present

## 2021-11-09 DIAGNOSIS — F209 Schizophrenia, unspecified: Secondary | ICD-10-CM | POA: Diagnosis not present

## 2021-11-09 DIAGNOSIS — F70 Mild intellectual disabilities: Secondary | ICD-10-CM | POA: Diagnosis not present

## 2021-11-09 DIAGNOSIS — F429 Obsessive-compulsive disorder, unspecified: Secondary | ICD-10-CM | POA: Diagnosis not present

## 2021-11-09 DIAGNOSIS — F411 Generalized anxiety disorder: Secondary | ICD-10-CM | POA: Diagnosis not present

## 2021-11-16 DIAGNOSIS — R32 Unspecified urinary incontinence: Secondary | ICD-10-CM | POA: Diagnosis not present

## 2021-11-16 DIAGNOSIS — K5909 Other constipation: Secondary | ICD-10-CM | POA: Diagnosis not present

## 2021-11-16 DIAGNOSIS — F2089 Other schizophrenia: Secondary | ICD-10-CM | POA: Diagnosis not present

## 2021-12-03 DIAGNOSIS — F411 Generalized anxiety disorder: Secondary | ICD-10-CM | POA: Diagnosis not present

## 2021-12-03 DIAGNOSIS — F429 Obsessive-compulsive disorder, unspecified: Secondary | ICD-10-CM | POA: Diagnosis not present

## 2021-12-03 DIAGNOSIS — F209 Schizophrenia, unspecified: Secondary | ICD-10-CM | POA: Diagnosis not present

## 2021-12-03 DIAGNOSIS — F4010 Social phobia, unspecified: Secondary | ICD-10-CM | POA: Diagnosis not present

## 2021-12-03 DIAGNOSIS — F70 Mild intellectual disabilities: Secondary | ICD-10-CM | POA: Diagnosis not present

## 2021-12-08 DIAGNOSIS — E889 Metabolic disorder, unspecified: Secondary | ICD-10-CM | POA: Diagnosis not present

## 2021-12-08 DIAGNOSIS — F70 Mild intellectual disabilities: Secondary | ICD-10-CM | POA: Diagnosis not present

## 2021-12-08 DIAGNOSIS — F429 Obsessive-compulsive disorder, unspecified: Secondary | ICD-10-CM | POA: Diagnosis not present

## 2021-12-08 DIAGNOSIS — F411 Generalized anxiety disorder: Secondary | ICD-10-CM | POA: Diagnosis not present

## 2021-12-08 DIAGNOSIS — F4010 Social phobia, unspecified: Secondary | ICD-10-CM | POA: Diagnosis not present

## 2021-12-08 DIAGNOSIS — F209 Schizophrenia, unspecified: Secondary | ICD-10-CM | POA: Diagnosis not present

## 2021-12-29 DIAGNOSIS — F4010 Social phobia, unspecified: Secondary | ICD-10-CM | POA: Diagnosis not present

## 2021-12-29 DIAGNOSIS — F429 Obsessive-compulsive disorder, unspecified: Secondary | ICD-10-CM | POA: Diagnosis not present

## 2021-12-29 DIAGNOSIS — F411 Generalized anxiety disorder: Secondary | ICD-10-CM | POA: Diagnosis not present

## 2021-12-29 DIAGNOSIS — F70 Mild intellectual disabilities: Secondary | ICD-10-CM | POA: Diagnosis not present

## 2021-12-29 DIAGNOSIS — F209 Schizophrenia, unspecified: Secondary | ICD-10-CM | POA: Diagnosis not present

## 2022-01-04 DIAGNOSIS — F4010 Social phobia, unspecified: Secondary | ICD-10-CM | POA: Diagnosis not present

## 2022-01-04 DIAGNOSIS — F411 Generalized anxiety disorder: Secondary | ICD-10-CM | POA: Diagnosis not present

## 2022-01-04 DIAGNOSIS — F429 Obsessive-compulsive disorder, unspecified: Secondary | ICD-10-CM | POA: Diagnosis not present

## 2022-01-04 DIAGNOSIS — F209 Schizophrenia, unspecified: Secondary | ICD-10-CM | POA: Diagnosis not present

## 2022-01-04 DIAGNOSIS — E889 Metabolic disorder, unspecified: Secondary | ICD-10-CM | POA: Diagnosis not present

## 2022-01-04 DIAGNOSIS — F70 Mild intellectual disabilities: Secondary | ICD-10-CM | POA: Diagnosis not present

## 2022-01-26 DIAGNOSIS — F70 Mild intellectual disabilities: Secondary | ICD-10-CM | POA: Diagnosis not present

## 2022-01-26 DIAGNOSIS — F411 Generalized anxiety disorder: Secondary | ICD-10-CM | POA: Diagnosis not present

## 2022-01-26 DIAGNOSIS — F4010 Social phobia, unspecified: Secondary | ICD-10-CM | POA: Diagnosis not present

## 2022-01-26 DIAGNOSIS — F209 Schizophrenia, unspecified: Secondary | ICD-10-CM | POA: Diagnosis not present

## 2022-01-26 DIAGNOSIS — F429 Obsessive-compulsive disorder, unspecified: Secondary | ICD-10-CM | POA: Diagnosis not present

## 2022-02-04 IMAGING — DX DG ABDOMEN 1V
1 series · 2 of 2 positions shown · non-contrast
Comparison: None.

CLINICAL DATA: Seizure

EXAM:
ABDOMEN - 1 VIEW

[Series 1: abdomen · 0.14mm/px · 2 of 2 slices shown]
[im 1/2]
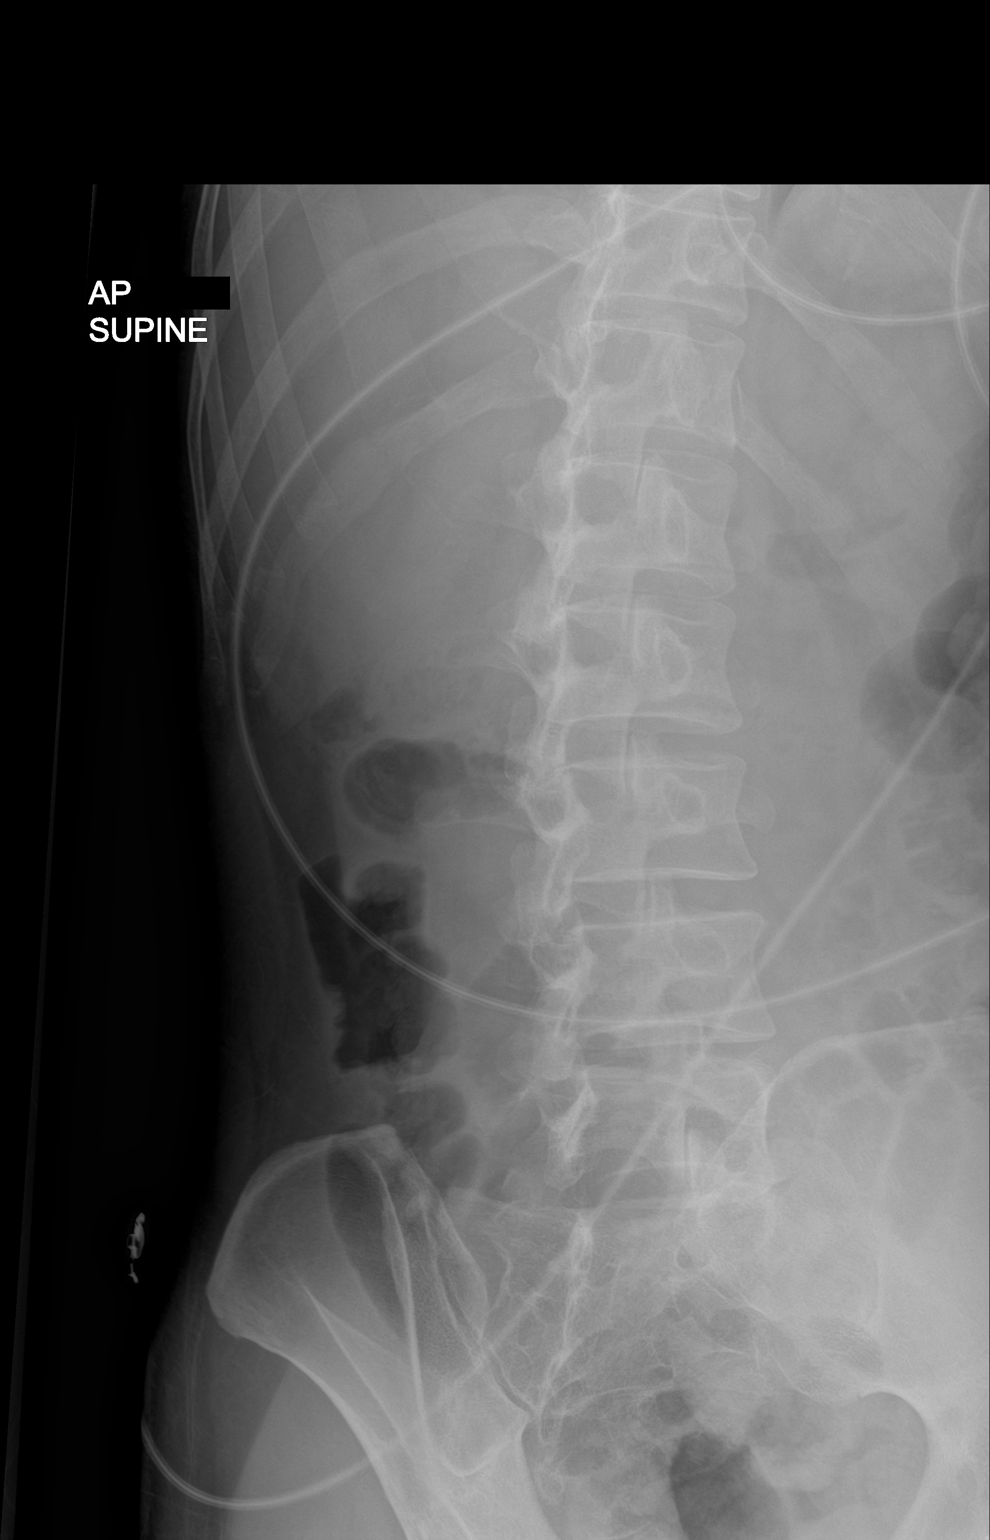
[im 2/2]
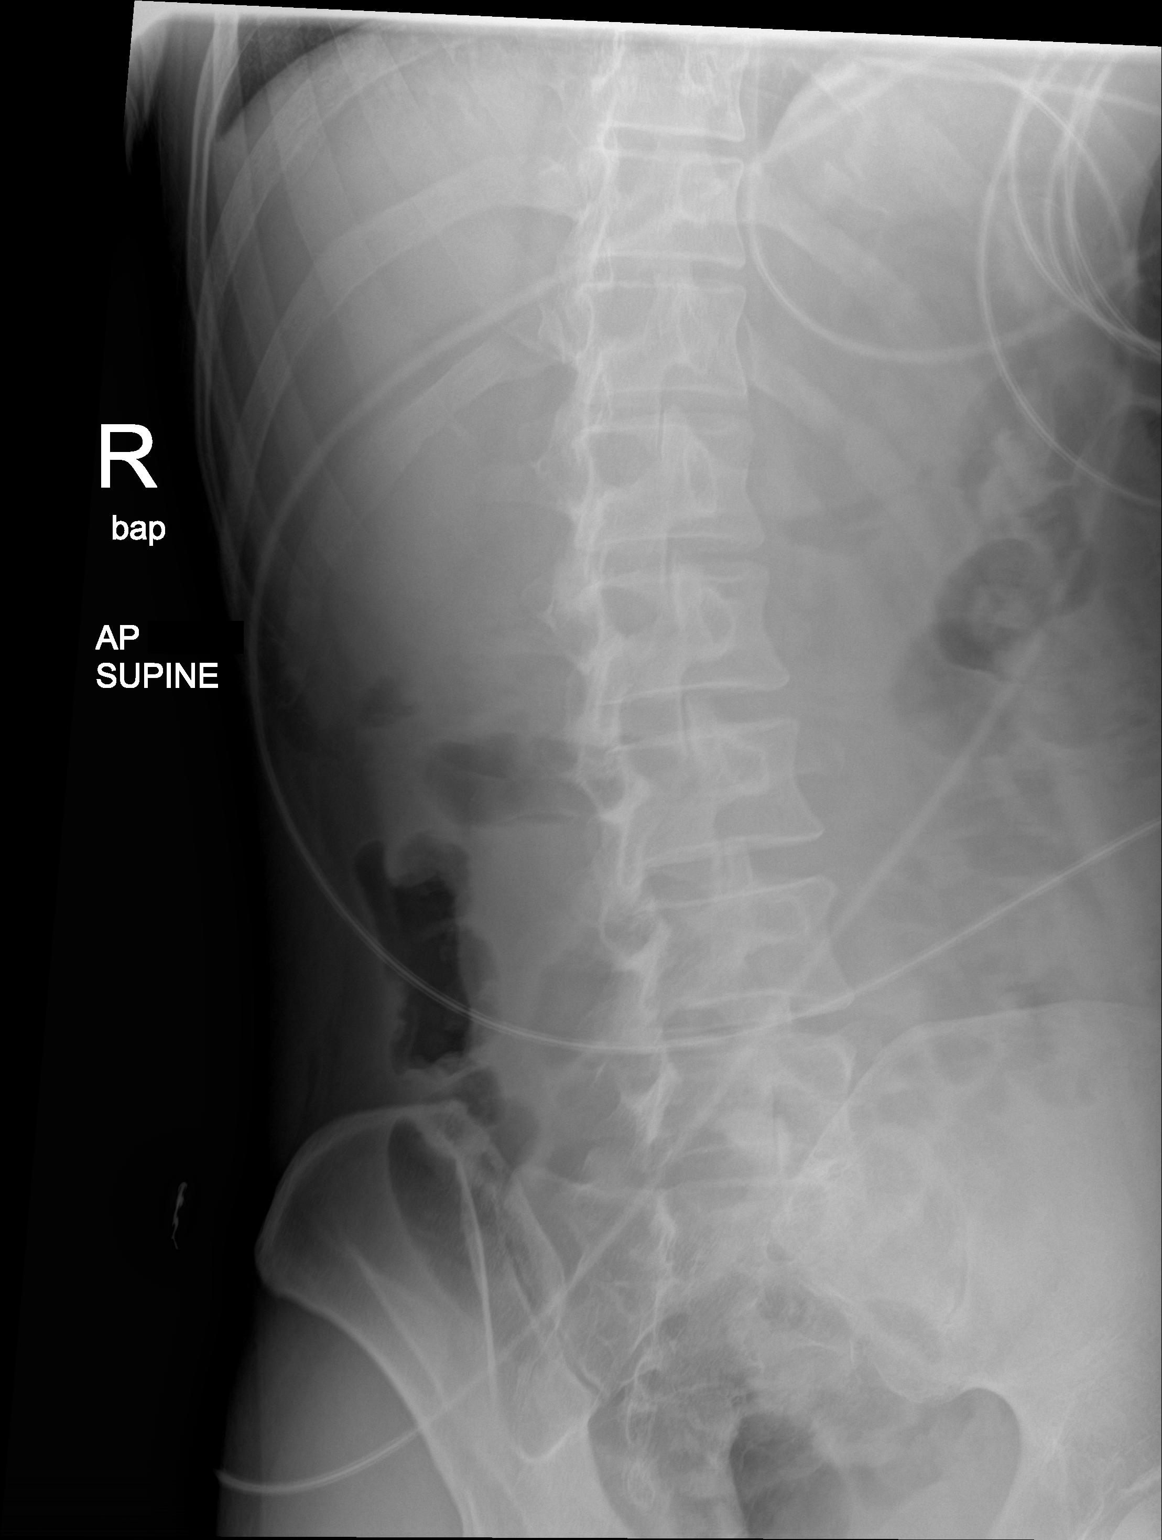

[2 of 2 positions shown; findings below may reference images not displayed]

FINDINGS: Nonobstructive bowel gas pattern. No organomegaly or free air
visualized. Study limited by patient motion and rotation.
IMPRESSION: No acute findings.

## 2022-02-04 IMAGING — DX DG PORTABLE PELVIS
1 series · 2 of 2 positions shown · non-contrast
Comparison: None.

CLINICAL DATA: Seizure

EXAM:
PORTABLE PELVIS 1-2 VIEWS

[Series 1: pelvis · 0.14mm/px · 2 of 2 slices shown]
[im 1/2]
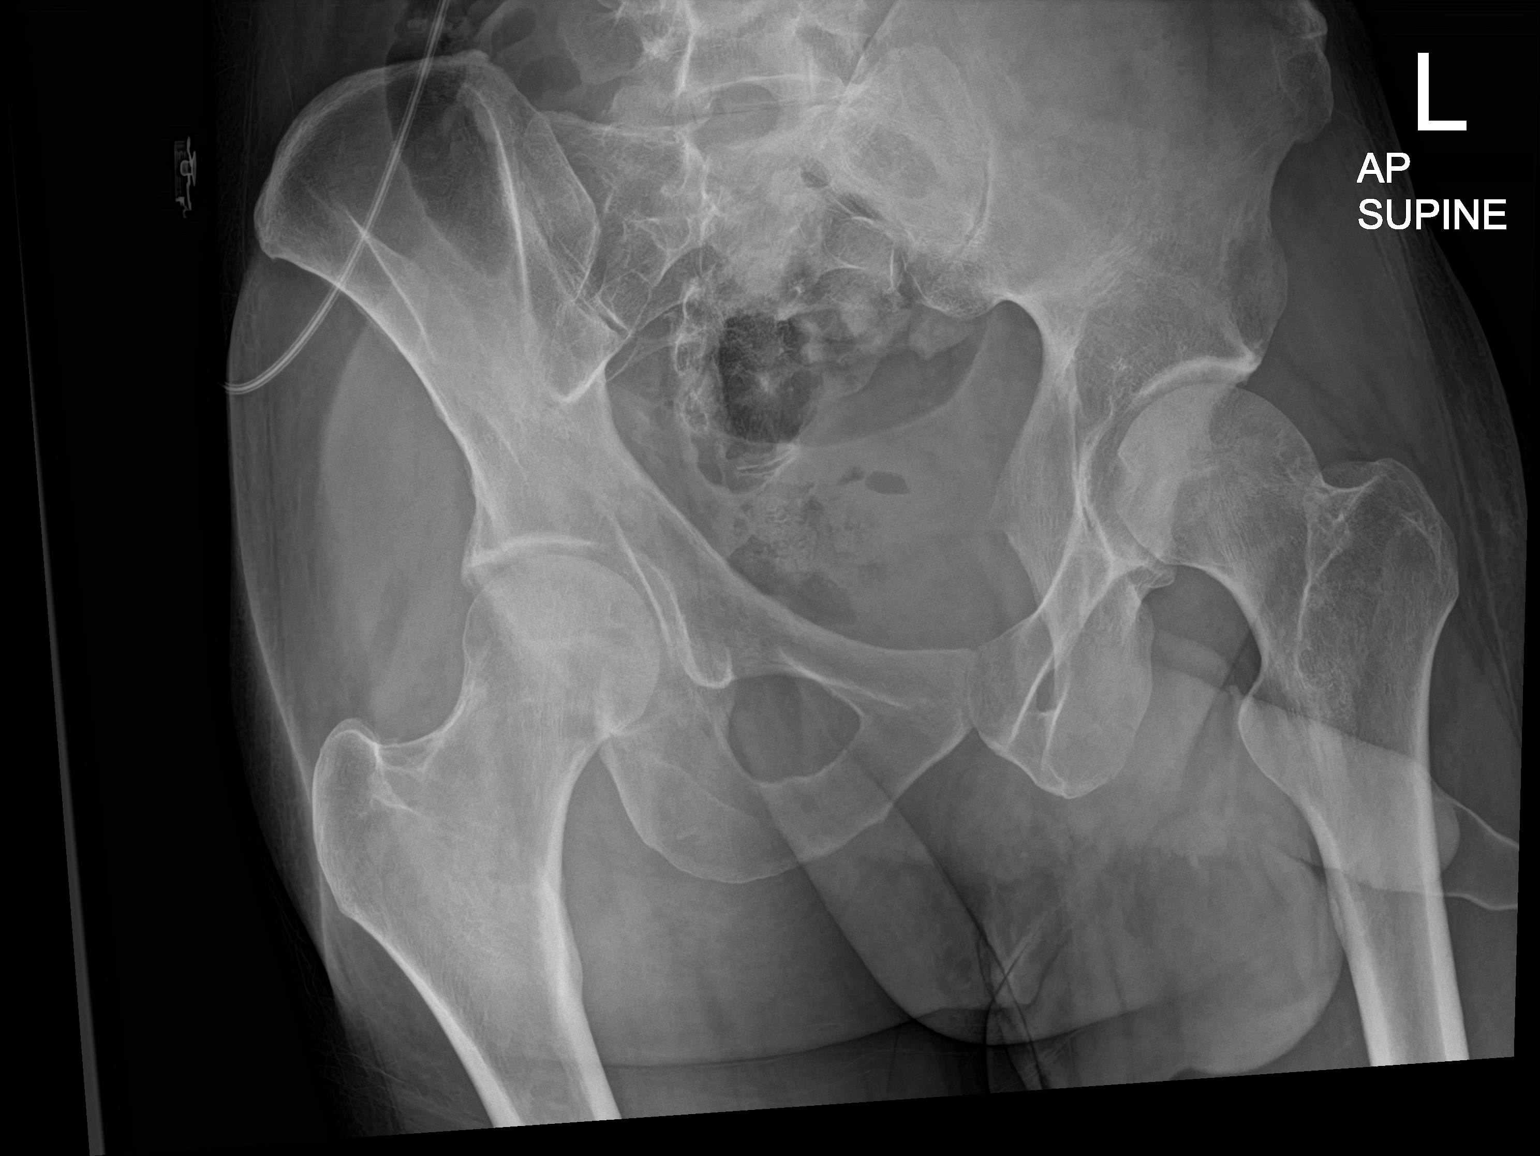
[im 2/2]
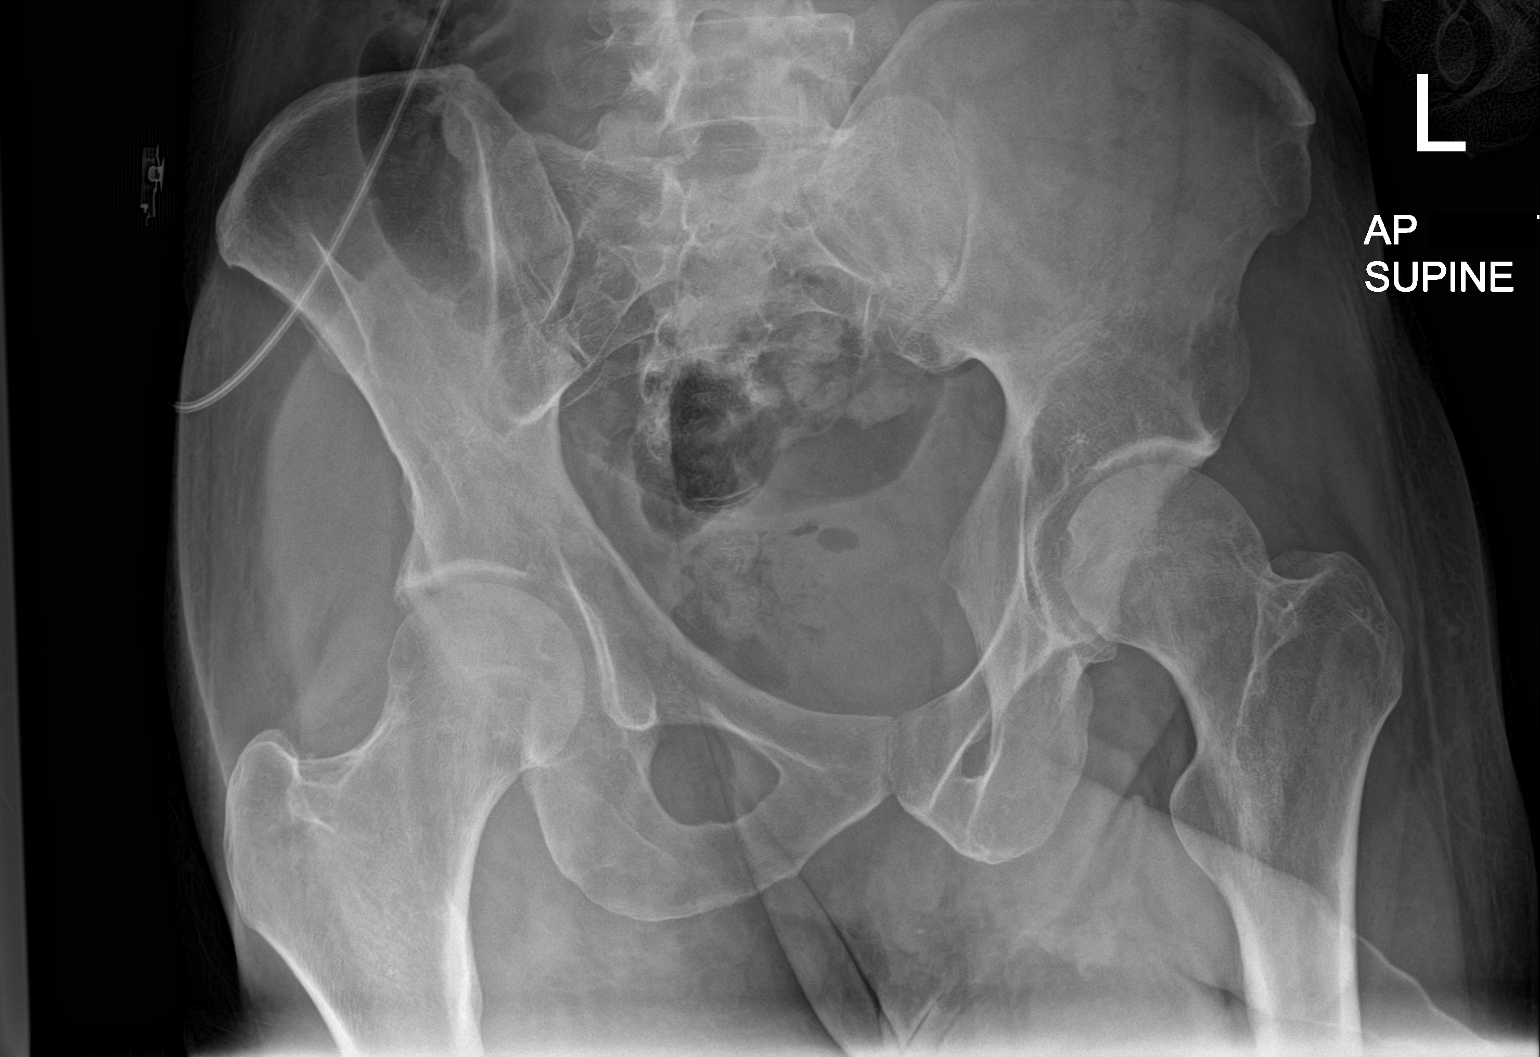

[2 of 2 positions shown; findings below may reference images not displayed]

FINDINGS: Hip joints and SI joints symmetric and unremarkable. No acute bony
abnormality. Specifically, no fracture, subluxation, or dislocation.
IMPRESSION: No acute bony abnormality.

## 2022-02-05 IMAGING — MR MR HEAD W/O CM
14 of 15 series · 42 of 48 positions shown · non-contrast
Comparison: Prior CT from 12/06/2019.

CLINICAL DATA: Initial evaluation for acute seizure.

EXAM:
MRI HEAD WITHOUT CONTRAST
TECHNIQUE: Multiplanar, multiecho pulse sequences of the brain and surrounding
structures were obtained without intravenous contrast.

[Series 5: DWI · axial · 3.0mm · 0.88mm/px · z∈[-13,+117]mm · 6 of 100 slices shown (1 of 4)]
[im 1/100]
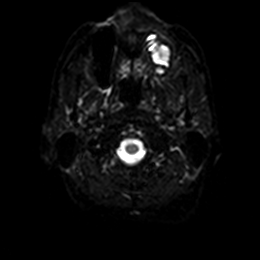
[im 20/100]
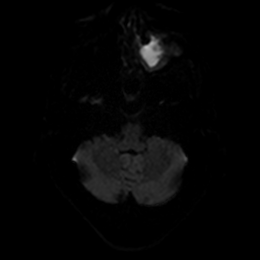
[im 40/100]
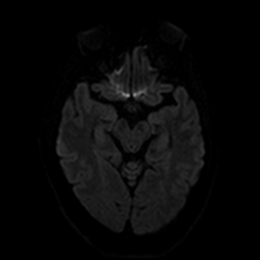
[im 60/100]
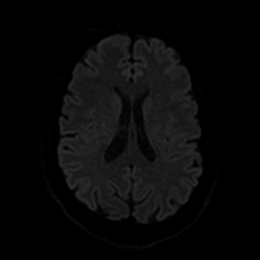
[im 80/100]
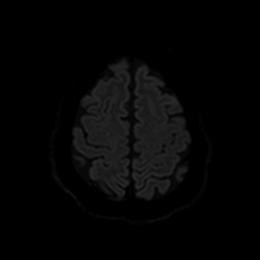
[im 100/100]
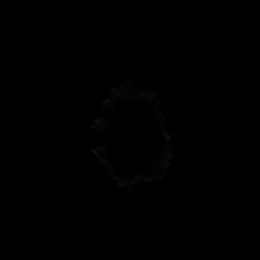

[Series 6: DWI · axial · 3.0mm · 0.88mm/px · z∈[-13,+117]mm · 4 of 50 slices shown (2 of 4)]
[im 1/50]
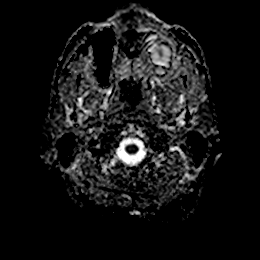
[im 17/50]
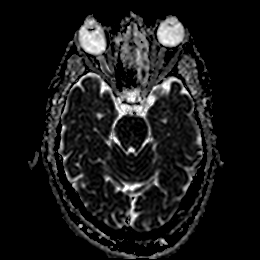
[im 33/50]
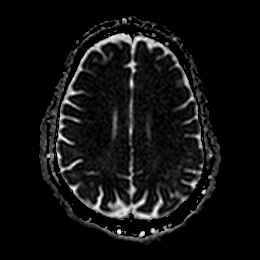
[im 50/50]
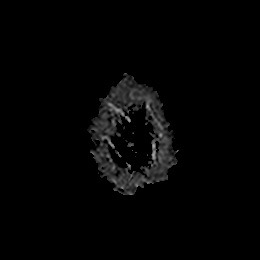

[Series 7: DWI · coronal · 4.0mm · 0.88mm/px · 5 of 70 slices shown (3 of 4)]
[im 1/70]
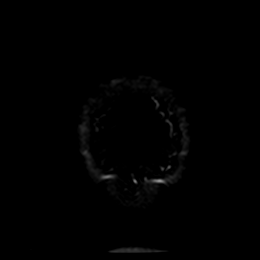
[im 18/70]
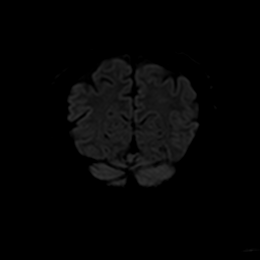
[im 35/70]
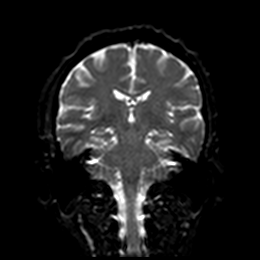
[im 52/70]
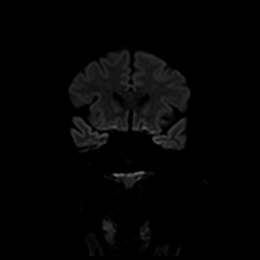
[im 70/70]
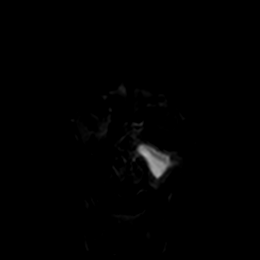

[Series 8: DWI · coronal · 4.0mm · 0.88mm/px · 2 of 35 slices shown (4 of 4)]
[im 1/35]
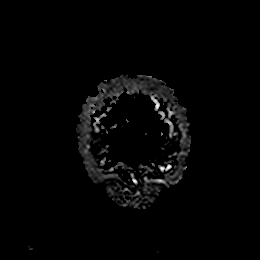
[im 35/35]
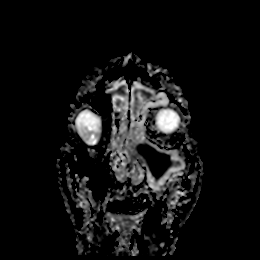

[Series 9: T1 · sagittal · 5.0mm · 0.75mm/px · 2 of 26 slices shown]
[im 1/26]
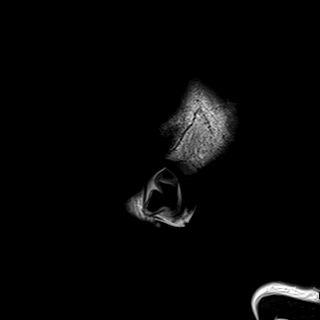
[im 26/26]
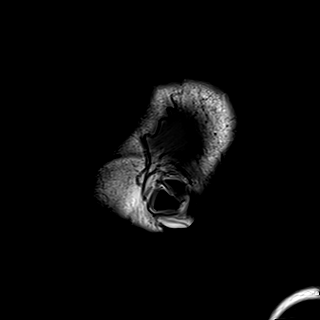

[Series 10: T2 · axial · 5.0mm · 0.72mm/px · z∈[-17,+121]mm · 2 of 27 slices shown (1 of 3)]
[im 1/27]
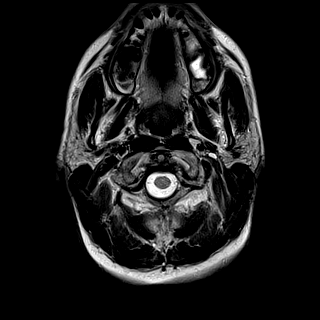
[im 27/27]
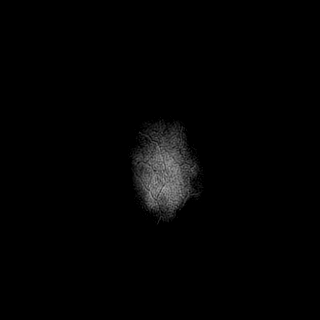

[Series 11: FLAIR · axial · 5.0mm · 0.45mm/px · z∈[-18,+120]mm · 2 of 27 slices shown (1 of 2)]
[im 1/27]
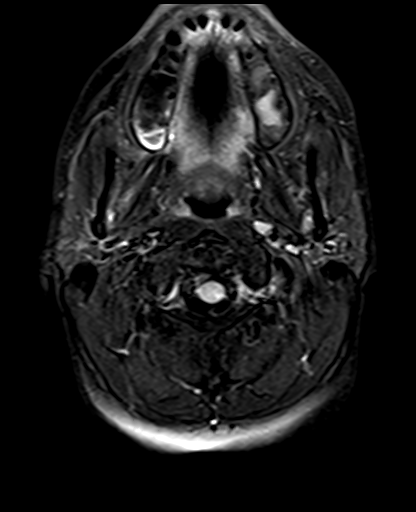
[im 27/27]
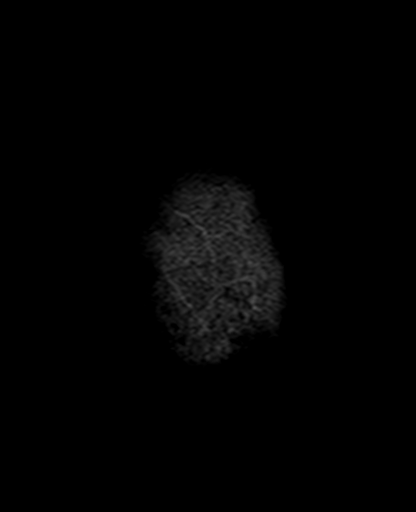

[Series 12: mag_images · axial · 3.0mm · 0.90mm/px · z∈[-16,+119]mm · 4 of 52 slices shown]
[im 1/52]
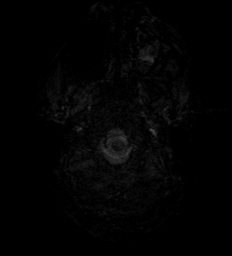
[im 18/52]
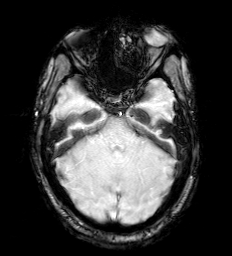
[im 35/52]
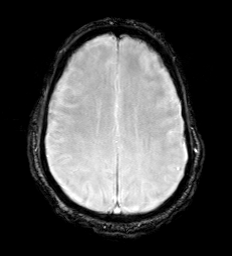
[im 52/52]
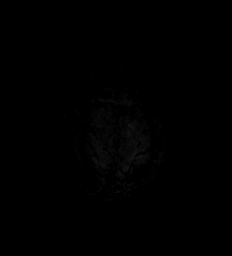

[Series 13: pha_images · axial · 3.0mm · 0.90mm/px · z∈[-16,+119]mm · 4 of 52 slices shown]
[im 1/52]
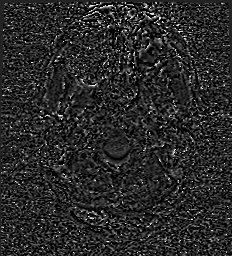
[im 18/52]
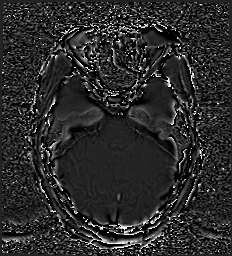
[im 35/52]
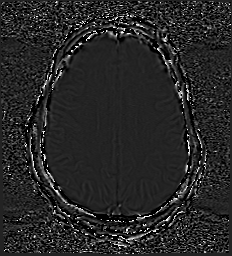
[im 52/52]
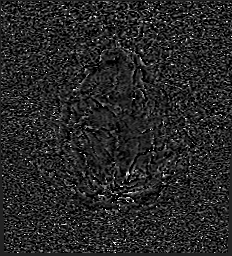

[Series 14: swi_images · axial · 3.0mm · 0.90mm/px · z∈[-16,+119]mm · 4 of 52 slices shown]
[im 1/52]
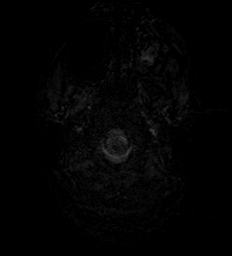
[im 18/52]
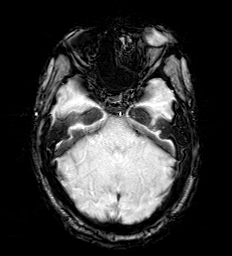
[im 35/52]
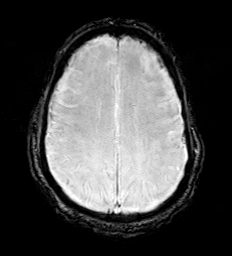
[im 52/52]
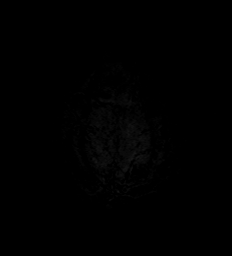

[Series 15: mip_images(sw) · axial · 24.0mm · 0.90mm/px · 1 of 45 slices shown]
[im 1/45]
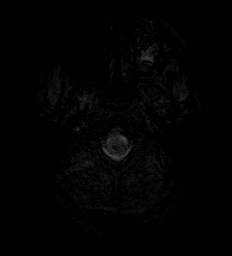

[Series 17: T2 · coronal · 3.0mm · 0.27mm/px · 2 of 32 slices shown (2 of 3)]
[im 1/32]
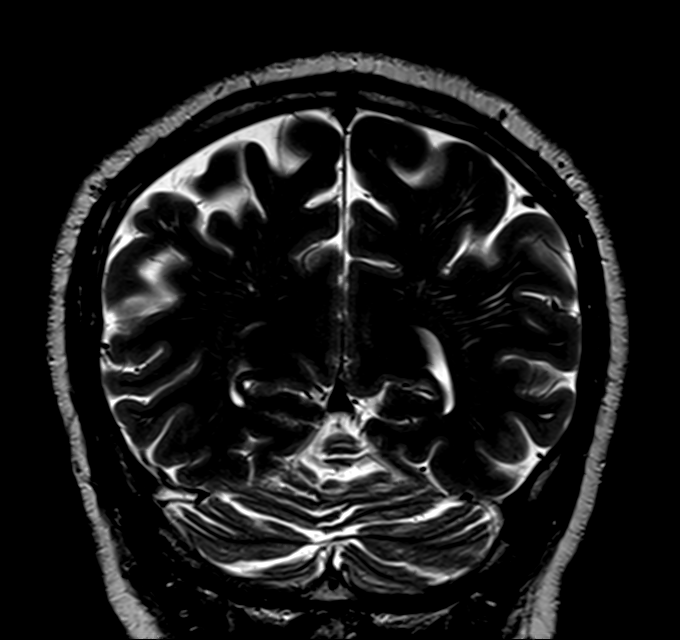
[im 32/32]
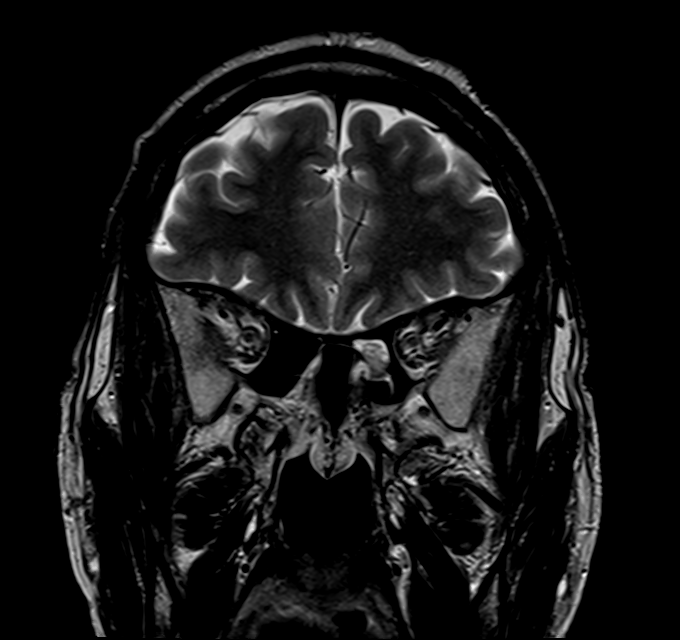

[Series 18: FLAIR · coronal · 3.0mm · 0.56mm/px · 2 of 32 slices shown (2 of 2)]
[im 1/32]
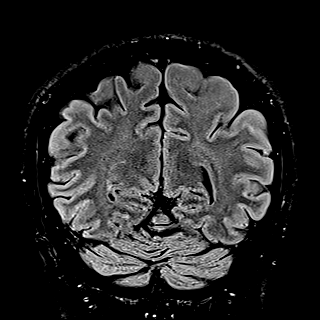
[im 32/32]
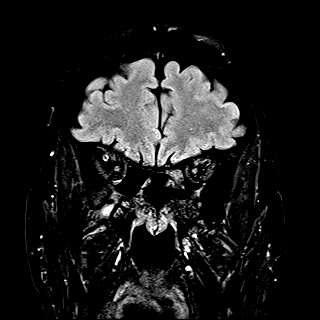

[Series 19: T2 · coronal · 5.0mm · 0.34mm/px · 2 of 30 slices shown (3 of 3)]
[im 1/30]
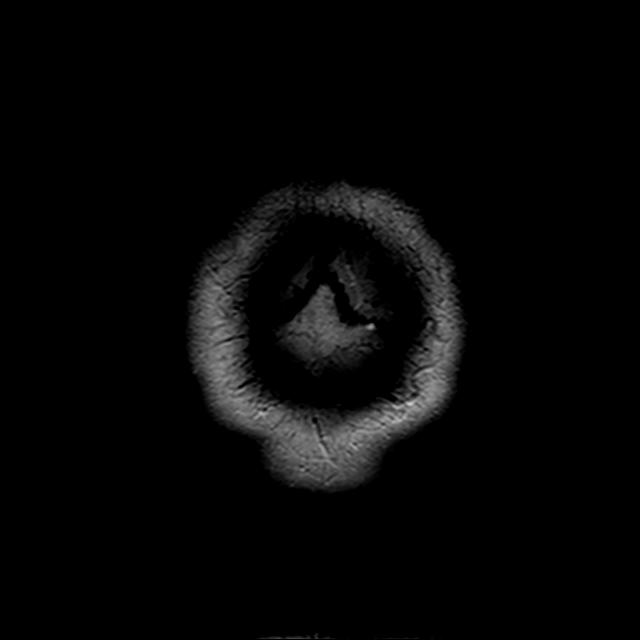
[im 30/30]
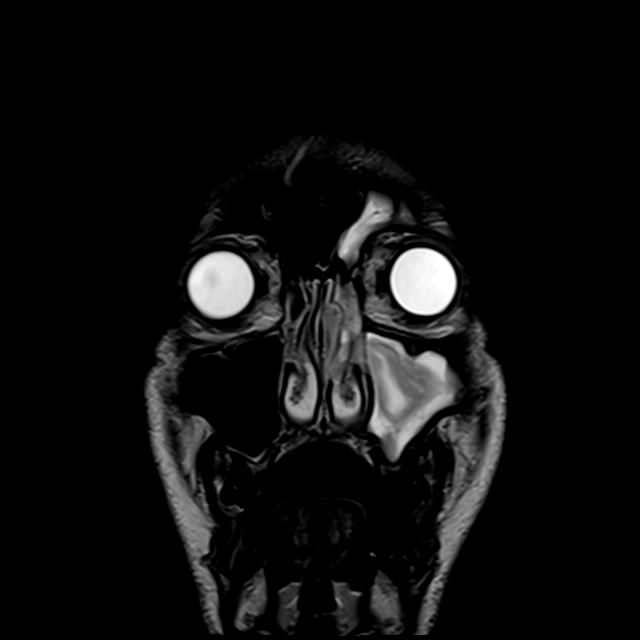

[42 of 48 positions shown; findings below may reference images not displayed]

FINDINGS: Brain: Cerebral volume within normal limits for age. No focal
parenchymal signal abnormality. No abnormal foci of restricted to
suggest acute or subacute ischemia or changes related to seizure.
Gray-white matter differentiation maintained. No encephalomalacia
gliosis to suggest chronic cortical infarction or other insult. No
foci of susceptibility artifact to suggest acute or chronic
intracranial hemorrhage.

No mass lesion, midline shift or mass effect. No hydrocephalus or
extra-axial fluid collection. Pituitary gland suprasellar region
normal. Midline structures intact. No intrinsic temporal lobe
abnormality.

Vascular: Major intracranial vascular flow voids are well
maintained.

Skull and upper cervical spine: Craniocervical junction within
normal limits. Bone marrow signal intensity normal. No scalp soft
tissue abnormality.

Sinuses/Orbits: Globes and orbital soft tissues within normal
limits. Extensive left frontal, ethmoidal, and maxillary sinusitis,
chronic in appearance. No mastoid effusion. Inner ear structures
within normal limits.

Other: None.
IMPRESSION: 1. Normal brain MRI. No acute intracranial abnormality identified.
2. Extensive left-sided paranasal sinusitis, chronic in appearance.

## 2022-02-07 ENCOUNTER — Other Ambulatory Visit: Payer: Self-pay | Admitting: Internal Medicine

## 2022-02-07 DIAGNOSIS — Z131 Encounter for screening for diabetes mellitus: Secondary | ICD-10-CM | POA: Diagnosis not present

## 2022-02-07 DIAGNOSIS — Z0000 Encounter for general adult medical examination without abnormal findings: Secondary | ICD-10-CM | POA: Diagnosis not present

## 2022-02-07 DIAGNOSIS — F2089 Other schizophrenia: Secondary | ICD-10-CM | POA: Diagnosis not present

## 2022-02-07 DIAGNOSIS — E559 Vitamin D deficiency, unspecified: Secondary | ICD-10-CM | POA: Diagnosis not present

## 2022-02-07 DIAGNOSIS — Z13220 Encounter for screening for lipoid disorders: Secondary | ICD-10-CM | POA: Diagnosis not present

## 2022-02-07 DIAGNOSIS — Z125 Encounter for screening for malignant neoplasm of prostate: Secondary | ICD-10-CM | POA: Diagnosis not present

## 2022-02-08 LAB — COMPLETE METABOLIC PANEL WITH GFR
AG Ratio: 1.4 (calc) (ref 1.0–2.5)
ALT: 22 U/L (ref 9–46)
AST: 17 U/L (ref 10–40)
Albumin: 4 g/dL (ref 3.6–5.1)
Alkaline phosphatase (APISO): 73 U/L (ref 36–130)
BUN: 16 mg/dL (ref 7–25)
CO2: 21 mmol/L (ref 20–32)
Calcium: 8.9 mg/dL (ref 8.6–10.3)
Chloride: 105 mmol/L (ref 98–110)
Creat: 1.01 mg/dL (ref 0.60–1.29)
Globulin: 2.8 g/dL (calc) (ref 1.9–3.7)
Glucose, Bld: 110 mg/dL — ABNORMAL HIGH (ref 65–99)
Potassium: 4 mmol/L (ref 3.5–5.3)
Sodium: 139 mmol/L (ref 135–146)
Total Bilirubin: 0.3 mg/dL (ref 0.2–1.2)
Total Protein: 6.8 g/dL (ref 6.1–8.1)
eGFR: 94 mL/min/{1.73_m2} (ref >=60)

## 2022-02-08 LAB — VITAMIN D 25 HYDROXY (VIT D DEFICIENCY, FRACTURES): Vit D, 25-Hydroxy: 7 ng/mL — ABNORMAL LOW (ref 30–100)

## 2022-02-08 LAB — TSH: TSH: 1.61 mIU/L (ref 0.40–4.50)

## 2022-02-08 LAB — CBC
HCT: 35.4 % — ABNORMAL LOW (ref 38.5–50.0)
Hemoglobin: 11.9 g/dL — ABNORMAL LOW (ref 13.2–17.1)
MCH: 28.2 pg (ref 27.0–33.0)
MCHC: 33.6 g/dL (ref 32.0–36.0)
MCV: 83.9 fL (ref 80.0–100.0)
MPV: 10.6 fL (ref 7.5–12.5)
Platelets: 213 10*3/uL (ref 140–400)
RBC: 4.22 10*6/uL (ref 4.20–5.80)
RDW: 13.3 % (ref 11.0–15.0)
WBC: 5.1 10*3/uL (ref 3.8–10.8)

## 2022-02-08 LAB — LIPID PANEL
Cholesterol: 151 mg/dL (ref <200)
HDL: 39 mg/dL — ABNORMAL LOW (ref >=40)
LDL Cholesterol (Calc): 83 mg/dL (calc)
Non-HDL Cholesterol (Calc): 112 mg/dL (calc) (ref <130)
Total CHOL/HDL Ratio: 3.9 (calc) (ref <5.0)
Triglycerides: 193 mg/dL — ABNORMAL HIGH (ref <150)

## 2022-02-08 LAB — PSA: PSA: 0.34 ng/mL (ref <=4.00)

## 2022-02-21 DIAGNOSIS — K5909 Other constipation: Secondary | ICD-10-CM | POA: Diagnosis not present

## 2022-02-21 DIAGNOSIS — F2089 Other schizophrenia: Secondary | ICD-10-CM | POA: Diagnosis not present

## 2022-02-24 DIAGNOSIS — F429 Obsessive-compulsive disorder, unspecified: Secondary | ICD-10-CM | POA: Diagnosis not present

## 2022-02-24 DIAGNOSIS — F209 Schizophrenia, unspecified: Secondary | ICD-10-CM | POA: Diagnosis not present

## 2022-02-24 DIAGNOSIS — F411 Generalized anxiety disorder: Secondary | ICD-10-CM | POA: Diagnosis not present

## 2022-02-24 DIAGNOSIS — F4010 Social phobia, unspecified: Secondary | ICD-10-CM | POA: Diagnosis not present

## 2022-02-24 DIAGNOSIS — F70 Mild intellectual disabilities: Secondary | ICD-10-CM | POA: Diagnosis not present

## 2022-03-01 DIAGNOSIS — K5909 Other constipation: Secondary | ICD-10-CM | POA: Diagnosis not present

## 2022-03-01 DIAGNOSIS — Z23 Encounter for immunization: Secondary | ICD-10-CM | POA: Diagnosis not present

## 2022-03-01 DIAGNOSIS — L259 Unspecified contact dermatitis, unspecified cause: Secondary | ICD-10-CM | POA: Diagnosis not present

## 2022-03-01 DIAGNOSIS — F2089 Other schizophrenia: Secondary | ICD-10-CM | POA: Diagnosis not present

## 2022-03-01 DIAGNOSIS — Z111 Encounter for screening for respiratory tuberculosis: Secondary | ICD-10-CM | POA: Diagnosis not present

## 2022-03-04 DIAGNOSIS — F4010 Social phobia, unspecified: Secondary | ICD-10-CM | POA: Diagnosis not present

## 2022-03-04 DIAGNOSIS — F409 Phobic anxiety disorder, unspecified: Secondary | ICD-10-CM | POA: Diagnosis not present

## 2022-03-04 DIAGNOSIS — F411 Generalized anxiety disorder: Secondary | ICD-10-CM | POA: Diagnosis not present

## 2022-03-04 DIAGNOSIS — F429 Obsessive-compulsive disorder, unspecified: Secondary | ICD-10-CM | POA: Diagnosis not present

## 2022-03-08 ENCOUNTER — Other Ambulatory Visit: Payer: Self-pay

## 2022-03-08 ENCOUNTER — Emergency Department: Payer: Medicare HMO

## 2022-03-08 ENCOUNTER — Emergency Department
Admission: EM | Admit: 2022-03-08 | Discharge: 2022-03-08 | Disposition: A | Discharge status: Home / Self Care | Payer: Medicare HMO | Attending: Emergency Medicine | Admitting: Emergency Medicine

## 2022-03-08 DIAGNOSIS — R569 Unspecified convulsions: Secondary | ICD-10-CM | POA: Diagnosis not present

## 2022-03-08 DIAGNOSIS — F29 Unspecified psychosis not due to a substance or known physiological condition: Secondary | ICD-10-CM | POA: Diagnosis not present

## 2022-03-08 DIAGNOSIS — R4182 Altered mental status, unspecified: Secondary | ICD-10-CM | POA: Diagnosis not present

## 2022-03-08 DIAGNOSIS — F209 Schizophrenia, unspecified: Secondary | ICD-10-CM | POA: Diagnosis not present

## 2022-03-08 DIAGNOSIS — R000 Tachycardia, unspecified: Secondary | ICD-10-CM | POA: Diagnosis not present

## 2022-03-08 DIAGNOSIS — R456 Violent behavior: Secondary | ICD-10-CM | POA: Diagnosis not present

## 2022-03-08 DIAGNOSIS — F203 Undifferentiated schizophrenia: Secondary | ICD-10-CM | POA: Diagnosis present

## 2022-03-08 DIAGNOSIS — R4689 Other symptoms and signs involving appearance and behavior: Secondary | ICD-10-CM | POA: Diagnosis present

## 2022-03-08 LAB — CBC
HCT: 40 % (ref 39.0–52.0)
Hemoglobin: 13 g/dL (ref 13.0–17.0)
MCH: 27.5 pg (ref 26.0–34.0)
MCHC: 32.5 g/dL (ref 30.0–36.0)
MCV: 84.7 fL (ref 80.0–100.0)
Platelets: 219 10*3/uL (ref 150–400)
RBC: 4.72 MIL/uL (ref 4.22–5.81)
RDW: 12.2 % (ref 11.5–15.5)
WBC: 6.9 10*3/uL (ref 4.0–10.5)
nRBC: 0 % (ref 0.0–0.2)

## 2022-03-08 LAB — URINE DRUG SCREEN, QUALITATIVE (ARMC ONLY)
Amphetamines, Ur Screen: NOT DETECTED
Barbiturates, Ur Screen: NOT DETECTED
Benzodiazepine, Ur Scrn: NOT DETECTED
Cannabinoid 50 Ng, Ur ~~LOC~~: NOT DETECTED
Cocaine Metabolite,Ur ~~LOC~~: NOT DETECTED
MDMA (Ecstasy)Ur Screen: NOT DETECTED
Methadone Scn, Ur: NOT DETECTED
Opiate, Ur Screen: NOT DETECTED
Phencyclidine (PCP) Ur S: NOT DETECTED
Tricyclic, Ur Screen: POSITIVE — AB

## 2022-03-08 LAB — COMPREHENSIVE METABOLIC PANEL
ALT: 22 U/L (ref 0–44)
AST: 23 U/L (ref 15–41)
Albumin: 4.1 g/dL (ref 3.5–5.0)
Alkaline Phosphatase: 74 U/L (ref 38–126)
Anion gap: 11 (ref 5–15)
BUN: 20 mg/dL (ref 6–20)
CO2: 22 mmol/L (ref 22–32)
Calcium: 9.1 mg/dL (ref 8.9–10.3)
Chloride: 105 mmol/L (ref 98–111)
Creatinine, Ser: 1.32 mg/dL — ABNORMAL HIGH (ref 0.61–1.24)
GFR, Estimated: >60 mL/min (ref >60)
Glucose, Bld: 99 mg/dL (ref 70–99)
Potassium: 3.8 mmol/L (ref 3.5–5.1)
Sodium: 138 mmol/L (ref 135–145)
Total Bilirubin: 0.7 mg/dL (ref 0.3–1.2)
Total Protein: 7.5 g/dL (ref 6.5–8.1)

## 2022-03-08 NOTE — ED Notes (Signed)
Pt c/c; in NAD.

## 2022-03-08 NOTE — ED Provider Notes (Signed)
[facility] Provider Note    Event Date/Time   First MD Initiated Contact with Patient 03/08/22 1824     (approximate)   History   Altered Mental Status   HPI  Brian Stafford is a 47 y.o. male past medical history of schizophrenia, bipolar disorder and anxiety, seizure disorder who presents with altered mental status.  Patient was sent from his facility because his not been acting like himself.  Patient tells me he is here because he is "sick".  Spoke with his dad who tells me that he is at a relatively new group home and that he has episodes where he becomes less responsive but that this is normal for him and that if he is just left alone that he typically will come out of it.    Apparently patient told triage nurse that he is having back pain.  He is denying any specific complaints to me currently.  Says that he would like to stay here.  Past Medical History:  Diagnosis Date   Anxiety    Bipolar 1 disorder ([place])    Mental retardation    Schizophrenia ([place])     Patient Active Problem List   Diagnosis Date Noted   New onset seizure ([place]) 62/69/4854   Acute metabolic encephalopathy 62/70/3500   Suicidal ideation 12/12/2019   Dehydration 12/12/2019   Seizure ([place]) 12/06/2019   Schizophrenia ([place]) 09/08/2019   Aggression 09/08/2019     Physical Exam  Triage Vital Signs: ED Triage Vitals  Enc Vitals Group     BP 03/08/22 1759 132/80     Pulse Rate 03/08/22 1759 (!) 130     Resp 03/08/22 1759 20     Temp 03/08/22 1759 99.1 F (37.3 C)     Temp src --      SpO2 03/08/22 1759 99 %     Weight --      Height --      Head Circumference --      Peak Flow --      Pain Score 03/08/22 1757 5     Pain Loc --      Pain Edu? --      Excl. in [place]? --     Most recent vital signs: Vitals:   03/08/22 2317 03/08/22 2320  BP: 124/76   Pulse: 91   Resp:  20  Temp: 98.7 F (37.1 C)   SpO2: 100%      General: Awake, no distress.  Appears  comfortable CV:  Good peripheral perfusion.  Resp:  Normal effort.  Abd:  No distention.  Neuro:             Awake, Alert, Oriented x 3  Other:  Affect is flat, minimal speech, does not appear to be reacting to internal stimuli, moves all extremities spontaneously   ED Results / Procedures / Treatments  Labs (all labs ordered are listed, but only abnormal results are displayed) Labs Reviewed  COMPREHENSIVE METABOLIC PANEL - Abnormal; Notable for the following components:      Result Value   Creatinine, Ser 1.32 (*)    All other components within normal limits  URINE DRUG SCREEN, QUALITATIVE ([facility] ONLY) - Abnormal; Notable for the following components:   Tricyclic, Ur Screen POSITIVE (*)    All other components within normal limits  CBC  CBG MONITORING, ED     EKG  EKG reviewed interpreted myself shows sinus tachycardia normal axis normal intervals LVH no acute ischemic  changes   RADIOLOGY I reviewed and interpreted the CT scan of the brain which does not show any acute intracranial process    PROCEDURES:  Critical Care performed: No  Procedures    MEDICATIONS ORDERED IN ED: Medications - No data to display   IMPRESSION / MDM / [place] / ED COURSE  I reviewed the triage vital signs and the nursing notes.                              Patient's presentation is most consistent with acute, uncomplicated illness.  Differential diagnosis includes, but is not limited to, acute psychosis, intoxication, withdrawal, electrode normality, seizure, postictal state, hypoglycemia, intracranial hemorrhage, less likely meningitis/encephalitis  The patient is a 47 year old male with underlying schizophrenia who presents because his group home felt he was altered.  Unclear exactly what they were concerned about.  When I spoke with patient's dad tells me that he has episodes where he is less responsive but this is normal for him.  Patient told triage she is having back  pain but he is denying any complaints currently.  Does tell me he is here because he is "sick" but will not specify.  He looks comfortable moves all extremities he is alert and oriented x 3.  Vital signs are reassuring.  Obtain CBC and BMP as well as CT of the head given concern for altered mental status and this is all reassuring.  Question whether patient has some decompensation of his underlying psychiatric disease.  I did involve psychiatry and they cleared him from a psychiatric standpoint.  They did speak with who, presented to be says that they were not concerned about this behavior so somewhat unclear why he was sent to ED.   At this point I think that patient is likely at his baseline and I have low suspicion for acute life-threatening medical process.  Will discharge back to group home.   FINAL CLINICAL IMPRESSION(S) / ED DIAGNOSES   Final diagnoses:  Schizophrenia, unspecified type ([place])     Rx / DC Orders   ED Discharge Orders     None        Note:  This document was prepared using Dragon voice recognition software and may include unintentional dictation errors.   Rada Hay, MD 03/08/22 [phone]

## 2022-03-08 NOTE — ED Notes (Signed)
Called psych team to confirm that they are d/c pt as [place] called by charge RN Lorriane Shire in order to find out about possible routes of transportation since don't have a family member's phone number in chart or on paperwork and [facility] stating concerns and stated they were "told pt is being admitted"; psych staff confirms they talked with [facility] but states they never told [facility] staff that pt being admitted. EDP [facility] aware that psych team cleared pt for d/c.

## 2022-03-08 NOTE — Discharge Instructions (Signed)
You are seen by psychiatrist and cleared.  Your blood work and CAT scan of your head was reassuring.

## 2022-03-08 NOTE — BH Assessment (Signed)
Comprehensive Clinical Assessment (CCA) Note  03/08/2022 Brian Stafford [id number]  Chief Complaint: Patient is a 47 year old male presenting to [facility] ED voluntarily. Per triage note Pt comes via EMS from a group home more altered than normal. Unsure of baseline and what group home. Pt states he is at the hospital [facility]. Pt did state to charge that he was having back pain. During assessment patient appears alert and oriented x4, calm and cooperative. Patient reports that he would like to stay in the hospital, he denies any SI/HI/AH/VH. Collateral information was obtained from the patient's group home nurse who reports that "He's fine, this is all behavior, he has an injection coming up and I'm going to talk with his psychiatrist about adjusting his medications."   Per Psyc NP Ysidro Evert patient is psyc cleared Chief Complaint  Patient presents with   Altered Mental Status   Visit Diagnosis: Schizophrenia    CCA Screening, Triage and Referral (STR)  Patient Reported Information How did you hear about [place]? Self  Referral name: No data recorded Referral phone number: No data recorded  Whom do you see for routine medical problems? No data recorded Practice/Facility Name: No data recorded Practice/Facility Phone Number: No data recorded Name of Contact: No data recorded Contact Number: No data recorded Contact Fax Number: No data recorded Prescriber Name: No data recorded Prescriber Address (if known): No data recorded  What Is the Reason for Your Visit/Call Today? Pt comes via EMS from a group home more altered than normal. Unsure of baseline and what group home.  Pt states he is at the hospital [facility]. Pt did state to charge that he was having back pain  How Long Has This Been Causing You Problems? > than 6 months  What Do You Feel Would Help You the Most Today? No data recorded  Have You Recently Been in Any Inpatient Treatment (Hospital/Detox/Crisis  Center/28-Day Program)? No data recorded Name/Location of Program/Hospital:No data recorded How Long Were You There? No data recorded When Were You Discharged? No data recorded  Have You Ever Received Services From [facility] Before? No data recorded Who Do You See at [facility]? No data recorded  Have You Recently Had Any Thoughts About Hurting Yourself? No  Are You Planning to Commit Suicide/Harm Yourself At This time? No   Have you Recently Had Thoughts About [place]? No  Explanation: No data recorded  Have You Used Any Alcohol or Drugs in the Past 24 Hours? No  How Long Ago Did You Use Drugs or Alcohol? No data recorded What Did You Use and How Much? No data recorded  Do You Currently Have a Therapist/Psychiatrist? Yes  Name of Therapist/Psychiatrist: Unknown   Have You Been Recently Discharged From Any Office Practice or Programs? No  Explanation of Discharge From Practice/Program: No data recorded    CCA Screening Triage Referral Assessment Type of Contact: Face-to-Face  Is this Initial or Reassessment? No data recorded Date Telepsych consult ordered in [facility]:  No data recorded Time Telepsych consult ordered in [facility]:  No data recorded  Patient Reported Information Reviewed? No data recorded Patient Left Without Being Seen? No data recorded Reason for Not Completing Assessment: No data recorded  Collateral Involvement: No data recorded  Does Patient Have a [place]? No data recorded Name and Contact of Legal Guardian: No data recorded If Minor and Not Living with Parent(s), Who has Custody? No data recorded Is CPS involved or ever  been involved? Never  Is APS involved or ever been involved? Never   Patient Determined To Be At Risk for Harm To Self or Others Based on Review of Patient Reported Information or Presenting Complaint? No  Method: No data recorded Availability of Means: No data recorded Intent: No data  recorded Notification Required: No data recorded Additional Information for Danger to Others Potential: No data recorded Additional Comments for Danger to Others Potential: No data recorded Are There Guns or Other Weapons in Your Home? No  Types of Guns/Weapons: No data recorded Are These Weapons Safely Secured?                            No data recorded Who Could Verify You Are Able To Have These Secured: No data recorded Do You Have any Outstanding Charges, Pending Court Dates, Parole/Probation? No data recorded Contacted To Inform of Risk of Harm To Self or Others: No data recorded  Location of Assessment: [facility] ED   Does Patient Present under Involuntary Commitment? No  IVC Papers Initial File Date: No data recorded  [place] of Residence: [place]   Patient Currently Receiving the Following Services: Medication Management; Group Home   Determination of Need: Emergent (2 hours)   Options For Referral: No data recorded    CCA Biopsychosocial Intake/Chief Complaint:  No data recorded Current Symptoms/Problems: No data recorded  Patient Reported Schizophrenia/Schizoaffective Diagnosis in Past: Yes   Strengths: Patient is able to communicate  Preferences: No data recorded Abilities: No data recorded  Type of Services Patient Feels are Needed: No data recorded  Initial Clinical Notes/Concerns: No data recorded  Mental Health Symptoms Depression:   Difficulty Concentrating; Fatigue   Duration of Depressive symptoms:  Greater than two weeks   Mania:   None   Anxiety:    None   Psychosis:   None   Duration of Psychotic symptoms: No data recorded  Trauma:   None   Obsessions:   None   Compulsions:   None   Inattention:   None   Hyperactivity/Impulsivity:   None   Oppositional/Defiant Behaviors:   None   Emotional Irregularity:   None   Other Mood/Personality Symptoms:  No data recorded   Mental Status Exam Appearance and self-care   Stature:   Average   Weight:   Average weight   Clothing:   Casual   Grooming:   Normal   Cosmetic use:   None   Posture/gait:   Normal   Motor activity:   Not Remarkable   Sensorium  Attention:   Normal   Concentration:   Normal   Orientation:   X5   Recall/memory:   Normal   Affect and Mood  Affect:   Appropriate   Mood:   Other (Comment)   Relating  Eye contact:   Normal   Facial expression:   Responsive   Attitude toward examiner:   Cooperative   Thought and Language  Speech flow:  Clear and Coherent   Thought content:   Appropriate to Mood and Circumstances   Preoccupation:   None   Hallucinations:   None   Organization:  No data recorded  [employer] of Knowledge:   Fair   Intelligence:   Average   Abstraction:   Functional   Judgement:   Fair   Reality Testing:   Adequate   Insight:   Fair   Decision Making:   Normal  Social Functioning  Social Maturity:   Responsible   Social Judgement:   Normal   Stress  Stressors:   Other (Comment)   Coping Ability:   Normal   Skill Deficits:   None   Supports:   Friends/Service system     Religion: Religion/Spirituality Are You A Religious Person?: No  Leisure/Recreation: Leisure / Recreation Do You Have Hobbies?: No  Exercise/Diet: Exercise/Diet Do You Exercise?: No Have You Gained or Lost A Significant Amount of Weight in the Past Six Months?: No Do You Follow a Special Diet?: No Do You Have Any Trouble Sleeping?: No   CCA Employment/Education Employment/Work Situation: Employment / Work Situation Employment Situation: On disability Why is Patient on Disability: Mental Health How Long has Patient Been on Disability: Unknown Has Patient ever Been in the [employer]?: No  Education: Education Is Patient Currently Attending School?: No Did You Have An Individualized Education Program (IIEP): No Did You Have Any Difficulty At  School?: No Patient's Education Has Been Impacted by Current Illness: No   CCA Family/Childhood History Family and Relationship History: Family history Marital status: Single Does patient have children?: No  Childhood History:  Childhood History Did patient suffer any verbal/emotional/physical/sexual abuse as a child?: No Did patient suffer from severe childhood neglect?: No Has patient ever been sexually abused/assaulted/raped as an adolescent or adult?: No Was the patient ever a victim of a crime or a disaster?: No Witnessed domestic violence?: No Has patient been affected by domestic violence as an adult?: No  Child/Adolescent Assessment:     CCA Substance Use Alcohol/Drug Use: Alcohol / Drug Use Pain Medications: See MAR Prescriptions: See MAR Over the Counter: See MAR History of alcohol / drug use?: No history of alcohol / drug abuse                         ASAM's:  Six Dimensions of Multidimensional Assessment  Dimension 1:  Acute Intoxication and/or Withdrawal Potential:      Dimension 2:  Biomedical Conditions and Complications:      Dimension 3:  Emotional, Behavioral, or Cognitive Conditions and Complications:     Dimension 4:  Readiness to Change:     Dimension 5:  Relapse, Continued use, or Continued Problem Potential:     Dimension 6:  Recovery/Living Environment:     ASAM Severity Score:    ASAM Recommended Level of Treatment:     Substance use Disorder (SUD)    Recommendations for Services/Supports/Treatments:    DSM5 Diagnoses: Patient Active Problem List   Diagnosis Date Noted   New onset seizure ([place]) 09/98/3382   Acute metabolic encephalopathy 50/53/9767   Suicidal ideation 12/12/2019   Dehydration 12/12/2019   Seizure ([place]) 12/06/2019   Schizophrenia ([place]) 09/08/2019   Aggression 09/08/2019    Patient Centered Plan: Patient is on the following Treatment Plan(s):  Anxiety   Referrals to Alternative Service(s): Referred  to Alternative Service(s):   Place:   Date:   Time:    Referred to Alternative Service(s):   Place:   Date:   Time:    Referred to Alternative Service(s):   Place:   Date:   Time:    Referred to Alternative Service(s):   Place:   Date:   Time:      @BHCOLLABOFCARE @  [employer], LCAS-A

## 2022-03-08 NOTE — ED Notes (Signed)
Pt cleared to return to [facility] by way of cab per owner, Scot Jun. Pt not under legal guardianship and only listed to have POA in which is his father and father aware of ED visit/discharge. Discharge and evaluation went over with Nicholaus Corolla, reported nurse specialist at group home as well as owner, Scot Jun by TTS and NP.

## 2022-03-08 NOTE — ED Triage Notes (Signed)
First Nurse Note:  Pt via EMS from [place]. Pt has been more altered since noon today. Pt has a hx of schizophrenia.  CBG 121, 128/63 BP,

## 2022-03-08 NOTE — ED Triage Notes (Signed)
Pt comes via EMS from a group home more altered than normal. Unsure of baseline and what group home.  Pt states he is at the hospital [facility]. Pt did state to charge that he was having back pain.  Pt denies any back pain with this RN. Pt does states left sided pain. Pt denies any urinary symptoms.

## 2022-03-08 NOTE — ED Notes (Addendum)
Personnel at [facility] facility currently requesting pt be placed in cab to ride back but refusing to set it up herself and is demanding hospital set it up and cover cost; phone given to charge RN as requesting to speak with [facility] staff; otherwise [facility] staff states they cannot pick him up themselves until morning. [facility] facility received report from [facility] and psych staff.

## 2022-03-08 NOTE — ED Notes (Signed)
Verbal from psych staff that pt is "d/c".

## 2022-03-08 NOTE — ED Notes (Signed)
Psych team staff at bedside assessing pt. Pt alert, calm and cooperative.

## 2022-03-08 NOTE — ED Notes (Signed)
Pt given a cup of sprite and crackers with peanut butter at this time.

## 2022-03-08 NOTE — ED Notes (Signed)
Cab secured by Marcy Salvo, ed charge per facility arrangement. Pt is ambulatory, discharged with all personal belongings pt came with. Care of this pt assumed by me at 2306 after report received from georgie, rn. Pt declines to have rn repeat vital signs for discharge. Pt walked to cab by this rn and security. Paperwork given to pt to give to group home. Per vanessa, report has already been called to group home.

## 2022-03-08 NOTE — ED Notes (Signed)
Pt given food tray as requested.

## 2022-03-08 NOTE — ED Notes (Signed)
Pt given a fresh warm blanket as mild shivering noted. Pt denies any other needs currently. Walkman and earbuds within full view on pt's mattress.

## 2022-03-08 NOTE — ED Notes (Addendum)
Pt continuing to move around in chair. Pt unable to sit still. Got EKG best that we could get. No blood at this time. Dueol to pt not sitting still. Pt keeps looking behind him and out the door. Pt seems paranoid. Pt now drooling from mouth. First nurse informed of needing room given his HR and temp. Will inform her of new findings.  Pt does have hx of aggressive behavior.

## 2022-03-08 NOTE — ED Notes (Signed)
Pt and I had a conversation about the CD walkman disc he had on ([employer]) and discussed favorite tracks. I then was able to obtain bloodwork, although the pt was trying to touch his genitals, both above and below the pants.

## 2022-03-08 NOTE — ED Notes (Signed)
Pt back from imaging. Pt to be dressed out soon.

## 2022-03-08 NOTE — Consult Note (Signed)
[facility] Face-to-Face Psychiatry Consult   Reason for Consult: Altered Mental Status  Referring Physician: Dr. Sidney Ace Patient Identification: Brian Stafford MRN:  [id number] Principal Diagnosis: <principal problem not specified> Diagnosis:  Active Problems:   Schizophrenia (HCC)   Aggression   Seizure (HCC)   New onset seizure (HCC)   Total Time spent with patient: 1 hour  Subjective: "Can I stay here?" Brian Stafford is a 47 y.o. male patient presented to [facility] ED is via EMS voluntary. The patient is asking to "stay here." The patient had been nonverbal with most of the staff in the ED. He was able to participate in the assessment process. He is not able to say why he came to the hospital but only wants to know if he can stay there. The patient was able to answer all questions.  This provider saw The patient face-to-face; the chart was reviewed, and Dr. Sidney Ace was consulted on 03/08/2022 due to the patient's care. It was discussed with the EDP that the patient does not meet the criteria to be admitted to the psychiatric inpatient unit.  Upon evaluation, the patient is alert and oriented x3, calm, cooperative, and mood-congruent with affect. The patient does not appear to be responding to internal or external stimuli. Neither is the patient presenting with any delusional thinking. The patient denies auditory or visual hallucinations. The patient denies any suicidal, homicidal, or self-harm ideations. The patient is not presenting with any psychotic or paranoid behaviors. During an encounter with the patient, he could answer questions appropriately.  HPI: Per Dr. Sidney Ace, Brian Stafford is a 47 y.o. male past medical history of schizophrenia, bipolar disorder and anxiety, seizure disorder who presents with altered mental status.  Patient was sent from his facility because his not been acting like himself.  Patient tells me he is here because he is "sick".  Spoke with his dad who tells me that he  is at a relatively new group home and that he has episodes where he becomes less responsive but that this is normal for him and that if he is just left alone that he typically will come out of it.    Apparently patient told triage nurse that he is having back pain.  He is denying any specific complaints to me currently.  Says that he would like to stay here.  Past Psychiatric History:  Anxiety    Bipolar 1 disorder (HCC)   Mental retardation   Schizophrenia (HCC)    Risk to Self:   Risk to Others:   Prior Inpatient Therapy:   Prior Outpatient Therapy:    Past Medical History:  Past Medical History:  Diagnosis Date   Anxiety    Bipolar 1 disorder (HCC)    Mental retardation    Schizophrenia (HCC)    No past surgical history on file. Family History:  Family History  Family history unknown: Yes   Family Psychiatric  History: History reviewed. No pertinent family psychiatric history Social History:  Social History   Substance and Sexual Activity  Alcohol Use Not Currently   Comment: UTA     Social History   Substance and Sexual Activity  Drug Use Not Currently   Comment: UTA    Social History   Socioeconomic History   Marital status: Single    Spouse name: Not on file   Number of children: Not on file   Years of education: Not on file   Highest education level: Not on file  Occupational History  Not on file  Tobacco Use   Smoking status: Unknown   Smokeless tobacco: Never   Tobacco comments:    UTA  Vaping Use   Vaping Use: Unknown  Substance and Sexual Activity   Alcohol use: Not Currently    Comment: UTA   Drug use: Not Currently    Comment: UTA   Sexual activity: Not on file    Comment: UTA  Other Topics Concern   Not on file  Social History Narrative   Not on file   Social Determinants of Health   Financial Resource Strain: Not on file  Food Insecurity: Not on file  Transportation Needs: Not on file  Physical Activity: Not on file  Stress:  Not on file  Social Connections: Not on file   Additional Social History:    Allergies:  No Known Allergies  Labs:  Results for orders placed or performed during the hospital encounter of 03/08/22 (from the past 48 hour(s))  Comprehensive metabolic panel     Status: Abnormal   Collection Time: 03/08/22  6:30 PM  Result Value Ref Range   Sodium 138 135 - 145 mmol/L   Potassium 3.8 3.5 - 5.1 mmol/L   Chloride 105 98 - 111 mmol/L   CO2 22 22 - 32 mmol/L   Glucose, Bld 99 70 - 99 mg/dL    Comment: Glucose reference range applies only to samples taken after fasting for at least 8 hours.   BUN 20 6 - 20 mg/dL   Creatinine, Ser 1.32 (H) 0.61 - 1.24 mg/dL   Calcium 9.1 8.9 - 10.3 mg/dL   Total Protein 7.5 6.5 - 8.1 g/dL   Albumin 4.1 3.5 - 5.0 g/dL   AST 23 15 - 41 U/L   ALT 22 0 - 44 U/L   Alkaline Phosphatase 74 38 - 126 U/L   Total Bilirubin 0.7 0.3 - 1.2 mg/dL   GFR, Estimated >60 >60 mL/min    Comment: (NOTE) Calculated using the CKD-EPI Creatinine Equation (2021)    Anion gap 11 5 - 15    Comment: Performed at [facility], [address] [place], [place] [address]  CBC     Status: None   Collection Time: 03/08/22  6:30 PM  Result Value Ref Range   WBC 6.9 4.0 - 10.5 K/uL   RBC 4.72 4.22 - 5.81 MIL/uL   Hemoglobin 13.0 13.0 - 17.0 g/dL   HCT 40.0 39.0 - 52.0 %   MCV 84.7 80.0 - 100.0 fL   MCH 27.5 26.0 - 34.0 pg   MCHC 32.5 30.0 - 36.0 g/dL   RDW 12.2 11.5 - 15.5 %   Platelets 219 150 - 400 K/uL   nRBC 0.0 0.0 - 0.2 %    Comment: Performed at [facility], [address] [place], [place] [address]  Urine Drug Screen, Qualitative ([facility] only)     Status: Abnormal   Collection Time: 03/08/22  8:59 PM  Result Value Ref Range   Tricyclic, Ur Screen POSITIVE (A) NONE DETECTED   Amphetamines, Ur Screen NONE DETECTED NONE DETECTED   MDMA (Ecstasy)Ur Screen NONE DETECTED NONE DETECTED   Cocaine Metabolite,Ur [place] NONE DETECTED NONE DETECTED   Opiate, Ur  Screen NONE DETECTED NONE DETECTED   Phencyclidine (PCP) Ur S NONE DETECTED NONE DETECTED   Cannabinoid 50 Ng, Ur [place] NONE DETECTED NONE DETECTED   Barbiturates, Ur Screen NONE DETECTED NONE DETECTED   Benzodiazepine, Ur Scrn NONE DETECTED NONE DETECTED  Methadone Scn, Ur NONE DETECTED NONE DETECTED    Comment: (NOTE) Tricyclics + metabolites, urine    Cutoff 1000 ng/mL Amphetamines + metabolites, urine  Cutoff 1000 ng/mL MDMA (Ecstasy), urine              Cutoff 500 ng/mL Cocaine Metabolite, urine          Cutoff 300 ng/mL Opiate + metabolites, urine        Cutoff 300 ng/mL Phencyclidine (PCP), urine         Cutoff 25 ng/mL Cannabinoid, urine                 Cutoff 50 ng/mL Barbiturates + metabolites, urine  Cutoff 200 ng/mL Benzodiazepine, urine              Cutoff 200 ng/mL Methadone, urine                   Cutoff 300 ng/mL  The urine drug screen provides only a preliminary, unconfirmed analytical test result and should not be used for non-medical purposes. Clinical consideration and professional judgment should be applied to any positive drug screen result due to possible interfering substances. A more specific alternate chemical method must be used in order to obtain a confirmed analytical result. Gas chromatography / mass spectrometry (GC/MS) is the preferred confirm atory method. Performed at [facility], [address] [place], [place] [address]     No current facility-administered medications for this encounter.   Current Outpatient Medications  Medication Sig Dispense Refill   benztropine (COGENTIN) 1 MG tablet Take 1 mg by mouth daily.     benztropine (COGENTIN) 2 MG tablet Take 1 tablet (2 mg total) by mouth daily. 30 tablet 0   cloZAPine (CLOZARIL) 100 MG tablet Take 300 mg by mouth in the morning and at bedtime.     divalproex (DEPAKOTE ER) 500 MG 24 hr tablet Take 1,000 mg by mouth at bedtime.     FLUoxetine (PROZAC) 40 MG capsule Take 1 capsule (40  mg total) by mouth daily. 30 capsule 0   Multiple Vitamin (MULTIVITAMIN WITH MINERALS) TABS Take 1 tablet by mouth daily.     OLANZapine (ZYPREXA) 10 MG tablet Take 10 mg by mouth at bedtime.     propranolol ER (INDERAL LA) 60 MG 24 hr capsule Take 1 capsule (60 mg total) by mouth daily. 30 capsule 0   triamcinolone cream (KENALOG) 0.1 % Apply topically 2 (two) times daily as needed.     Vitamin D, Ergocalciferol, (DRISDOL) 1.25 MG (50000 UNIT) CAPS capsule Take 50,000 Units by mouth once a week.      Musculoskeletal: Strength & Muscle Tone: within normal limits Gait & Station: normal Patient leans: N/A Psychiatric Specialty Exam:  Presentation  General Appearance:  Bizarre  Eye Contact: Minimal  Speech: Clear and Coherent  Speech Volume: Normal  Handedness: Right   Mood and Affect  Mood: Euthymic  Affect: Blunt   Thought Process  Thought Processes: Coherent  Descriptions of Associations:Intact  Orientation:No data recorded Thought Content:Logical  History of Schizophrenia/Schizoaffective disorder:Yes  Duration of Psychotic Symptoms:No data recorded Hallucinations:Hallucinations: None  Ideas of Reference:None  Suicidal Thoughts:Suicidal Thoughts: No  Homicidal Thoughts:Homicidal Thoughts: No   Sensorium  Memory: Immediate Fair; Recent Fair; Remote Fair  Judgment: Fair  Insight: Fair   Community education officer  Concentration: Fair  Attention Span: Fair  Recall: [employer] of Knowledge: Fair  Language: Fair   Psychomotor Activity  Psychomotor Activity: Psychomotor Activity:  Normal   Assets  Assets: Armed forces logistics/support/administrative officer; Physical Health; Resilience; Social Support   Sleep  Sleep: Sleep: Good Number of Hours of Sleep: 8   Physical Exam: Physical Exam Vitals and nursing note reviewed.  Constitutional:      Appearance: Normal appearance. He is normal weight.  HENT:     Head: Normocephalic and atraumatic.     Right  Ear: External ear normal.     Left Ear: External ear normal.     Nose: Nose normal.     Mouth/Throat:     Mouth: Mucous membranes are moist.  Cardiovascular:     Rate and Rhythm: Normal rate.     Pulses: Normal pulses.  Pulmonary:     Effort: Pulmonary effort is normal.  Musculoskeletal:        General: Normal range of motion.     Cervical back: Normal range of motion and neck supple.  Neurological:     General: No focal deficit present.     Mental Status: He is alert and oriented to person, place, and time.  Psychiatric:        Mood and Affect: Mood normal.        Behavior: Behavior normal.        Thought Content: Thought content normal.        Judgment: Judgment normal.    ROS Blood pressure 124/76, pulse 91, temperature 98.7 F (37.1 C), temperature source Oral, resp. rate 20, SpO2 100 %. There is no height or weight on file to calculate BMI.  Treatment Plan Summary: The patient is not a safety risk to himself or others and does not require psychiatric inpatient admission for stabilization and treatment.   Disposition: No evidence of imminent risk to self or others at present.   Patient does not meet criteria for psychiatric inpatient admission. Supportive therapy provided about ongoing stressors. Discussed crisis plan, support from social network, calling 911, coming to the Emergency Department, and calling Suicide Hotline.  Caroline Sauger, NP 03/08/2022 11:38 PM

## 2022-03-08 NOTE — BH Assessment (Signed)
Group Home staff contact: [phone] or [phone]

## 2022-03-15 ENCOUNTER — Emergency Department
Admission: EM | Admit: 2022-03-15 | Discharge: 2022-03-16 | Disposition: A | Discharge status: Other Acute Inpatient Hospital | Payer: Medicare HMO | Attending: Emergency Medicine | Admitting: Emergency Medicine

## 2022-03-15 ENCOUNTER — Other Ambulatory Visit: Payer: Self-pay

## 2022-03-15 ENCOUNTER — Encounter: Payer: Self-pay | Admitting: Emergency Medicine

## 2022-03-15 DIAGNOSIS — R45851 Suicidal ideations: Secondary | ICD-10-CM | POA: Diagnosis not present

## 2022-03-15 DIAGNOSIS — F200 Paranoid schizophrenia: Secondary | ICD-10-CM | POA: Diagnosis not present

## 2022-03-15 DIAGNOSIS — R569 Unspecified convulsions: Secondary | ICD-10-CM | POA: Diagnosis not present

## 2022-03-15 DIAGNOSIS — F203 Undifferentiated schizophrenia: Secondary | ICD-10-CM | POA: Diagnosis present

## 2022-03-15 DIAGNOSIS — R4689 Other symptoms and signs involving appearance and behavior: Secondary | ICD-10-CM | POA: Diagnosis present

## 2022-03-15 DIAGNOSIS — F22 Delusional disorders: Secondary | ICD-10-CM | POA: Diagnosis present

## 2022-03-15 DIAGNOSIS — F209 Schizophrenia, unspecified: Secondary | ICD-10-CM | POA: Diagnosis present

## 2022-03-15 LAB — CBC WITH DIFFERENTIAL/PLATELET
Abs Immature Granulocytes: 0.02 10*3/uL (ref 0.00–0.07)
Basophils Absolute: 0 10*3/uL (ref 0.0–0.1)
Basophils Relative: 0 %
Eosinophils Absolute: 0 10*3/uL (ref 0.0–0.5)
Eosinophils Relative: 0 %
HCT: 39.1 % (ref 39.0–52.0)
Hemoglobin: 12.8 g/dL — ABNORMAL LOW (ref 13.0–17.0)
Immature Granulocytes: 0 %
Lymphocytes Relative: 24 %
Lymphs Abs: 1.8 10*3/uL (ref 0.7–4.0)
MCH: 27.6 pg (ref 26.0–34.0)
MCHC: 32.7 g/dL (ref 30.0–36.0)
MCV: 84.4 fL (ref 80.0–100.0)
Monocytes Absolute: 0.9 10*3/uL (ref 0.1–1.0)
Monocytes Relative: 12 %
Neutro Abs: 4.6 10*3/uL (ref 1.7–7.7)
Neutrophils Relative %: 64 %
Platelets: 248 10*3/uL (ref 150–400)
RBC: 4.63 MIL/uL (ref 4.22–5.81)
RDW: 12.5 % (ref 11.5–15.5)
WBC: 7.3 10*3/uL (ref 4.0–10.5)
nRBC: 0 % (ref 0.0–0.2)

## 2022-03-15 LAB — URINE DRUG SCREEN, QUALITATIVE (ARMC ONLY)
Amphetamines, Ur Screen: NOT DETECTED
Barbiturates, Ur Screen: NOT DETECTED
Benzodiazepine, Ur Scrn: NOT DETECTED
Cannabinoid 50 Ng, Ur ~~LOC~~: NOT DETECTED
Cocaine Metabolite,Ur ~~LOC~~: NOT DETECTED
MDMA (Ecstasy)Ur Screen: NOT DETECTED
Methadone Scn, Ur: NOT DETECTED
Opiate, Ur Screen: NOT DETECTED
Phencyclidine (PCP) Ur S: NOT DETECTED
Tricyclic, Ur Screen: POSITIVE — AB

## 2022-03-15 LAB — CBC
HCT: 41.7 % (ref 39.0–52.0)
Hemoglobin: 13.5 g/dL (ref 13.0–17.0)
MCH: 27.2 pg (ref 26.0–34.0)
MCHC: 32.4 g/dL (ref 30.0–36.0)
MCV: 84.1 fL (ref 80.0–100.0)
Platelets: 253 10*3/uL (ref 150–400)
RBC: 4.96 MIL/uL (ref 4.22–5.81)
RDW: 12.4 % (ref 11.5–15.5)
WBC: 7.3 10*3/uL (ref 4.0–10.5)
nRBC: 0 % (ref 0.0–0.2)

## 2022-03-15 LAB — COMPREHENSIVE METABOLIC PANEL
ALT: 29 U/L (ref 0–44)
AST: 46 U/L — ABNORMAL HIGH (ref 15–41)
Albumin: 4 g/dL (ref 3.5–5.0)
Alkaline Phosphatase: 72 U/L (ref 38–126)
Anion gap: 12 (ref 5–15)
BUN: 18 mg/dL (ref 6–20)
CO2: 21 mmol/L — ABNORMAL LOW (ref 22–32)
Calcium: 9.2 mg/dL (ref 8.9–10.3)
Chloride: 105 mmol/L (ref 98–111)
Creatinine, Ser: 1.33 mg/dL — ABNORMAL HIGH (ref 0.61–1.24)
GFR, Estimated: >60 mL/min (ref >60)
Glucose, Bld: 111 mg/dL — ABNORMAL HIGH (ref 70–99)
Potassium: 3.3 mmol/L — ABNORMAL LOW (ref 3.5–5.1)
Sodium: 138 mmol/L (ref 135–145)
Total Bilirubin: 0.5 mg/dL (ref 0.3–1.2)
Total Protein: 7.6 g/dL (ref 6.5–8.1)

## 2022-03-15 LAB — ETHANOL: Alcohol, Ethyl (B): <10 mg/dL (ref <10)

## 2022-03-15 LAB — ACETAMINOPHEN LEVEL: Acetaminophen (Tylenol), Serum: <10 ug/mL — ABNORMAL LOW (ref 10–30)

## 2022-03-15 LAB — SALICYLATE LEVEL: Salicylate Lvl: <7 mg/dL — ABNORMAL LOW (ref 7.0–30.0)

## 2022-03-15 MED ORDER — VITAMIN D (ERGOCALCIFEROL) 1.25 MG (50000 UNIT) PO CAPS
50000.0000 [IU] | ORAL_CAPSULE | ORAL | Status: DC
  Administered 2022-03-15: 50000 [IU] via ORAL
  Filled 2022-03-15: qty 1

## 2022-03-15 MED ORDER — DIVALPROEX SODIUM ER 250 MG PO TB24
1000.0000 mg | ORAL_TABLET | Freq: Every day | ORAL | Status: DC
  Administered 2022-03-15: 1000 mg via ORAL
  Filled 2022-03-15: qty 4

## 2022-03-15 MED ORDER — OLANZAPINE 10 MG PO TABS
10.0000 mg | ORAL_TABLET | Freq: Every day | ORAL | Status: DC
  Administered 2022-03-15: 10 mg via ORAL
  Filled 2022-03-15: qty 1

## 2022-03-15 MED ORDER — POTASSIUM CHLORIDE CRYS ER 20 MEQ PO TBCR
40.0000 meq | EXTENDED_RELEASE_TABLET | Freq: Once | ORAL | Status: AC
  Administered 2022-03-15: 40 meq via ORAL
  Filled 2022-03-15: qty 2

## 2022-03-15 MED ORDER — PROPRANOLOL HCL ER 60 MG PO CP24
60.0000 mg | ORAL_CAPSULE | Freq: Every day | ORAL | Status: DC
  Filled 2022-03-15: qty 1

## 2022-03-15 MED ORDER — CLOZAPINE 100 MG PO TABS
300.0000 mg | ORAL_TABLET | Freq: Every morning | ORAL | Status: DC
  Administered 2022-03-16: 300 mg via ORAL
  Filled 2022-03-15: qty 3

## 2022-03-15 MED ORDER — BENZTROPINE MESYLATE 1 MG PO TABS
2.0000 mg | ORAL_TABLET | Freq: Every day | ORAL | Status: DC
  Administered 2022-03-15 – 2022-03-16 (×2): 2 mg via ORAL
  Filled 2022-03-15 (×2): qty 2

## 2022-03-15 MED ORDER — CLOZAPINE 100 MG PO TABS
100.0000 mg | ORAL_TABLET | Freq: Every day | ORAL | Status: DC
  Administered 2022-03-15: 100 mg via ORAL
  Filled 2022-03-15: qty 1

## 2022-03-15 MED ORDER — FLUOXETINE HCL 20 MG PO CAPS
40.0000 mg | ORAL_CAPSULE | Freq: Every day | ORAL | Status: DC
  Administered 2022-03-16: 40 mg via ORAL
  Filled 2022-03-15: qty 2

## 2022-03-15 MED ORDER — ACETAMINOPHEN 325 MG PO TABS: 650.0000 mg | ORAL_TABLET | ORAL | Status: DC | PRN

## 2022-03-15 MED ORDER — CLOZAPINE 100 MG PO TABS: 300.0000 mg | ORAL_TABLET | Freq: Two times a day (BID) | ORAL | Status: DC

## 2022-03-15 NOTE — ED Provider Notes (Signed)
[facility] Provider Note    Event Date/Time   First MD Initiated Contact with Patient 03/15/22 1735     (approximate)   History   IVC   HPI  Brian Stafford is a 47 y.o. male  here with IVC. Pt has schizophrenia and is here under IVC from group home. Pt reportedly cracked window of group home worker's car and has bee more paranoid. He has been aggressive toward male staff. Unknown if he has been taking his medications. He is somewhat evasive on my exam.      Physical Exam   Triage Vital Signs: ED Triage Vitals  Enc Vitals Group     BP 03/15/22 1624 (!) 144/92     Pulse Rate 03/15/22 1624 (!) 116     Resp 03/15/22 1624 16     Temp 03/15/22 1624 98.5 F (36.9 C)     Temp src --      SpO2 03/15/22 1624 100 %     Weight --      Height --      Head Circumference --      Peak Flow --      Pain Score 03/15/22 1629 0     Pain Loc --      Pain Edu? --      Excl. in [place]? --     Most recent vital signs: Vitals:   03/15/22 1624  BP: (!) 144/92  Pulse: (!) 116  Resp: 16  Temp: 98.5 F (36.9 C)  SpO2: 100%     General: Awake, no distress.  CV:  Good peripheral perfusion.  Resp:  Normal effort.  Abd:  No distention.  Other:  Evasive, paranoid. Unwilling to provide much history as to how he has been doing. Denies SI.   ED Results / Procedures / Treatments   Labs (all labs ordered are listed, but only abnormal results are displayed) Labs Reviewed  COMPREHENSIVE METABOLIC PANEL - Abnormal; Notable for the following components:      Result Value   Potassium 3.3 (*)    CO2 21 (*)    Glucose, Bld 111 (*)    Creatinine, Ser 1.33 (*)    AST 46 (*)    All other components within normal limits  SALICYLATE LEVEL - Abnormal; Notable for the following components:   Salicylate Lvl [id number] (*)    All other components within normal limits  ACETAMINOPHEN LEVEL - Abnormal; Notable for the following components:   Acetaminophen (Tylenol), Serum  <10 (*)    All other components within normal limits  URINE DRUG SCREEN, QUALITATIVE ([facility] ONLY) - Abnormal; Notable for the following components:   Tricyclic, Ur Screen POSITIVE (*)    All other components within normal limits  ETHANOL  CBC  CBC WITH DIFFERENTIAL/PLATELET     EKG    RADIOLOGY    I also independently reviewed and agree with radiologist interpretations.   PROCEDURES:  Critical Care performed: No   MEDICATIONS ORDERED IN ED: Medications  benztropine (COGENTIN) tablet 2 mg (2 mg Oral Given 03/15/22 2020)  cloZAPine (CLOZARIL) tablet 300 mg (has no administration in time range)  divalproex (DEPAKOTE ER) 24 hr tablet 1,000 mg (has no administration in time range)  FLUoxetine (PROZAC) capsule 40 mg (has no administration in time range)  OLANZapine (ZYPREXA) tablet 10 mg (has no administration in time range)  propranolol ER (INDERAL LA) 24 hr capsule 60 mg (has no administration in time range)  Vitamin D (Ergocalciferol) (  DRISDOL) 1.25 MG (50000 UNIT) capsule 50,000 Units (has no administration in time range)  acetaminophen (TYLENOL) tablet 650 mg (has no administration in time range)     IMPRESSION / MDM / ASSESSMENT AND PLAN / ED COURSE  I reviewed the triage vital signs and the nursing notes.                              Differential diagnosis includes, but is not limited to, decompensated schizophrenia, behavioral issue  Patient's presentation is most consistent with acute presentation with potential threat to life or bodily function.  47 yo M with h/o schizophrenia here with increasingly aggressive/dangerous behavior at his group home. Labs show mild hypokalemia which was replaced. No other organic emergency. Tts/psych consulted.    FINAL CLINICAL IMPRESSION(S) / ED DIAGNOSES   Final diagnoses:  Paranoid schizophrenia ([place])     Rx / DC Orders   ED Discharge Orders     None        Note:  This document was prepared using Dragon voice  recognition software and may include unintentional dictation errors.   Duffy Bruce, MD 03/15/22 2024

## 2022-03-15 NOTE — ED Notes (Signed)
Snack: a cup of vanilla icecream and a cup of sprite.

## 2022-03-15 NOTE — Consult Note (Signed)
[facility] Consult   Reason for Consult:IVC  Referring Physician: Dr. Ellender Hose Patient Identification: Brian Stafford MRN:  [id number] Principal Diagnosis: <principal problem not specified> Diagnosis:  Active Problems:   Schizophrenia [facility])   Aggression   New onset seizure ([place])   Suicidal ideation   Total Time spent with patient: 1 hour  Subjective: " I had one of my moods." Brian Stafford is a 47 y.o. male patient presented to [facility] ED via POV under involuntary commitment status (IVC). The patient stated that he was at the hospital because "I had one of my moods; I started running after a car."  It was shared that the patient cracked the window of the group home worker's car and has been more paranoid over the past three days. They shared that the patient has been more aggressive towards male staff than male staff.  This provider saw the patient face-to-face, reviewed the chart, and consulted with Dr. Ellender Hose on 03/15/2022 due to the patient's care. Both providers discussed whether the patient meets the criteria for admission to the psychiatric inpatient unit.  Upon evaluation, the patient is alert and oriented x 4, calm, cooperative, and mood-congruent with affect. The patient does not appear to be responding to internal or external stimuli. Neither is the patient presenting with any delusional thinking. The patient does admit to auditory and visual hallucinations. The patient denies any suicidal, homicidal, or self-harm ideations. The patient is not presenting with any psychotic or paranoid behaviors. During an encounter with the patient, he could answer some questions appropriately.  HPI: Per Dr. Leida Lauth is a 47 y.o. male  here with IVC. Pt has schizophrenia and is here under IVC from group home. Pt reportedly cracked window of group home worker's car and has bee more paranoid. He has been aggressive toward male staff. Unknown if he has been taking his  medications. He is somewhat evasive on my exam.   Past Psychiatric History:  Anxiety Bipolar 1 disorder ([place]) Mental retardation Schizophrenia ([place])   Risk to Self:   Risk to Others:   Prior Inpatient Therapy:   Prior Outpatient Therapy:    Past Medical History:  Past Medical History:  Diagnosis Date   Anxiety    Bipolar 1 disorder ([place])    Mental retardation    Schizophrenia ([place])    History reviewed. No pertinent surgical history. Family History:  Family History  Family history unknown: Yes   Family Psychiatric  History: History reviewed. No pertinent family psychiatric history  Social History:  Social History   Substance and Sexual Activity  Alcohol Use Not Currently   Comment: UTA     Social History   Substance and Sexual Activity  Drug Use Not Currently   Comment: UTA    Social History   Socioeconomic History   Marital status: Single    Spouse name: Not on file   Number of children: Not on file   Years of education: Not on file   Highest education level: Not on file  Occupational History   Not on file  Tobacco Use   Smoking status: Unknown   Smokeless tobacco: Never   Tobacco comments:    UTA  Vaping Use   Vaping Use: Unknown  Substance and Sexual Activity   Alcohol use: Not Currently    Comment: UTA   Drug use: Not Currently    Comment: UTA   Sexual activity: Not on file    Comment: UTA  Other Topics Concern  Not on file  Social History Narrative   Not on file   Social Determinants of Health   Financial Resource Strain: Not on file  Food Insecurity: Not on file  Transportation Needs: Not on file  Physical Activity: Not on file  Stress: Not on file  Social Connections: Not on file   Additional Social History:    Allergies:  No Known Allergies  Labs:  Results for orders placed or performed during the hospital encounter of 03/15/22 (from the past 48 hour(s))  Urine Drug Screen, Qualitative     Status: Abnormal   Collection  Time: 03/15/22  4:15 PM  Result Value Ref Range   Tricyclic, Ur Screen POSITIVE (A) NONE DETECTED   Amphetamines, Ur Screen NONE DETECTED NONE DETECTED   MDMA (Ecstasy)Ur Screen NONE DETECTED NONE DETECTED   Cocaine Metabolite,Ur [place] NONE DETECTED NONE DETECTED   Opiate, Ur Screen NONE DETECTED NONE DETECTED   Phencyclidine (PCP) Ur S NONE DETECTED NONE DETECTED   Cannabinoid 50 Ng, Ur [place] NONE DETECTED NONE DETECTED   Barbiturates, Ur Screen NONE DETECTED NONE DETECTED   Benzodiazepine, Ur Scrn NONE DETECTED NONE DETECTED   Methadone Scn, Ur NONE DETECTED NONE DETECTED    Comment: (NOTE) Tricyclics + metabolites, urine    Cutoff 1000 ng/mL Amphetamines + metabolites, urine  Cutoff 1000 ng/mL MDMA (Ecstasy), urine              Cutoff 500 ng/mL Cocaine Metabolite, urine          Cutoff 300 ng/mL Opiate + metabolites, urine        Cutoff 300 ng/mL Phencyclidine (PCP), urine         Cutoff 25 ng/mL Cannabinoid, urine                 Cutoff 50 ng/mL Barbiturates + metabolites, urine  Cutoff 200 ng/mL Benzodiazepine, urine              Cutoff 200 ng/mL Methadone, urine                   Cutoff 300 ng/mL  The urine drug screen provides only a preliminary, unconfirmed analytical test result and should not be used for non-medical purposes. Clinical consideration and professional judgment should be applied to any positive drug screen result due to possible interfering substances. A more specific alternate chemical method must be used in order to obtain a confirmed analytical result. Gas chromatography / mass spectrometry (GC/MS) is the preferred confirm atory method. Performed at [facility], [address] [place], [place] [address]   Comprehensive metabolic panel     Status: Abnormal   Collection Time: 03/15/22  4:32 PM  Result Value Ref Range   Sodium 138 135 - 145 mmol/L   Potassium 3.3 (L) 3.5 - 5.1 mmol/L   Chloride 105 98 - 111 mmol/L   CO2 21 (L) 22 - 32 mmol/L    Glucose, Bld 111 (H) 70 - 99 mg/dL    Comment: Glucose reference range applies only to samples taken after fasting for at least 8 hours.   BUN 18 6 - 20 mg/dL   Creatinine, Ser 1.33 (H) 0.61 - 1.24 mg/dL   Calcium 9.2 8.9 - 10.3 mg/dL   Total Protein 7.6 6.5 - 8.1 g/dL   Albumin 4.0 3.5 - 5.0 g/dL   AST 46 (H) 15 - 41 U/L   ALT 29 0 - 44 U/L   Alkaline Phosphatase 72 38 - 126 U/L  Total Bilirubin 0.5 0.3 - 1.2 mg/dL   GFR, Estimated >60 >60 mL/min    Comment: (NOTE) Calculated using the CKD-EPI Creatinine Equation (2021)    Anion gap 12 5 - 15    Comment: Performed at [facility], [address] [place], [place] [address]  Ethanol     Status: None   Collection Time: 03/15/22  4:32 PM  Result Value Ref Range   Alcohol, Ethyl (B) <10 <10 mg/dL    Comment: (NOTE) Lowest detectable limit for serum alcohol is 10 mg/dL.  For medical purposes only. Performed at [facility], [address] [place], [place] [id number]   Salicylate level     Status: Abnormal   Collection Time: 03/15/22  4:32 PM  Result Value Ref Range   Salicylate Lvl Q000111Q (L) 7.0 - 30.0 mg/dL    Comment: Performed at [facility], [address] [place], [place] [address]  Acetaminophen level     Status: Abnormal   Collection Time: 03/15/22  4:32 PM  Result Value Ref Range   Acetaminophen (Tylenol), Serum <10 (L) 10 - 30 ug/mL    Comment: (NOTE) Therapeutic concentrations vary significantly. A range of 10-30 ug/mL  may be an effective concentration for many patients. However, some  are best treated at concentrations outside of this range. Acetaminophen concentrations >150 ug/mL at 4 hours after ingestion  and >50 ug/mL at 12 hours after ingestion are often associated with  toxic reactions.  Performed at [facility], [address] [place], [place] [address]   cbc     Status: None   Collection Time: 03/15/22  4:32 PM  Result Value Ref Range   WBC 7.3 4.0 - 10.5  K/uL   RBC 4.96 4.22 - 5.81 MIL/uL   Hemoglobin 13.5 13.0 - 17.0 g/dL   HCT 41.7 39.0 - 52.0 %   MCV 84.1 80.0 - 100.0 fL   MCH 27.2 26.0 - 34.0 pg   MCHC 32.4 30.0 - 36.0 g/dL   RDW 12.4 11.5 - 15.5 %   Platelets 253 150 - 400 K/uL   nRBC 0.0 0.0 - 0.2 %    Comment: Performed at [facility], [address] [place], [place] [address]  CBC with Differential/Platelet     Status: Abnormal   Collection Time: 03/15/22  8:45 PM  Result Value Ref Range   WBC 7.3 4.0 - 10.5 K/uL   RBC 4.63 4.22 - 5.81 MIL/uL   Hemoglobin 12.8 (L) 13.0 - 17.0 g/dL   HCT 39.1 39.0 - 52.0 %   MCV 84.4 80.0 - 100.0 fL   MCH 27.6 26.0 - 34.0 pg   MCHC 32.7 30.0 - 36.0 g/dL   RDW 12.5 11.5 - 15.5 %   Platelets 248 150 - 400 K/uL   nRBC 0.0 0.0 - 0.2 %   Neutrophils Relative % 64 %   Neutro Abs 4.6 1.7 - 7.7 K/uL   Lymphocytes Relative 24 %   Lymphs Abs 1.8 0.7 - 4.0 K/uL   Monocytes Relative 12 %   Monocytes Absolute 0.9 0.1 - 1.0 K/uL   Eosinophils Relative 0 %   Eosinophils Absolute 0.0 0.0 - 0.5 K/uL   Basophils Relative 0 %   Basophils Absolute 0.0 0.0 - 0.1 K/uL   Immature Granulocytes 0 %   Abs Immature Granulocytes 0.02 0.00 - 0.07 K/uL    Comment: Performed at [facility], [address] [place], [place] [address]  Current Facility-Administered Medications  Medication Dose Route Frequency Provider Last Rate Last Admin   acetaminophen (TYLENOL) tablet 650 mg  650 mg Oral Q4H PRN Duffy Bruce, MD       benztropine (COGENTIN) tablet 2 mg  2 mg Oral Daily Duffy Bruce, MD   2 mg at 03/15/22 2020   cloZAPine (CLOZARIL) tablet 100 mg  100 mg Oral QHS Darrick Penna, RPH   100 mg at 03/15/22 2155   [START ON 03/16/2022] cloZAPine (CLOZARIL) tablet 300 mg  300 mg Oral q morning Darrick Penna, RPH       divalproex (DEPAKOTE ER) 24 hr tablet 1,000 mg  1,000 mg Oral QHS Duffy Bruce, MD   1,000 mg at 03/15/22 2115   [START ON 03/16/2022] FLUoxetine (PROZAC) capsule  40 mg  40 mg Oral Daily Duffy Bruce, MD       OLANZapine (ZYPREXA) tablet 10 mg  10 mg Oral QHS Duffy Bruce, MD   10 mg at 03/15/22 2116   [START ON 03/16/2022] propranolol ER (INDERAL LA) 24 hr capsule 60 mg  60 mg Oral Daily Duffy Bruce, MD       Vitamin D (Ergocalciferol) (DRISDOL) 1.25 MG (50000 UNIT) capsule 50,000 Units  50,000 Units Oral Weekly Duffy Bruce, MD   50,000 Units at 03/15/22 2116   Current Outpatient Medications  Medication Sig Dispense Refill   benztropine (COGENTIN) 1 MG tablet Take 1 mg by mouth daily.     benztropine (COGENTIN) 2 MG tablet Take 1 tablet (2 mg total) by mouth daily. 30 tablet 0   cloZAPine (CLOZARIL) 100 MG tablet Take 100-300 mg by mouth in the morning and at bedtime. 336m qam and 1047mqpm     divalproex (DEPAKOTE ER) 500 MG 24 hr tablet Take 1,000 mg by mouth at bedtime.     FLUoxetine (PROZAC) 40 MG capsule Take 1 capsule (40 mg total) by mouth daily. 30 capsule 0   Multiple Vitamin (MULTIVITAMIN WITH MINERALS) TABS Take 1 tablet by mouth daily.     OLANZapine (ZYPREXA) 10 MG tablet Take 10 mg by mouth at bedtime.     propranolol ER (INDERAL LA) 60 MG 24 hr capsule Take 1 capsule (60 mg total) by mouth daily. 30 capsule 0   triamcinolone cream (KENALOG) 0.1 % Apply topically 2 (two) times daily as needed.     Vitamin D, Ergocalciferol, (DRISDOL) 1.25 MG (50000 UNIT) CAPS capsule Take 50,000 Units by mouth once a week.      Musculoskeletal: Strength & Muscle Tone: within normal limits Gait & Station: normal Patient leans: N/A Psychiatric Specialty Exam:  Presentation  General Appearance:  Bizarre  Eye Contact: Fair  Speech: Clear and Coherent  Speech Volume: Normal  Handedness: Right   Mood and Affect  Mood: Euthymic  Affect: Depressed; Blunt; Inappropriate   Thought Process  Thought Processes: Coherent  Descriptions of Associations:Intact  Orientation:Full (Time, Place and Person)  Thought  Content:Paranoid Ideation  History of Schizophrenia/Schizoaffective disorder:Yes  Duration of Psychotic Symptoms:Greater than six months  Hallucinations:Hallucinations: Auditory; Visual Description of Auditory Hallucinations: Voices Description of Visual Hallucinations: See Faces  Ideas of Reference:Paranoia  Suicidal Thoughts:Suicidal Thoughts: No  Homicidal Thoughts:Homicidal Thoughts: Yes, Passive HI Passive Intent and/or Plan: With Intent; With Plan; With Access to Means   Sensorium  Memory: Immediate Fair; Recent Fair; Remote Fair  Judgment: Fair  Insight: Fair   ExMaterials engineerFair  Attention Span: Fair  Recall: [employer] Knowledge: Fair  Language: Fair   Psychomotor Activity  Psychomotor Activity: Psychomotor Activity: Normal   Assets  Assets: Communication Skills; Desire for Improvement; Intimacy; Leisure Time; Physical Health; Resilience; Social Support   Sleep  Sleep: Sleep: Good Number of Hours of Sleep: 8   Physical Exam: Physical Exam Vitals and nursing note reviewed.  Constitutional:      Appearance: Normal appearance. He is normal weight.  HENT:     Head: Normocephalic and atraumatic.     Right Ear: External ear normal.     Left Ear: External ear normal.     Nose: Nose normal.     Mouth/Throat:     Mouth: Mucous membranes are moist.  Cardiovascular:     Rate and Rhythm: Tachycardia present.  Pulmonary:     Effort: Pulmonary effort is normal.  Musculoskeletal:        General: Normal range of motion.     Cervical back: Normal range of motion and neck supple.  Neurological:     General: No focal deficit present.     Mental Status: He is alert and oriented to person, place, and time.  Psychiatric:        Attention and Perception: He perceives auditory and visual hallucinations.        Mood and Affect: Mood is depressed. Affect is blunt.        Speech: Speech is delayed.        Behavior:  Behavior is withdrawn. Behavior is cooperative.        Thought Content: Thought content is paranoid and delusional. Thought content includes homicidal ideation. Thought content includes homicidal plan.        Cognition and Memory: Memory normal. Cognition is impaired.        Judgment: Judgment is impulsive and inappropriate.    ROS Blood pressure 124/74, pulse (!) 109, temperature 99.1 F (37.3 C), temperature source Oral, resp. rate 18, SpO2 98 %. There is no height or weight on file to calculate BMI.  Treatment Plan Summary: Plan   -Patient does meet the criteria for psychiatric inpatient admission  Disposition: Recommend psychiatric Inpatient admission when medically cleared. Supportive therapy provided about ongoing stressors.  Caroline Sauger, NP 03/15/2022 11:43 PM

## 2022-03-15 NOTE — ED Notes (Signed)
Patient belongings continue:  1 white underwear  1 Brian Stafford coat

## 2022-03-15 NOTE — ED Triage Notes (Signed)
Patient to ED IVC'd from group home. Patient cracked window of group home worker's car and has been more paranoid over the past 3 days. Group home states that he has been aggressive towards male staff than male staff. Denies SI or HI.

## 2022-03-15 NOTE — BH Assessment (Signed)
Comprehensive Clinical Assessment (CCA) Note  03/15/2022 Brian BRAGANZA [id number]  Chief Complaint: Patient is a 47 year old male presenting to [facility] ED under IVC. Per triage note Patient to ED IVC'd from group home. Patient cracked window of group home worker's car and has been more paranoid over the past 3 days. Group home states that he has been aggressive towards male staff than male staff. Denies SI or HI. During assessment patient appears alert and oriented x4, calm and cooperative. Patient reports "I had one of my moods, I started running after a car." Patient is able to report that he has been having AH, but he reports "I don't know what the voices are saying" he also reports VH "I see faces." Patient denies SI/HI.  Per Psyc NP Ysidro Evert patient is recommended for Inpatient Chief Complaint  Patient presents with   IVC   Visit Diagnosis: Schizophrenia    CCA Screening, Triage and Referral (STR)  Patient Reported Information How did you hear about [place]? Legal System  Referral name: No data recorded Referral phone number: No data recorded  Whom do you see for routine medical problems? No data recorded Practice/Facility Name: No data recorded Practice/Facility Phone Number: No data recorded Name of Contact: No data recorded Contact Number: No data recorded Contact Fax Number: No data recorded Prescriber Name: No data recorded Prescriber Address (if known): No data recorded  What Is the Reason for Your Visit/Call Today? Patient to ED IVC'd from group home. Patient cracked window of group home worker's car and has been more paranoid over the past 3 days. Group home states that he has been aggressive towards male staff than male staff. Denies SI or HI.  How Long Has This Been Causing You Problems? > than 6 months  What Do You Feel Would Help You the Most Today? No data recorded  Have You Recently Been in Any Inpatient Treatment (Hospital/Detox/Crisis Center/28-Day  Program)? No data recorded Name/Location of Program/Hospital:No data recorded How Long Were You There? No data recorded When Were You Discharged? No data recorded  Have You Ever Received Services From [facility] Before? No data recorded Who Do You See at [facility]? No data recorded  Have You Recently Had Any Thoughts About Hurting Yourself? No  Are You Planning to Commit Suicide/Harm Yourself At This time? No   Have you Recently Had Thoughts About [place]? No  Explanation: No data recorded  Have You Used Any Alcohol or Drugs in the Past 24 Hours? No  How Long Ago Did You Use Drugs or Alcohol? No data recorded What Did You Use and How Much? No data recorded  Do You Currently Have a Therapist/Psychiatrist? Yes  Name of Therapist/Psychiatrist: Unknown   Have You Been Recently Discharged From Any Office Practice or Programs? No  Explanation of Discharge From Practice/Program: No data recorded    CCA Screening Triage Referral Assessment Type of Contact: Face-to-Face  Is this Initial or Reassessment? No data recorded Date Telepsych consult ordered in [facility]:  No data recorded Time Telepsych consult ordered in [facility]:  No data recorded  Patient Reported Information Reviewed? No data recorded Patient Left Without Being Seen? No data recorded Reason for Not Completing Assessment: No data recorded  Collateral Involvement: No data recorded  Does Patient Have a [place]? No data recorded Name and Contact of Legal Guardian: No data recorded If Minor and Not Living with Parent(s), Who has Custody? No data recorded Is CPS involved or  ever been involved? Never  Is APS involved or ever been involved? Never   Patient Determined To Be At Risk for Harm To Self or Others Based on Review of Patient Reported Information or Presenting Complaint? No  Method: No data recorded Availability of Means: No data recorded Intent: No data recorded Notification  Required: No data recorded Additional Information for Danger to Others Potential: No data recorded Additional Comments for Danger to Others Potential: No data recorded Are There Guns or Other Weapons in Your Home? No  Types of Guns/Weapons: No data recorded Are These Weapons Safely Secured?                            No data recorded Who Could Verify You Are Able To Have These Secured: No data recorded Do You Have any Outstanding Charges, Pending Court Dates, Parole/Probation? No data recorded Contacted To Inform of Risk of Harm To Self or Others: No data recorded  Location of Assessment: [facility]   Does Patient Present under Involuntary Commitment? Yes  IVC Papers Initial File Date: No data recorded  [place] of Residence: [place]   Patient Currently Receiving the Following Services: Group Home; Medication Management   Determination of Need: Emergent (2 hours)   Options For Referral: No data recorded    CCA Biopsychosocial Intake/Chief Complaint:  No data recorded Current Symptoms/Problems: No data recorded  Patient Reported Schizophrenia/Schizoaffective Diagnosis in Past: Yes   Strengths: Patient is able to communicate  Preferences: No data recorded Abilities: No data recorded  Type of Services Patient Feels are Needed: No data recorded  Initial Clinical Notes/Concerns: No data recorded  Mental Health Symptoms Depression:   Difficulty Concentrating; Fatigue   Duration of Depressive symptoms:  Greater than two weeks   Mania:   Racing thoughts; Recklessness   Anxiety:    Restlessness; Irritability   Psychosis:   Delusions; Hallucinations   Duration of Psychotic symptoms:  Greater than six months   Trauma:   None   Obsessions:   None   Compulsions:   "Driven" to perform behaviors/acts; Poor Insight   Inattention:   None   Hyperactivity/Impulsivity:   None   Oppositional/Defiant Behaviors:   Temper   Emotional Irregularity:   None    Other Mood/Personality Symptoms:  No data recorded   Mental Status Exam Appearance and self-care  Stature:   Average   Weight:   Average weight   Clothing:   Casual   Grooming:   Normal   Cosmetic use:   None   Posture/gait:   Normal   Motor activity:   Not Remarkable   Sensorium  Attention:   Normal   Concentration:   Normal   Orientation:   X5   Recall/memory:   Normal   Affect and Mood  Affect:   Appropriate   Mood:   Other (Comment)   Relating  Eye contact:   Normal   Facial expression:   Responsive   Attitude toward examiner:   Cooperative   Thought and Language  Speech flow:  Clear and Coherent   Thought content:   Appropriate to Mood and Circumstances   Preoccupation:   None   Hallucinations:   None   Organization:  No data recorded  [employer] of Knowledge:   Fair   Intelligence:   Average   Abstraction:   Functional   Judgement:   Fair   Reality Testing:   Adequate  Insight:   Fair   Decision Making:   Normal   Social Functioning  Social Maturity:   Responsible   Social Judgement:   Normal   Stress  Stressors:   Other (Comment)   Coping Ability:   Normal   Skill Deficits:   None   Supports:   Friends/Service system     Religion: Religion/Spirituality Are You A Religious Person?: No  Leisure/Recreation: Leisure / Recreation Do You Have Hobbies?: No  Exercise/Diet: Exercise/Diet Do You Exercise?: No Have You Gained or Lost A Significant Amount of Weight in the Past Six Months?: No Do You Follow a Special Diet?: No Do You Have Any Trouble Sleeping?: No   CCA Employment/Education Employment/Work Situation: Employment / Work Situation Employment Situation: On disability Why is Patient on Disability: Mental Health How Long has Patient Been on Disability: Unknown Has Patient ever Been in the [employer]?: No  Education: Education Is Patient Currently Attending  School?: No Did You Have An Individualized Education Program (IIEP): No Did You Have Any Difficulty At School?: No Patient's Education Has Been Impacted by Current Illness: No   CCA Family/Childhood History Family and Relationship History: Family history Marital status: Single Does patient have children?: No  Childhood History:  Childhood History Did patient suffer any verbal/emotional/physical/sexual abuse as a child?: No Did patient suffer from severe childhood neglect?: No Has patient ever been sexually abused/assaulted/raped as an adolescent or adult?: No Was the patient ever a victim of a crime or a disaster?: No Witnessed domestic violence?: No Has patient been affected by domestic violence as an adult?: No  Child/Adolescent Assessment:     CCA Substance Use Alcohol/Drug Use: Alcohol / Drug Use Pain Medications: See MAR Prescriptions: See MAR Over the Counter: See MAR History of alcohol / drug use?: No history of alcohol / drug abuse                         ASAM's:  Six Dimensions of Multidimensional Assessment  Dimension 1:  Acute Intoxication and/or Withdrawal Potential:      Dimension 2:  Biomedical Conditions and Complications:      Dimension 3:  Emotional, Behavioral, or Cognitive Conditions and Complications:     Dimension 4:  Readiness to Change:     Dimension 5:  Relapse, Continued use, or Continued Problem Potential:     Dimension 6:  Recovery/Living Environment:     ASAM Severity Score:    ASAM Recommended Level of Treatment:     Substance use Disorder (SUD)    Recommendations for Services/Supports/Treatments:    DSM5 Diagnoses: Patient Active Problem List   Diagnosis Date Noted   New onset seizure ([place]) 123XX123   Acute metabolic encephalopathy 123XX123   Suicidal ideation 12/12/2019   Dehydration 12/12/2019   Seizure ([place]) 12/06/2019   Schizophrenia ([place]) 09/08/2019   Aggression 09/08/2019    Patient Centered  Plan: Patient is on the following Treatment Plan(s):  Impulse Control   Referrals to Alternative Service(s): Referred to Alternative Service(s):   Place:   Date:   Time:    Referred to Alternative Service(s):   Place:   Date:   Time:    Referred to Alternative Service(s):   Place:   Date:   Time:    Referred to Alternative Service(s):   Place:   Date:   Time:      @BHCOLLABOFCARE$ @  [employer], LCAS-A

## 2022-03-15 NOTE — Consult Note (Signed)
Pharmacy Consult - Clozapine     47 yo male ordered clozapine 300 mg PO AM and 195m PO HS  This patient's order has been reviewed for prescribing contraindications.    Clozapine [facility] enrollment Verified: yes on 04/15/2020 [facility] patient ID: [id number] Outpatient Monitoring: Monthly    Home Regimen:  300 mg PO every morning and 1016mPO every evening Last dose: 30088mO 2/13 at 0800   Dose Adjustments This Admission: N/A   Labs: Date    ANC    Submitted? 2/13 4600 Yes       Plan: Continue current regimen Monitor ANC at least weekly while inpatient

## 2022-03-15 NOTE — ED Notes (Signed)
Pt reports he got into an argument with caregiver at group home.  Pt states I just get out of control.  Pt reports trying to break a window in the [place] at at group home.  Pt denies getting hurt.  Pt denies SI or HI.   Pt denies drugs or etoh use.    Pt calm and cooperative at this time in hallway bed.

## 2022-03-15 NOTE — Consult Note (Incomplete)
[facility] Consult   Reason for Consult:IVC  Referring Physician: Dr. Ellender Hose Patient Identification: Brian Stafford MRN:  [id number] Principal Diagnosis: <principal problem not specified> Diagnosis:  Active Problems:   Schizophrenia [facility])   Aggression   New onset seizure ([place])   Suicidal ideation   Total Time spent with patient: 1 hour  Subjective: " I had one of my moods." Brian Stafford is a 47 y.o. male patient presented to [facility] ED****. The patient was seen face-to-face by this provider; chart reviewed and consulted with Dr. ED providers and Dr. [name] who ever doc on call on 02/00/2024 due to the care of the patient. It was discussed with both providers that the patient does/does not meet the criteria to be admitted to the psychiatric inpatient unit.  On evaluation the patient reports....  During his/her evaluation, @NAME$  @ is alert and oriented x4, calm, cooperative, and mood-congruent with affect.  The patient does not appear to be responding to internal or external stimuli. Neither is the patient presenting with any delusional thinking. The patient denies auditory or visual hallucinations. The patient denies any suicidal, homicidal, or self-harm ideations. The patient is not presenting with any psychotic or paranoid behaviors. During an encounter with the patient, he/she was able to answer questions appropriately.  HPI: Per Dr. Leida Lauth is a 47 y.o. male  here with IVC. Pt has schizophrenia and is here under IVC from group home. Pt reportedly cracked window of group home worker's car and has bee more paranoid. He has been aggressive toward male staff. Unknown if he has been taking his medications. He is somewhat evasive on my exam.   Past Psychiatric History:  Anxiety Bipolar 1 disorder ([place]) Mental retardation Schizophrenia ([place])   Risk to Self:   Risk to Others:   Prior Inpatient Therapy:   Prior Outpatient Therapy:    Past Medical  History:  Past Medical History:  Diagnosis Date  . Anxiety   . Bipolar 1 disorder ([place])   . Mental retardation   . Schizophrenia ([place])    History reviewed. No pertinent surgical history. Family History:  Family History  Family history unknown: Yes   Family Psychiatric  History: History reviewed. No pertinent family psychiatric history  Social History:  Social History   Substance and Sexual Activity  Alcohol Use Not Currently   Comment: UTA     Social History   Substance and Sexual Activity  Drug Use Not Currently   Comment: UTA    Social History   Socioeconomic History  . Marital status: Single    Spouse name: Not on file  . Number of children: Not on file  . Years of education: Not on file  . Highest education level: Not on file  Occupational History  . Not on file  Tobacco Use  . Smoking status: Unknown  . Smokeless tobacco: Never  . Tobacco comments:    UTA  Vaping Use  . Vaping Use: Unknown  Substance and Sexual Activity  . Alcohol use: Not Currently    Comment: UTA  . Drug use: Not Currently    Comment: UTA  . Sexual activity: Not on file    Comment: UTA  Other Topics Concern  . Not on file  Social History Narrative  . Not on file   Social Determinants of Health   Financial Resource Strain: Not on file  Food Insecurity: Not on file  Transportation Needs: Not on file  Physical Activity: Not on file  Stress: Not on file  Social Connections: Not on file   Additional Social History:    Allergies:  No Known Allergies  Labs:  Results for orders placed or performed during the hospital encounter of 03/15/22 (from the past 48 hour(s))  Urine Drug Screen, Qualitative     Status: Abnormal   Collection Time: 03/15/22  4:15 PM  Result Value Ref Range   Tricyclic, Ur Screen POSITIVE (A) NONE DETECTED   Amphetamines, Ur Screen NONE DETECTED NONE DETECTED   MDMA (Ecstasy)Ur Screen NONE DETECTED NONE DETECTED   Cocaine Metabolite,Ur [place] NONE DETECTED  NONE DETECTED   Opiate, Ur Screen NONE DETECTED NONE DETECTED   Phencyclidine (PCP) Ur S NONE DETECTED NONE DETECTED   Cannabinoid 50 Ng, Ur [place] NONE DETECTED NONE DETECTED   Barbiturates, Ur Screen NONE DETECTED NONE DETECTED   Benzodiazepine, Ur Scrn NONE DETECTED NONE DETECTED   Methadone Scn, Ur NONE DETECTED NONE DETECTED    Comment: (NOTE) Tricyclics + metabolites, urine    Cutoff 1000 ng/mL Amphetamines + metabolites, urine  Cutoff 1000 ng/mL MDMA (Ecstasy), urine              Cutoff 500 ng/mL Cocaine Metabolite, urine          Cutoff 300 ng/mL Opiate + metabolites, urine        Cutoff 300 ng/mL Phencyclidine (PCP), urine         Cutoff 25 ng/mL Cannabinoid, urine                 Cutoff 50 ng/mL Barbiturates + metabolites, urine  Cutoff 200 ng/mL Benzodiazepine, urine              Cutoff 200 ng/mL Methadone, urine                   Cutoff 300 ng/mL  The urine drug screen provides only a preliminary, unconfirmed analytical test result and should not be used for non-medical purposes. Clinical consideration and professional judgment should be applied to any positive drug screen result due to possible interfering substances. A more specific alternate chemical method must be used in order to obtain a confirmed analytical result. Gas chromatography / mass spectrometry (GC/MS) is the preferred confirm atory method. Performed at [facility], [address] [place], [place] [address]   Comprehensive metabolic panel     Status: Abnormal   Collection Time: 03/15/22  4:32 PM  Result Value Ref Range   Sodium 138 135 - 145 mmol/L   Potassium 3.3 (L) 3.5 - 5.1 mmol/L   Chloride 105 98 - 111 mmol/L   CO2 21 (L) 22 - 32 mmol/L   Glucose, Bld 111 (H) 70 - 99 mg/dL    Comment: Glucose reference range applies only to samples taken after fasting for at least 8 hours.   BUN 18 6 - 20 mg/dL   Creatinine, Ser 1.33 (H) 0.61 - 1.24 mg/dL   Calcium 9.2 8.9 - 10.3 mg/dL   Total  Protein 7.6 6.5 - 8.1 g/dL   Albumin 4.0 3.5 - 5.0 g/dL   AST 46 (H) 15 - 41 U/L   ALT 29 0 - 44 U/L   Alkaline Phosphatase 72 38 - 126 U/L   Total Bilirubin 0.5 0.3 - 1.2 mg/dL   GFR, Estimated >60 >60 mL/min    Comment: (NOTE) Calculated using the CKD-EPI Creatinine Equation (2021)    Anion gap 12 5 - 15    Comment: Performed at [facility],  [place], [place] [address]  Ethanol     Status: None   Collection Time: 03/15/22  4:32 PM  Result Value Ref Range   Alcohol, Ethyl (B) <10 <10 mg/dL    Comment: (NOTE) Lowest detectable limit for serum alcohol is 10 mg/dL.  For medical purposes only. Performed at [facility], [address] [place], [place] [id number]   Salicylate level     Status: Abnormal   Collection Time: 03/15/22  4:32 PM  Result Value Ref Range   Salicylate Lvl Q000111Q (L) 7.0 - 30.0 mg/dL    Comment: Performed at [facility], [address] [place], [place] [address]  Acetaminophen level     Status: Abnormal   Collection Time: 03/15/22  4:32 PM  Result Value Ref Range   Acetaminophen (Tylenol), Serum <10 (L) 10 - 30 ug/mL    Comment: (NOTE) Therapeutic concentrations vary significantly. A range of 10-30 ug/mL  may be an effective concentration for many patients. However, some  are best treated at concentrations outside of this range. Acetaminophen concentrations >150 ug/mL at 4 hours after ingestion  and >50 ug/mL at 12 hours after ingestion are often associated with  toxic reactions.  Performed at [facility], [address] [place], [place] [address]   cbc     Status: None   Collection Time: 03/15/22  4:32 PM  Result Value Ref Range   WBC 7.3 4.0 - 10.5 K/uL   RBC 4.96 4.22 - 5.81 MIL/uL   Hemoglobin 13.5 13.0 - 17.0 g/dL   HCT 41.7 39.0 - 52.0 %   MCV 84.1 80.0 - 100.0 fL   MCH 27.2 26.0 - 34.0 pg   MCHC 32.4 30.0 - 36.0 g/dL   RDW 12.4 11.5 - 15.5 %   Platelets 253 150 - 400 K/uL   nRBC  0.0 0.0 - 0.2 %    Comment: Performed at [facility], [address] [place], [place] [address]  CBC with Differential/Platelet     Status: Abnormal   Collection Time: 03/15/22  8:45 PM  Result Value Ref Range   WBC 7.3 4.0 - 10.5 K/uL   RBC 4.63 4.22 - 5.81 MIL/uL   Hemoglobin 12.8 (L) 13.0 - 17.0 g/dL   HCT 39.1 39.0 - 52.0 %   MCV 84.4 80.0 - 100.0 fL   MCH 27.6 26.0 - 34.0 pg   MCHC 32.7 30.0 - 36.0 g/dL   RDW 12.5 11.5 - 15.5 %   Platelets 248 150 - 400 K/uL   nRBC 0.0 0.0 - 0.2 %   Neutrophils Relative % 64 %   Neutro Abs 4.6 1.7 - 7.7 K/uL   Lymphocytes Relative 24 %   Lymphs Abs 1.8 0.7 - 4.0 K/uL   Monocytes Relative 12 %   Monocytes Absolute 0.9 0.1 - 1.0 K/uL   Eosinophils Relative 0 %   Eosinophils Absolute 0.0 0.0 - 0.5 K/uL   Basophils Relative 0 %   Basophils Absolute 0.0 0.0 - 0.1 K/uL   Immature Granulocytes 0 %   Abs Immature Granulocytes 0.02 0.00 - 0.07 K/uL    Comment: Performed at [facility], [address], [place], [place] [address]    Current Facility-Administered Medications  Medication Dose Route Frequency Provider Last Rate Last Admin  . acetaminophen (TYLENOL) tablet 650 mg  650 mg Oral Q4H PRN Duffy Bruce, MD      . benztropine (COGENTIN) tablet 2 mg  2 mg Oral  Daily Duffy Bruce, MD   2 mg at 03/15/22 2020  . cloZAPine (CLOZARIL) tablet 100 mg  100 mg Oral QHS Darrick Penna, RPH   100 mg at 03/15/22 2155  . [START ON 03/16/2022] cloZAPine (CLOZARIL) tablet 300 mg  300 mg Oral q morning Darrick Penna, [facility]      . divalproex (DEPAKOTE ER) 24 hr tablet 1,000 mg  1,000 mg Oral QHS Duffy Bruce, MD   1,000 mg at 03/15/22 2115  . [START ON 03/16/2022] FLUoxetine (PROZAC) capsule 40 mg  40 mg Oral Daily Duffy Bruce, MD      . OLANZapine [facility]) tablet 10 mg  10 mg Oral Gloriajean Dell, MD   10 mg at 03/15/22 2116  . [START ON 03/16/2022] propranolol ER (INDERAL LA) 24 hr capsule 60 mg  60 mg Oral Daily  Duffy Bruce, MD      . Vitamin D (Ergocalciferol) (DRISDOL) 1.25 MG (50000 UNIT) capsule 50,000 Units  50,000 Units Oral Weekly Duffy Bruce, MD   50,000 Units at 03/15/22 2116   Current Outpatient Medications  Medication Sig Dispense Refill  . benztropine (COGENTIN) 1 MG tablet Take 1 mg by mouth daily.    . benztropine (COGENTIN) 2 MG tablet Take 1 tablet (2 mg total) by mouth daily. 30 tablet 0  . cloZAPine (CLOZARIL) 100 MG tablet Take 100-300 mg by mouth in the morning and at bedtime. 328m qam and 1094mqpm    . divalproex (DEPAKOTE ER) 500 MG 24 hr tablet Take 1,000 mg by mouth at bedtime.    . Marland KitchenLUoxetine (PROZAC) 40 MG capsule Take 1 capsule (40 mg total) by mouth daily. 30 capsule 0  . Multiple Vitamin (MULTIVITAMIN WITH MINERALS) TABS Take 1 tablet by mouth daily.    . Marland KitchenLANZapine (ZYPREXA) 10 MG tablet Take 10 mg by mouth at bedtime.    . propranolol ER (INDERAL LA) 60 MG 24 hr capsule Take 1 capsule (60 mg total) by mouth daily. 30 capsule 0  . triamcinolone cream (KENALOG) 0.1 % Apply topically 2 (two) times daily as needed.    . Vitamin D, Ergocalciferol, (DRISDOL) 1.25 MG (50000 UNIT) CAPS capsule Take 50,000 Units by mouth once a week.      Musculoskeletal: Strength & Muscle Tone: within normal limits Gait & Station: normal Patient leans: N/A Psychiatric Specialty Exam:  Presentation  General Appearance:  Bizarre  Eye Contact: Fair  Speech: Clear and Coherent  Speech Volume: Normal  Handedness: Right   Mood and Affect  Mood: Euthymic  Affect: Depressed; Blunt; Inappropriate   Thought Process  Thought Processes: Coherent  Descriptions of Associations:Intact  Orientation:Full (Time, Place and Person)  Thought Content:Paranoid Ideation  History of Schizophrenia/Schizoaffective disorder:Yes  Duration of Psychotic Symptoms:Greater than six months  Hallucinations:Hallucinations: Auditory; Visual Description of Auditory Hallucinations:  Voices Description of Visual Hallucinations: See Faces  Ideas of Reference:Paranoia  Suicidal Thoughts:Suicidal Thoughts: No  Homicidal Thoughts:Homicidal Thoughts: Yes, Passive HI Passive Intent and/or Plan: With Intent; With Plan; With Access to Means   Sensorium  Memory: Immediate Fair; Recent Fair; Remote Fair  Judgment: Fair  Insight: Fair   ExMaterials engineerFair  Attention Span: Fair  Recall: [employer] Knowledge: Fair  Language: Fair   Psychomotor Activity  Psychomotor Activity: Psychomotor Activity: Normal   Assets  Assets: CoArmed forces logistics/support/administrative officerDesire for Improvement; Intimacy; Leisure Time; Physical Health; Resilience; Social Support   Sleep  Sleep: Sleep: Good Number of Hours of Sleep: 8  Physical Exam: Physical Exam Vitals and nursing note reviewed.  Constitutional:      Appearance: Normal appearance. He is normal weight.  HENT:     Head: Normocephalic and atraumatic.     Right Ear: External ear normal.     Left Ear: External ear normal.     Nose: Nose normal.     Mouth/Throat:     Mouth: Mucous membranes are moist.  Cardiovascular:     Rate and Rhythm: Tachycardia present.  Pulmonary:     Effort: Pulmonary effort is normal.  Musculoskeletal:        General: Normal range of motion.     Cervical back: Normal range of motion and neck supple.  Neurological:     General: No focal deficit present.     Mental Status: He is alert and oriented to person, place, and time.  Psychiatric:        Attention and Perception: He perceives auditory and visual hallucinations.        Mood and Affect: Mood is depressed. Affect is blunt.        Speech: Speech is delayed.        Behavior: Behavior is withdrawn. Behavior is cooperative.        Thought Content: Thought content is paranoid and delusional. Thought content includes homicidal ideation. Thought content includes homicidal plan.        Cognition and Memory: Memory  normal. Cognition is impaired.        Judgment: Judgment is impulsive and inappropriate.    ROS Blood pressure 124/74, pulse (!) 109, temperature 99.1 F (37.3 C), temperature source Oral, resp. rate 18, SpO2 98 %. There is no height or weight on file to calculate BMI.  Treatment Plan Summary: Plan   -Patient does meet the criteria for psychiatric inpatient admission  Disposition: Recommend psychiatric Inpatient admission when medically cleared. Supportive therapy provided about ongoing stressors.  Caroline Sauger, NP 03/15/2022 11:43 PM

## 2022-03-15 NOTE — ED Notes (Signed)
IVC pending consult   

## 2022-03-15 NOTE — ED Notes (Signed)
Patients belongings  A pair of black sneakers A pair of white socks A blue long sleeve dress shirt A grey t-shirt A pair of khaki pants A black belt

## 2022-03-16 DIAGNOSIS — R45851 Suicidal ideations: Secondary | ICD-10-CM | POA: Diagnosis not present

## 2022-03-16 DIAGNOSIS — F200 Paranoid schizophrenia: Secondary | ICD-10-CM | POA: Diagnosis not present

## 2022-03-16 DIAGNOSIS — R569 Unspecified convulsions: Secondary | ICD-10-CM | POA: Diagnosis not present

## 2022-03-16 DIAGNOSIS — F209 Schizophrenia, unspecified: Secondary | ICD-10-CM | POA: Diagnosis not present

## 2022-03-16 DIAGNOSIS — R000 Tachycardia, unspecified: Secondary | ICD-10-CM | POA: Diagnosis not present

## 2022-03-16 DIAGNOSIS — F29 Unspecified psychosis not due to a substance or known physiological condition: Secondary | ICD-10-CM | POA: Diagnosis not present

## 2022-03-16 DIAGNOSIS — R457 State of emotional shock and stress, unspecified: Secondary | ICD-10-CM | POA: Diagnosis not present

## 2022-03-16 NOTE — ED Notes (Signed)
Transferred to [facility] via [place]. 2 bags of property sent with. Pt alert and oriented X4, cooperative, RR even and unlabored, color WNL. Pt in NAD.

## 2022-03-16 NOTE — ED Notes (Signed)
Report from amy, rn.

## 2022-03-16 NOTE — ED Notes (Signed)
Hospital meal provided.  100% consumed, pt tolerated w/o complaints.  Waste discarded appropriately.   

## 2022-03-16 NOTE — ED Notes (Signed)
Breakfast provided.

## 2022-03-16 NOTE — BH Assessment (Signed)
PATIENT BED AVAILABLE AFTER 11AM ON 03/16/22  Patient has been accepted to [facility].  Accepting physician is Dr. Donneta Romberg.  Call report to [phone].  Representative was [employer].   ER Staff is aware of it:  [facility] ER Secretary  Dr. Jari Pigg, ER MD  Amy Patient's Nurse

## 2022-03-16 NOTE — ED Provider Notes (Signed)
Emergency Medicine Observation Re-evaluation Note  Brian Stafford is a 47 y.o. male, seen on rounds today.  Pt initially presented to the ED for complaints of IVC  Currently, the patient is no acute distress. Pt resting  Physical Exam  Blood pressure 124/74, pulse (!) 109, temperature 99.1 F (37.3 C), temperature source Oral, resp. rate 18, SpO2 98 %.  Physical Exam General: No apparent distress Pulm: Normal WOB Psych: resting     ED Course / MDM     I have reviewed the labs performed to date as well as medications administered while in observation.  Recent changes in the last 24 hours include none  Plan    Patient is under full IVC at this time.  PATIENT BED AVAILABLE AFTER 11AM ON 03/16/22  Patient has been accepted to [facility]  Accepting physician is Dr. Donneta Romberg.     Vanessa [place], MD 03/16/22 [phone]

## 2022-03-16 NOTE — ED Notes (Signed)
PATIENT BED AVAILABLE AFTER 11AM ON 03/16/22  Patient has been accepted to [facility].  Accepting physician is Dr. Donneta Romberg.  Call report to [phone].  Representative was [employer].

## 2022-03-16 NOTE — BH Assessment (Signed)
Per [place] Irine Seal), patient to be referred out of system.  Referral information for Psychiatric Hospitalization faxed to;   [facility] [phone]- [phone]) no appropriate beds available  [facility] [phone]- [phone]),   [facility] [phone]),  [address] [phone]),   [place] [facility] [phone] -or- [phone]),   [facility] [phone])  [facility] [phone])

## 2022-03-16 NOTE — ED Notes (Signed)
REPORT TO ALLY, RN.

## 2022-03-17 DIAGNOSIS — F29 Unspecified psychosis not due to a substance or known physiological condition: Secondary | ICD-10-CM | POA: Diagnosis not present

## 2022-04-18 DIAGNOSIS — R69 Illness, unspecified: Secondary | ICD-10-CM | POA: Diagnosis not present

## 2022-06-13 ENCOUNTER — Ambulatory Visit (INDEPENDENT_AMBULATORY_CARE_PROVIDER_SITE_OTHER): Payer: Medicare HMO | Admitting: Podiatry

## 2022-06-13 VITALS — BP 132/73

## 2022-06-13 DIAGNOSIS — B351 Tinea unguium: Secondary | ICD-10-CM

## 2022-06-13 DIAGNOSIS — M79674 Pain in right toe(s): Secondary | ICD-10-CM

## 2022-06-13 DIAGNOSIS — M79675 Pain in left toe(s): Secondary | ICD-10-CM | POA: Diagnosis not present

## 2022-06-13 NOTE — Progress Notes (Unsigned)
Subjective: Brian Stafford presents today referred by Fleet Contras, MD for complaint of {jgcomplaint:23593}.   Past Medical History:  Diagnosis Date   Anxiety    Bipolar 1 disorder (HCC)    Mental retardation    Schizophrenia [facility])      Patient Active Problem List   Diagnosis Date Noted   New onset seizure (HCC) 12/12/2019   Acute metabolic encephalopathy 12/12/2019   Suicidal ideation 12/12/2019   Dehydration 12/12/2019   Seizure (HCC) 12/06/2019   Schizophrenia (HCC) 09/08/2019   Aggression 09/08/2019     No past surgical history on file.   Current Outpatient Medications on File Prior to Visit  Medication Sig Dispense Refill   benztropine (COGENTIN) 1 MG tablet Take 1 mg by mouth daily.     benztropine (COGENTIN) 2 MG tablet Take 1 tablet (2 mg total) by mouth daily. 30 tablet 0   cloZAPine (CLOZARIL) 100 MG tablet Take 100-300 mg by mouth in the morning and at bedtime. 300mg  qam and 100mg  qpm     divalproex (DEPAKOTE ER) 500 MG 24 hr tablet Take 1,000 mg by mouth at bedtime.     FLUoxetine (PROZAC) 40 MG capsule Take 1 capsule (40 mg total) by mouth daily. 30 capsule 0   Multiple Vitamin (MULTIVITAMIN WITH MINERALS) TABS Take 1 tablet by mouth daily.     OLANZapine (ZYPREXA) 10 MG tablet Take 10 mg by mouth at bedtime.     propranolol ER (INDERAL LA) 60 MG 24 hr capsule Take 1 capsule (60 mg total) by mouth daily. 30 capsule 0   triamcinolone cream (KENALOG) 0.1 % Apply topically 2 (two) times daily as needed.     Vitamin D, Ergocalciferol, (DRISDOL) 1.25 MG (50000 UNIT) CAPS capsule Take 50,000 Units by mouth once a week.     No current facility-administered medications on file prior to visit.     No Known Allergies   Social History   Occupational History   Not on file  Tobacco Use   Smoking status: Unknown   Smokeless tobacco: Never   Tobacco comments:    UTA  Vaping Use   Vaping Use: Unknown  Substance and Sexual Activity   Alcohol use: Not Currently     Comment: UTA   Drug use: Not Currently    Comment: UTA   Sexual activity: Not on file    Comment: UTA     Family History  Family history unknown: Yes      There is no immunization history on file for this patient.   Objective: There were no vitals filed for this visit.  Brian Stafford is a pleasant 47 y.o. male {jgbodyhabitus:24098} AAO x 3.  Vascular Examination: Vascular status intact b/l with palpable pedal pulses. Pedal hair present b/l. CFT immediate b/l. No edema. No pain with calf compression b/l. Skin temperature gradient WNL b/l. {jgvascular:23595}  Neurological Examination: Sensation grossly intact b/l with 10 gram monofilament. Vibratory sensation intact b/l. {jgneuro:23601::"Protective sensation intact 5/5 intact bilaterally with 10g monofilament b/l.","Vibratory sensation intact b/l.","Proprioception intact bilaterally."}  Dermatological Examination: Pedal skin with normal turgor, texture and tone b/l. Toenails 1-5 b/l thick, discolored, elongated with subungual debris and pain on dorsal palpation. No hyperkeratotic lesions noted b/l. {jgderm:23598}  Musculoskeletal Examination: Muscle strength 5/5 to b/l LE. {jgmsk:23600}  Radiographs: None  {jgxrayfindings:23683}  Assessment: No diagnosis found.   Plan: {Jgplan:23602::"-Patient/POA to call should there be question/concern in the interim."}  Return in about 3 months (around 09/13/2022).  Freddie Breech, DPM

## 2022-06-15 ENCOUNTER — Encounter: Payer: Self-pay | Admitting: Podiatry

## 2022-06-23 DIAGNOSIS — H18413 Arcus senilis, bilateral: Secondary | ICD-10-CM | POA: Diagnosis not present

## 2022-06-23 DIAGNOSIS — E083293 Diabetes mellitus due to underlying condition with mild nonproliferative diabetic retinopathy without macular edema, bilateral: Secondary | ICD-10-CM | POA: Diagnosis not present

## 2022-06-23 DIAGNOSIS — H35033 Hypertensive retinopathy, bilateral: Secondary | ICD-10-CM | POA: Diagnosis not present

## 2022-06-23 DIAGNOSIS — E119 Type 2 diabetes mellitus without complications: Secondary | ICD-10-CM | POA: Diagnosis not present

## 2022-06-23 DIAGNOSIS — H43813 Vitreous degeneration, bilateral: Secondary | ICD-10-CM | POA: Diagnosis not present

## 2022-07-25 ENCOUNTER — Emergency Department (HOSPITAL_COMMUNITY): Payer: Medicare HMO

## 2022-07-25 ENCOUNTER — Emergency Department (HOSPITAL_COMMUNITY)
Admission: EM | Admit: 2022-07-25 | Discharge: 2022-07-26 | Disposition: A | Discharge status: Home / Self Care | Payer: Medicare HMO | Attending: Emergency Medicine | Admitting: Emergency Medicine

## 2022-07-25 ENCOUNTER — Encounter (HOSPITAL_COMMUNITY): Payer: Self-pay

## 2022-07-25 ENCOUNTER — Other Ambulatory Visit: Payer: Self-pay

## 2022-07-25 DIAGNOSIS — R404 Transient alteration of awareness: Secondary | ICD-10-CM | POA: Diagnosis not present

## 2022-07-25 DIAGNOSIS — F79 Unspecified intellectual disabilities: Secondary | ICD-10-CM | POA: Insufficient documentation

## 2022-07-25 DIAGNOSIS — F259 Schizoaffective disorder, unspecified: Secondary | ICD-10-CM | POA: Diagnosis not present

## 2022-07-25 DIAGNOSIS — R4182 Altered mental status, unspecified: Secondary | ICD-10-CM | POA: Diagnosis not present

## 2022-07-25 DIAGNOSIS — F250 Schizoaffective disorder, bipolar type: Secondary | ICD-10-CM

## 2022-07-25 DIAGNOSIS — R000 Tachycardia, unspecified: Secondary | ICD-10-CM | POA: Diagnosis not present

## 2022-07-25 DIAGNOSIS — F419 Anxiety disorder, unspecified: Secondary | ICD-10-CM | POA: Diagnosis not present

## 2022-07-25 DIAGNOSIS — F69 Unspecified disorder of adult personality and behavior: Secondary | ICD-10-CM | POA: Diagnosis not present

## 2022-07-25 LAB — BASIC METABOLIC PANEL
Anion gap: 11 (ref 5–15)
BUN: 18 mg/dL (ref 6–20)
CO2: 19 mmol/L — ABNORMAL LOW (ref 22–32)
Calcium: 9 mg/dL (ref 8.9–10.3)
Chloride: 104 mmol/L (ref 98–111)
Creatinine, Ser: 1.36 mg/dL — ABNORMAL HIGH (ref 0.61–1.24)
GFR, Estimated: >60 mL/min (ref >60)
Glucose, Bld: 96 mg/dL (ref 70–99)
Potassium: 3.5 mmol/L (ref 3.5–5.1)
Sodium: 134 mmol/L — ABNORMAL LOW (ref 135–145)

## 2022-07-25 LAB — ACETAMINOPHEN LEVEL: Acetaminophen (Tylenol), Serum: <10 ug/mL — ABNORMAL LOW (ref 10–30)

## 2022-07-25 LAB — CBC
HCT: 38.6 % — ABNORMAL LOW (ref 39.0–52.0)
Hemoglobin: 12.5 g/dL — ABNORMAL LOW (ref 13.0–17.0)
MCH: 27 pg (ref 26.0–34.0)
MCHC: 32.4 g/dL (ref 30.0–36.0)
MCV: 83.4 fL (ref 80.0–100.0)
Platelets: 232 10*3/uL (ref 150–400)
RBC: 4.63 MIL/uL (ref 4.22–5.81)
RDW: 13.8 % (ref 11.5–15.5)
WBC: 5 10*3/uL (ref 4.0–10.5)
nRBC: 0 % (ref 0.0–0.2)

## 2022-07-25 LAB — URINALYSIS, ROUTINE W REFLEX MICROSCOPIC
Bacteria, UA: NONE SEEN
Bilirubin Urine: NEGATIVE
Glucose, UA: NEGATIVE mg/dL
Hgb urine dipstick: NEGATIVE
Ketones, ur: 5 mg/dL — AB
Leukocytes,Ua: NEGATIVE
Nitrite: NEGATIVE
Protein, ur: 30 mg/dL — AB
Specific Gravity, Urine: 1.026 (ref 1.005–1.030)
pH: 5 (ref 5.0–8.0)

## 2022-07-25 LAB — LACTIC ACID, PLASMA
Lactic Acid, Venous: 2.2 mmol/L (ref 0.5–1.9)
Lactic Acid, Venous: 1.5 mmol/L (ref 0.5–1.9)

## 2022-07-25 LAB — CBG MONITORING, ED: Glucose-Capillary: 96 mg/dL (ref 70–99)

## 2022-07-25 LAB — SALICYLATE LEVEL: Salicylate Lvl: <7 mg/dL — ABNORMAL LOW (ref 7.0–30.0)

## 2022-07-25 LAB — ETHANOL: Alcohol, Ethyl (B): <10 mg/dL (ref <10)

## 2022-07-25 MED ORDER — SODIUM CHLORIDE 0.9 % IV BOLUS
1000.0000 mL | Freq: Once | INTRAVENOUS | Status: AC
  Administered 2022-07-25: 1000 mL via INTRAVENOUS

## 2022-07-25 MED ORDER — SODIUM CHLORIDE 0.9 % IV BOLUS
500.0000 mL | Freq: Once | INTRAVENOUS | Status: AC
  Administered 2022-07-25: 500 mL via INTRAVENOUS

## 2022-07-25 MED ORDER — LORAZEPAM 2 MG/ML IJ SOLN
INTRAMUSCULAR | Status: AC
  Administered 2022-07-25: 1 mg via INTRAVENOUS
  Filled 2022-07-25: qty 1

## 2022-07-25 MED ORDER — LORAZEPAM 2 MG/ML IJ SOLN: 1.0000 mg | Freq: Once | INTRAMUSCULAR | Status: AC

## 2022-07-25 NOTE — ED Notes (Signed)
Pt getting TTS at this time ?

## 2022-07-25 NOTE — ED Provider Notes (Signed)
  Physical Exam  BP 104/87 (BP Location: Left Arm)   Pulse (!) 109   Temp 98 F (36.7 C) (Oral)   Resp 12   Ht 6\' 3"  (1.905 m)   Wt 93 kg   SpO2 98%   BMI 25.62 kg/m   Physical Exam Constitutional:      General: He is not in acute distress.    Appearance: Normal appearance.  HENT:     Head: Normocephalic and atraumatic.     Nose: No congestion or rhinorrhea.  Eyes:     General:        Right eye: No discharge.        Left eye: No discharge.     Extraocular Movements: Extraocular movements intact.     Pupils: Pupils are equal, round, and reactive to light.  Cardiovascular:     Rate and Rhythm: Normal rate and regular rhythm.     Heart sounds: No murmur heard. Pulmonary:     Effort: No respiratory distress.     Breath sounds: No wheezing or rales.  Abdominal:     General: There is no distension.     Tenderness: There is no abdominal tenderness.  Musculoskeletal:        General: Normal range of motion.     Cervical back: Normal range of motion.  Skin:    General: Skin is warm and dry.  Neurological:     General: No focal deficit present.     Mental Status: He is alert.     Procedures  Procedures  ED Course / MDM   Clinical Course as of 07/25/22 2328  Mon Jul 25, 2022  1658 Spoke to Meyer Cory who is a Merchandiser, retail at his place of residence at [employer] in [place], [place]. Her number is [phone].  She states that today during his group session he was notably agitated.  She reports that he was concerned that there were things "crawling all over him".  She denies any other medical complaints for him.  She also mention that he has been hallucinating more frequently, primarily visual hallucinations. [JR]    Clinical Course User Index [JR] Gareth Eagle, PA-C   Medical Decision Making Amount and/or Complexity of Data Reviewed Labs: ordered. Radiology: ordered.  Risk Prescription drug management.   Spoke with telepsychiatry provider.  Patient has  returned to normal mental status baseline and has agreed to take his psychiatric medications.  Patient has not taken his psychiatric medications in his group home and this likely precipitated his behavior.  Currently not psychotic and psychiatry is recommending discharge back to his group home.       Glendora Score, MD 07/25/22 2329

## 2022-07-25 NOTE — BH Assessment (Signed)
This consult has been deferred to Brian Stafford. The Brian Stafford Coordinator is [name] at [phone]. [name], DO, will complete consult at 11pm and will notify in this chat when consult is complete. Secure chat started with Brian Stafford Coordination Team.

## 2022-07-25 NOTE — ED Notes (Signed)
Attempted to obtain 12 lead from pt Pt pulling cords off Looking off and not listening to RN  With additional staff, obtained 12 lead BGL checked WNL IV access established, pt pushing at RN. Pt steering off and not responding to RN Additional staff assisted in securing IV Pt kept pulling at penis and asked to use the bathroom Verbal from MD for 1mg  IV ativan Medicated, assisted with urinal Fluids started.

## 2022-07-25 NOTE — ED Triage Notes (Signed)
Pt BIB [facility] from group home, name of group home unknown at this time.   Staff reported to EMS that pt has been having abnormal behavior since this morning, has not had daily medications. Has been having "tics", staring off into space.   EMS denies pt violence. Reports repetitive movements and noises. Pt calm and cooperative during triage. Denies pain.   Staff to be reached at [phone].

## 2022-07-25 NOTE — ED Provider Notes (Cosign Needed Addendum)
[facility] AT [facility] Provider Note   CSN: [id number] Arrival date & time: 07/25/22  1151     History  Chief Complaint  Patient presents with   Altered Mental Status   HPI Brian Stafford is a 47 y.o. male with schizophrenia and bipolar 1 disorder presenting for altered mental status.  Was brought by EMS from a group home.  Unknown name of the group home.  EMS states that he is having abnormal behavior which started this morning.  Had not taken his daily medications.  Staff noted that he has been having "tics" and staring off in space.  Patient himself denies any medical problems.  Denies chest pain, headache, abdominal pain and dysuria.  Patient reluctant to give any other details.  Denies calf tenderness and recent mobilization. Patient later mentioned that he "saw faces" in his room last night.  Denies HI/SI.   Altered Mental Status      Home Medications Prior to Admission medications   Medication Sig Start Date End Date Taking? Authorizing Provider  acetaminophen (TYLENOL) 325 MG tablet Take 650 mg by mouth every 4 (four) hours as needed.    [provider]  benztropine (COGENTIN) 1 MG tablet Take 1 mg by mouth daily. 03/01/22   [provider]  benztropine (COGENTIN) 2 MG tablet Take 1 tablet (2 mg total) by mouth daily. 12/04/19   Pricilla Loveless, MD  cloZAPine (CLOZARIL) 100 MG tablet Take 100-300 mg by mouth in the morning and at bedtime. 300mg  qam and 100mg  qpm 02/28/20   [provider]  divalproex (DEPAKOTE ER) 500 MG 24 hr tablet Take 1,000 mg by mouth at bedtime. 03/20/20   [provider]  FLUoxetine (PROZAC) 40 MG capsule Take 1 capsule (40 mg total) by mouth daily. 12/04/19   Pricilla Loveless, MD  guaiFENesin (ROBITUSSIN) 100 MG/5ML liquid Take 5 mLs by mouth every 4 (four) hours as needed for cough or to loosen phlegm. 2 teaspoonfulq 6 hours , prn    [provider]  ibuprofen (ADVIL) 600 MG  tablet Take 600 mg by mouth every 6 (six) hours as needed.    [provider]  loperamide (IMODIUM A-D) 2 MG tablet Take 2 mg by mouth every 6 (six) hours as needed for diarrhea or loose stools. Take one capsule every 6 hours    [provider]  magnesium hydroxide (MILK OF MAGNESIA) 400 MG/5ML suspension Take 5 mLs by mouth daily. Take 1 tablespoon at bedtime,prn    [provider]  Multiple Vitamin (MULTIVITAMIN WITH MINERALS) TABS Take 1 tablet by mouth daily.    [provider]  OLANZapine (ZYPREXA) 10 MG tablet Take 10 mg by mouth at bedtime. 03/20/20   [provider]  propranolol ER (INDERAL LA) 60 MG 24 hr capsule Take 1 capsule (60 mg total) by mouth daily. 12/14/19   Drema Dallas, MD  traZODone (DESYREL) 50 MG tablet Take 50 mg by mouth at bedtime.    [provider]  triamcinolone cream (KENALOG) 0.1 % Apply topically 2 (two) times daily as needed. 03/01/22   [provider]  UNABLE TO FIND Take 30 mLs by mouth every 6 (six) hours as needed. Antacid Anti-gas liquid    [provider]  Vitamin D, Ergocalciferol, (DRISDOL) 1.25 MG (50000 UNIT) CAPS capsule Take 50,000 Units by mouth once a week. 03/01/22   [provider]      Allergies    Patient has no  known allergies.    Review of Systems   See HPI for pertinent positives  Physical Exam Updated Vital Signs BP 114/68   Pulse 98   Temp 98.2 F (36.8 C) (Oral)   Resp 16   Ht 6\' 3"  (1.905 m)   Wt 93 kg   SpO2 97%   BMI 25.62 kg/m  Physical Exam Vitals and nursing note reviewed.  HENT:     Head: Normocephalic and atraumatic.     Mouth/Throat:     Mouth: Mucous membranes are moist.  Eyes:     General:        Right eye: No discharge.        Left eye: No discharge.     Conjunctiva/sclera: Conjunctivae normal.  Cardiovascular:     Rate and Rhythm: Regular rhythm. Tachycardia present.     Pulses: Normal pulses.     Heart sounds: Normal heart  sounds.  Pulmonary:     Effort: Pulmonary effort is normal.     Breath sounds: Normal breath sounds.  Abdominal:     General: Abdomen is flat.     Palpations: Abdomen is soft.  Skin:    General: Skin is warm and dry.  Neurological:     General: No focal deficit present.  Psychiatric:        Mood and Affect: Mood normal.     ED Results / Procedures / Treatments   Labs (all labs ordered are listed, but only abnormal results are displayed) Labs Reviewed  CBC - Abnormal; Notable for the following components:      Result Value   Hemoglobin 12.5 (*)    HCT 38.6 (*)    All other components within normal limits  BASIC METABOLIC PANEL - Abnormal; Notable for the following components:   Sodium 134 (*)    CO2 19 (*)    Creatinine, Ser 1.36 (*)    All other components within normal limits  URINALYSIS, ROUTINE W REFLEX MICROSCOPIC - Abnormal; Notable for the following components:   APPearance HAZY (*)    Ketones, ur 5 (*)    Protein, ur 30 (*)    All other components within normal limits  LACTIC ACID, PLASMA - Abnormal; Notable for the following components:   Lactic Acid, Venous 2.2 (*)    All other components within normal limits  ACETAMINOPHEN LEVEL - Abnormal; Notable for the following components:   Acetaminophen (Tylenol), Serum <10 (*)    All other components within normal limits  SALICYLATE LEVEL - Abnormal; Notable for the following components:   Salicylate Lvl <7.0 (*)    All other components within normal limits  LACTIC ACID, PLASMA  ETHANOL  CBG MONITORING, ED    EKG EKG Interpretation  Date/Time:  Monday July 25 2022 13:13:41 EDT Ventricular Rate:  110 PR Interval:  142 QRS Duration: 83 QT Interval:  342 QTC Calculation: 463 R Axis:   84 Text Interpretation: Sinus tachycardia Borderline ST depression, diffuse leads Confirmed by Gloris Manchester [phone]) on 07/25/2022 3:25:17 PM  Radiology DG Chest Portable 1 View  Result Date: 07/25/2022 CLINICAL DATA:  Altered  mental status EXAM: PORTABLE CHEST 1 VIEW COMPARISON:  X-ray 12/06/2019 FINDINGS: No consolidation, pneumothorax or effusion. No edema. Normal cardiopericardial silhouette. Overlapping cardiac leads. IMPRESSION: No acute cardiopulmonary disease. Electronically Signed   By: Karen Kays M.D.   On: 07/25/2022 13:18    Procedures Procedures    Medications Ordered in ED Medications  sodium chloride 0.9 % bolus 1,000 mL (0 mLs  Intravenous Stopped 07/25/22 1421)  LORazepam (ATIVAN) injection 1 mg (1 mg Intravenous Not Given 07/25/22 1305)  sodium chloride 0.9 % bolus 500 mL (0 mLs Intravenous Stopped 07/25/22 1619)    ED Course/ Medical Decision Making/ A&P Clinical Course as of 07/25/22 1659  Mon Jul 25, 2022  1658 Spoke to Hamlet who is a Merchandiser, retail at his place of residence at [employer] in [place], [place]. Her number is [phone].  She states that today during his group session he was notably agitated.  She reports that he was concerned that there were things "crawling all over him".  She denies any other medical complaints for him.  She also mention that he has been hallucinating more frequently, primarily visual hallucinations. [JR]    Clinical Course User Index [JR] Gareth Eagle, PA-C                             Medical Decision Making Amount and/or Complexity of Data Reviewed Labs: ordered. Radiology: ordered.  Risk Prescription drug management.   Initial Impression and Ddx 47 year old well-appearing male presenting for altered mental status.  Exam notable for tachycardia.  DDx includes stroke, sepsis, electrolyte derangement, PE, ACS, arrhythmia, psychogenic, dehydration, AKI Patient PMH that increases complexity of ED encounter: schizophrenia and bipolar 1 disorder  Interpretation of Diagnostics - I independent reviewed and interpreted the labs as followed: Mild lactic acidosis  - I independently visualized the following imaging with scope of interpretation  limited to determining acute life threatening conditions related to emergency care: CXR, which revealed no acute findings  -I personally reviewed and interpreted EKG which revealed sinus tachycardia  Patient Reassessment and Ultimate Disposition/Management Overall appears clinically well. Had low suspicion for sepsis but was initially tachycardic with lactic acidosis. Suspected likely dehydration.  Lactic acid did improve with volume resuscitation. PE also unlikely given no chest pain or shortness of breath.  Symptoms not consistent with ACS.  Suspect symptoms could be related to decompensated schizophrenia.  There was some concern at the center that he did not take his meds this morning with hallucinations yesterday evening.  Medically cleared and placed patient in psych hold for TTS evaluation.  Patient management required discussion with the following services or consulting groups:  None  Complexity of Problems Addressed Acute complicated illness or Injury  Additional Data Reviewed and Analyzed Further history obtained from: Past medical history and medications listed in the EMR and Prior ED visit notes  Patient Encounter Risk Assessment Consideration of hospitalization    Final Clinical Impression(s) / ED Diagnoses Final diagnoses:  Altered mental status, unspecified altered mental status type    Rx / DC Orders ED Discharge Orders     None        Gareth Eagle, PA-C 07/25/22 1700    Gloris Manchester, MD 07/26/22 [phone]

## 2022-07-25 NOTE — ED Notes (Signed)
Pt is now following all commands Pt stated "sorry for the way I acted earlier, I was just anxious"  Pt requested food. Gave crackers and water  Waiting on test results.  Currently 700cc fluids completed

## 2022-07-25 NOTE — Consult Note (Signed)
[facility] Consult Note  Patient Name: Brian Stafford MRN: [id number] DOB: Apr 26, 1975 DATE OF Consult: 07/25/2022  PRIMARY PSYCHIATRIC DIAGNOSES  1.  Schizoaffective Disorder 2.  Intellectual & Developmentally Disabled 3. Anxiety  RECOMMENDATIONS  Recommendations: Medication recommendations: May resume home psychotropic medications as prescribed. Non-Medication/therapeutic recommendations: Does not meet criteria for involuntary hold. At this time the patient is not considered to be a danger to themselves or others and does not require inpatient psychiatric admission. May safely follow up with outpatient mental health resources. Return to the ED should symptoms worsen in any way. At current time patient implicitly/explicitly denied active suicidal/homicidal ideation intent/plan, distressing acute hallucinations. Recommendation to follow up timely with outpatient primary care clinic and continue current treatment plan as documented and to follow up with any pertinent labs. Recommendation that if at any point concerning decompensation/return of distressing symptoms to call 911/return to hospital for further evaluation. Recommendation of medication adherence and abstinence of substance use out of abundance of caution for prevention of medical sequela but also for behavioral-psychiatric implications that could place patient/others at risk of harm including premature death. Additionally provide; National Suicide Prevention Life Line at [employer] 878-359-3848). Is inpatient psychiatric hospitalization recommended for this patient? No. From a psychiatric perspective, is this patient appropriate for discharge to an outpatient setting/resource or other less restrictive environment for continued care?  Yes. Communication: Treatment team members (and family members if applicable) who were involved in treatment/care discussions and planning, and with whom we spoke or engaged with via secure text/chat,  include the following: patient, ED MD Dr. Luciana Axe.  Thank you for involving [place] in the care of this patient. If you have any additional questions or concerns, please call [phone] and ask for me or the provider on-call.  TELEPSYCHIATRY ATTESTATION & CONSENT  As the provider for this telehealth consult, I attest that I verified the patient's identity using two separate identifiers, introduced myself to the patient, provided my credentials, disclosed my location, and performed this encounter via a HIPAA-compliant, real-time, face-to-face, two-way, interactive audio and video platform and with the full consent and agreement of the patient (or guardian as applicable.)  Patient physical location: [facility]. Telehealth provider physical location: home office in state of [place].  Video start time: 11:07 PM Video end time: 11:34p  IDENTIFYING DATA  Brian Stafford is a 47 y.o. year-old male for whom a psychiatric consultation has been ordered by the primary provider. The patient was identified using two separate identifiers.  CHIEF COMPLAINT/REASON FOR CONSULT  Anxiety, pacing  HISTORY OF PRESENT ILLNESS (HPI)  Brian Stafford is a 47y/o male with a psychiatric history significant for Schizoaffective Disorder and IDD who presents to the ED with complaint of pacing, restlessness, itching in the context of no clearly identifiable psychosocial stressors. The patient reports coming to the ED due to "." He reports coming to the ED due to "I was in one of my moods at the program." He says he was scratching himself because he felt an itching sensation and pacing around but isn't sure why. He says he was compelled to do so and this sort of thing has occurred in the past. He says he has discussed this problem with his psychiatrist and thinks it was discussed that it could have been due to a medication. He said the issue is no longer bothering him and he feels fine. He reports history of Schizophrenia. He does have  previous inpatient psychiatric hospitalizations, most recently eight months ago at [facility]  in [place], [place]. He found that to be helpful but doesn't feel like he needs that type of care at this time. He denies any previous suicide attempts. He does see an outpatient psychiatrist who prescribes unspecified psychotropic medications. He doesn't know the names or indications. He denies any medication allergies. He says he is staying in a local group home; no access to firearms. He says he has a Sports coach named Nikki. He does not know how to get in touch with her, unfortunately. He denies any SI/HI currently but does experience AVH in the form of "whispering in my ear and it makes me happy." He reports good sleep and appetite, although he did miss dinner last night at the group home. He has several questions about his group home at this point, seems to feel comfortable returning there tonight. He has no other concerns or complaints at this time.  PAST PSYCHIATRIC HISTORY  Otherwise as per HPI above.  PAST MEDICAL HISTORY  Past Medical History:  Diagnosis Date   Anxiety    Bipolar 1 disorder (HCC)    Mental retardation    Schizophrenia (HCC)      HOME MEDICATIONS  Facility Ordered Medications  Medication   [COMPLETED] sodium chloride 0.9 % bolus 1,000 mL   [COMPLETED] LORazepam (ATIVAN) injection 1 mg   [COMPLETED] sodium chloride 0.9 % bolus 500 mL   PTA Medications  Medication Sig   Multiple Vitamin (MULTIVITAMIN WITH MINERALS) TABS Take 1 tablet by mouth daily.   benztropine (COGENTIN) 2 MG tablet Take 1 tablet (2 mg total) by mouth daily.   FLUoxetine (PROZAC) 40 MG capsule Take 1 capsule (40 mg total) by mouth daily.   propranolol ER (INDERAL LA) 60 MG 24 hr capsule Take 1 capsule (60 mg total) by mouth daily.   divalproex (DEPAKOTE ER) 500 MG 24 hr tablet Take 1,000 mg by mouth at bedtime.   cloZAPine (CLOZARIL) 100 MG tablet Take 100-300 mg by mouth in the morning and at bedtime.  300mg  qam and 100mg  qpm   OLANZapine (ZYPREXA) 10 MG tablet Take 10 mg by mouth at bedtime.   Vitamin D, Ergocalciferol, (DRISDOL) 1.25 MG (50000 UNIT) CAPS capsule Take 50,000 Units by mouth once a week.   triamcinolone cream (KENALOG) 0.1 % Apply topically 2 (two) times daily as needed.   benztropine (COGENTIN) 1 MG tablet Take 1 mg by mouth daily.   traZODone (DESYREL) 50 MG tablet Take 50 mg by mouth at bedtime.   guaiFENesin (ROBITUSSIN) 100 MG/5ML liquid Take 5 mLs by mouth every 4 (four) hours as needed for cough or to loosen phlegm. 2 teaspoonfulq 6 hours , prn   ibuprofen (ADVIL) 600 MG tablet Take 600 mg by mouth every 6 (six) hours as needed.   loperamide (IMODIUM A-D) 2 MG tablet Take 2 mg by mouth every 6 (six) hours as needed for diarrhea or loose stools. Take one capsule every 6 hours   acetaminophen (TYLENOL) 325 MG tablet Take 650 mg by mouth every 4 (four) hours as needed.   UNABLE TO FIND Take 30 mLs by mouth every 6 (six) hours as needed. Antacid Anti-gas liquid   magnesium hydroxide (MILK OF MAGNESIA) 400 MG/5ML suspension Take 5 mLs by mouth daily. Take 1 tablespoon at bedtime,prn    ALLERGIES  No Known Allergies  SOCIAL & SUBSTANCE USE HISTORY  Social History   Socioeconomic History   Marital status: Single    Spouse name: Not on file   Number of children:  Not on file   Years of education: Not on file   Highest education level: Not on file  Occupational History   Not on file  Tobacco Use   Smoking status: Unknown   Smokeless tobacco: Never   Tobacco comments:    UTA  Vaping Use   Vaping Use: Unknown  Substance and Sexual Activity   Alcohol use: Not Currently    Comment: UTA   Drug use: Not Currently    Comment: UTA   Sexual activity: Not on file    Comment: UTA  Other Topics Concern   Not on file  Social History Narrative   Not on file   Social Determinants of Health   Financial Resource Strain: Not on file  Food Insecurity: Not on file   Transportation Needs: Not on file  Physical Activity: Not on file  Stress: Not on file  Social Connections: Not on file   Social History   Tobacco Use  Smoking Status Unknown  Smokeless Tobacco Never  Tobacco Comments   UTA   Social History   Substance and Sexual Activity  Alcohol Use Not Currently   Comment: UTA   Social History   Substance and Sexual Activity  Drug Use Not Currently   Comment: UTA    FAMILY HISTORY  Family History  Family history unknown: Yes    MENTAL STATUS EXAM (MSE)  Presentation  General Appearance:  Fairly groomed, hospital attire  Eye Contact: Fair  Speech: Clear and Coherent  Speech Volume: Normal  Handedness: Right   Mood and Affect  Mood: Euthymic  Affect: Euthymic  Thought Process  Thought Processes: Coherent  Descriptions of Associations: Intact  Orientation: Full (Time, Place and Person)  Thought Content: AVH in the context of "whispers in my ear that make me happy"  History of Schizophrenia/Schizoaffective disorder: Yes  Duration of Psychotic Symptoms: Greater than six months  Hallucinations:No data recorded Ideas of Reference: denies  Suicidal Thoughts:No data recorded Homicidal Thoughts:No data recorded  Sensorium  Memory: Immediate Fair; Recent Fair; Remote Fair  Judgment: Fair  Insight: Fair   Art therapist  Concentration: Fair  Attention Span: Fair  Recall: [employer] of Knowledge: Fair  Language: Fair   Psychomotor Activity  Psychomotor Activity:No data recorded  Assets  Assets: Communication Skills; Desire for Improvement; Intimacy; Leisure Time; Physical Health; Resilience; Social Support   Sleep  Sleep:No data recorded  VITALS  Blood pressure 104/87, pulse (!) 109, temperature 98 F (36.7 C), temperature source Oral, resp. rate 12, height 6\' 3"  (1.905 m), weight 93 kg, SpO2 98 %.  LABS  Admission on 07/25/2022  Component Date Value Ref Range  Status   Glucose-Capillary 07/25/2022 96  70 - 99 mg/dL Final   Glucose reference range applies only to samples taken after fasting for at least 8 hours.   WBC 07/25/2022 5.0  4.0 - 10.5 K/uL Final   RBC 07/25/2022 4.63  4.22 - 5.81 MIL/uL Final   Hemoglobin 07/25/2022 12.5 (L)  13.0 - 17.0 g/dL Final   HCT 84/13/2440 38.6 (L)  39.0 - 52.0 % Final   MCV 07/25/2022 83.4  80.0 - 100.0 fL Final   MCH 07/25/2022 27.0  26.0 - 34.0 pg Final   MCHC 07/25/2022 32.4  30.0 - 36.0 g/dL Final   RDW 11/27/2534 13.8  11.5 - 15.5 % Final   Platelets 07/25/2022 232  150 - 400 K/uL Final   nRBC 07/25/2022 0.0  0.0 - 0.2 % Final   Performed  at [facility], [address], [place], [place] [address]   Sodium 07/25/2022 134 (L)  135 - 145 mmol/L Final   Potassium 07/25/2022 3.5  3.5 - 5.1 mmol/L Final   Chloride 07/25/2022 104  98 - 111 mmol/L Final   CO2 07/25/2022 19 (L)  22 - 32 mmol/L Final   Glucose, Bld 07/25/2022 96  70 - 99 mg/dL Final   Glucose reference range applies only to samples taken after fasting for at least 8 hours.   BUN 07/25/2022 18  6 - 20 mg/dL Final   Creatinine, Ser 07/25/2022 1.36 (H)  0.61 - 1.24 mg/dL Final   Calcium 73/22/0254 9.0  8.9 - 10.3 mg/dL Final   GFR, Estimated 07/25/2022 >60  >60 mL/min Final   Comment: (NOTE) Calculated using the CKD-EPI Creatinine Equation (2021)    Anion gap 07/25/2022 11  5 - 15 Final   Performed at [facility], [address], [place], [place] [address]   Color, Urine 07/25/2022 YELLOW  YELLOW Final   APPearance 07/25/2022 HAZY (A)  CLEAR Final   Specific Gravity, Urine 07/25/2022 1.026  1.005 - 1.030 Final   pH 07/25/2022 5.0  5.0 - 8.0 Final   Glucose, UA 07/25/2022 NEGATIVE  NEGATIVE mg/dL Final   Hgb urine dipstick 07/25/2022 NEGATIVE  NEGATIVE Final   Bilirubin Urine 07/25/2022 NEGATIVE  NEGATIVE Final   Ketones, ur 07/25/2022 5 (A)  NEGATIVE mg/dL Final   Protein, ur 37/62/8315 30 (A)  NEGATIVE mg/dL Final   Nitrite 17/61/6073  NEGATIVE  NEGATIVE Final   Leukocytes,Ua 07/25/2022 NEGATIVE  NEGATIVE Final   RBC / HPF 07/25/2022 6-10  0 - 5 RBC/hpf Final   WBC, UA 07/25/2022 0-5  0 - 5 WBC/hpf Final   Bacteria, UA 07/25/2022 NONE SEEN  NONE SEEN Final   Squamous Epithelial / HPF 07/25/2022 0-5  0 - 5 /HPF Final   Mucus 07/25/2022 PRESENT   Final   Performed at [facility], [address], [place], [place] [address]   Lactic Acid, Venous 07/25/2022 1.5  0.5 - 1.9 mmol/L Final   Performed at [facility], [address] [place], [place] [address]   Lactic Acid, Venous 07/25/2022 2.2 (HH)  0.5 - 1.9 mmol/L Final   Comment: CRITICAL RESULT CALLED TO, READ BACK BY AND VERIFIED WITH TINA TALBOTT @ 1358 ON 07/25/22 Riesa Pope Performed at [facility], [address], [place], [place] [address]    Acetaminophen (Tylenol), Serum 07/25/2022 <10 (L)  10 - 30 ug/mL Final   Comment: (NOTE) Therapeutic concentrations vary significantly. A range of 10-30 ug/mL  may be an effective concentration for many patients. However, some  are best treated at concentrations outside of this range. Acetaminophen concentrations >150 ug/mL at 4 hours after ingestion  and >50 ug/mL at 12 hours after ingestion are often associated with  toxic reactions.  Performed at [facility], [address] [place], [place] [address]    Alcohol, Ethyl (B) 07/25/2022 <10  <10 mg/dL Final   Comment: (NOTE) Lowest detectable limit for serum alcohol is 10 mg/dL.  For medical purposes only. Performed at [facility], [address] [place], [place] [address]    Salicylate Lvl 07/25/2022 <7.0 (L)  7.0 - 30.0 mg/dL Final   Performed at [facility], [address], [place], [place] [address]    PSYCHIATRIC REVIEW OF SYSTEMS (ROS)  Additional findings:      Musculoskeletal: No abnormal movements observed      Gait & Station:  Normal      RISK FORMULATION/ASSESSMENT  Is the patient experiencing any suicidal or homicidal ideations: No      Protective factors  considered for safety management: domiciled, no access to firearms, no previous suicide attempts, not currently suicidal  Risk factors/concerns considered for safety management: Male gender Unmarried  Is there a Astronomer plan with the patient and treatment team to minimize risk factors and promote protective factors: Yes          Is crisis care placement or psychiatric hospitalization recommended: No     Based on my current evaluation and risk assessment, patient is determined at this time to be at:  Low risk  *RISK ASSESSMENT Risk assessment is a dynamic process; it is possible that this patient's condition, and risk level, may change. This should be re-evaluated and managed over time as appropriate. Please re-consult psychiatric consult services if additional assistance is needed in terms of risk assessment and management. If your team decides to discharge this patient, please advise the patient how to best access emergency psychiatric services, or to call 911, if their condition worsens or they feel unsafe in any way.   Kingsley Callander, DO Telepsychiatry Consult Services

## 2022-07-26 NOTE — ED Notes (Addendum)
Contact was made to [facility] with Meyer Cory. Alcario Drought was informed of pt's disposition and was given a contact number and name: Barbie Banner [phone] Onalee Hua was informed of pt's discharge as he is the facilities administrator and informed this RN that their facility will not have a driver to transport pt until 7am and will be around 8am when the driver can pick up pt.

## 2022-07-26 NOTE — Progress Notes (Signed)
Pt has been psych cleared per Kingsley Callander, DO [facility]. This CSW will now remove from [facility] shift report. Nursing staff and EDP aware. TOC to assist if identified need for discharge.  Maryjean Ka, MSW, [facility] 07/26/2022 1:59 AM

## 2022-08-14 ENCOUNTER — Emergency Department: Payer: Medicare HMO

## 2022-08-14 ENCOUNTER — Emergency Department
Admission: EM | Admit: 2022-08-14 | Discharge: 2022-08-22 | Disposition: A | Discharge status: Home / Self Care | Payer: Medicare HMO | Attending: Emergency Medicine | Admitting: Emergency Medicine

## 2022-08-14 ENCOUNTER — Other Ambulatory Visit: Payer: Self-pay

## 2022-08-14 ENCOUNTER — Encounter: Payer: Self-pay | Admitting: Emergency Medicine

## 2022-08-14 DIAGNOSIS — F1610 Hallucinogen abuse, uncomplicated: Secondary | ICD-10-CM | POA: Diagnosis not present

## 2022-08-14 DIAGNOSIS — F209 Schizophrenia, unspecified: Secondary | ICD-10-CM

## 2022-08-14 DIAGNOSIS — M25531 Pain in right wrist: Secondary | ICD-10-CM | POA: Insufficient documentation

## 2022-08-14 DIAGNOSIS — S52602A Unspecified fracture of lower end of left ulna, initial encounter for closed fracture: Secondary | ICD-10-CM

## 2022-08-14 DIAGNOSIS — R4689 Other symptoms and signs involving appearance and behavior: Secondary | ICD-10-CM | POA: Diagnosis not present

## 2022-08-14 DIAGNOSIS — F203 Undifferentiated schizophrenia: Secondary | ICD-10-CM | POA: Diagnosis present

## 2022-08-14 LAB — URINE DRUG SCREEN, QUALITATIVE (ARMC ONLY)
Amphetamines, Ur Screen: NOT DETECTED
Barbiturates, Ur Screen: NOT DETECTED
Benzodiazepine, Ur Scrn: NOT DETECTED
Cannabinoid 50 Ng, Ur ~~LOC~~: NOT DETECTED
Cocaine Metabolite,Ur ~~LOC~~: NOT DETECTED
MDMA (Ecstasy)Ur Screen: POSITIVE — AB
Methadone Scn, Ur: NOT DETECTED
Opiate, Ur Screen: NOT DETECTED
Phencyclidine (PCP) Ur S: NOT DETECTED
Tricyclic, Ur Screen: POSITIVE — AB

## 2022-08-14 LAB — CBC
HCT: 38.1 % — ABNORMAL LOW (ref 39.0–52.0)
Hemoglobin: 12.4 g/dL — ABNORMAL LOW (ref 13.0–17.0)
MCH: 27.3 pg (ref 26.0–34.0)
MCHC: 32.5 g/dL (ref 30.0–36.0)
MCV: 83.7 fL (ref 80.0–100.0)
Platelets: 282 10*3/uL (ref 150–400)
RBC: 4.55 MIL/uL (ref 4.22–5.81)
RDW: 13.4 % (ref 11.5–15.5)
WBC: 7.3 10*3/uL (ref 4.0–10.5)
nRBC: 0 % (ref 0.0–0.2)

## 2022-08-14 LAB — ACETAMINOPHEN LEVEL: Acetaminophen (Tylenol), Serum: <10 ug/mL — ABNORMAL LOW (ref 10–30)

## 2022-08-14 LAB — COMPREHENSIVE METABOLIC PANEL
ALT: 21 U/L (ref 0–44)
AST: 26 U/L (ref 15–41)
Albumin: 4.2 g/dL (ref 3.5–5.0)
Alkaline Phosphatase: 57 U/L (ref 38–126)
Anion gap: 10 (ref 5–15)
BUN: 12 mg/dL (ref 6–20)
CO2: 23 mmol/L (ref 22–32)
Calcium: 9.4 mg/dL (ref 8.9–10.3)
Chloride: 108 mmol/L (ref 98–111)
Creatinine, Ser: 1.39 mg/dL — ABNORMAL HIGH (ref 0.61–1.24)
GFR, Estimated: >60 mL/min (ref >60)
Glucose, Bld: 107 mg/dL — ABNORMAL HIGH (ref 70–99)
Potassium: 3.7 mmol/L (ref 3.5–5.1)
Sodium: 141 mmol/L (ref 135–145)
Total Bilirubin: 0.6 mg/dL (ref 0.3–1.2)
Total Protein: 8 g/dL (ref 6.5–8.1)

## 2022-08-14 LAB — SALICYLATE LEVEL: Salicylate Lvl: <7 mg/dL — ABNORMAL LOW (ref 7.0–30.0)

## 2022-08-14 LAB — ETHANOL: Alcohol, Ethyl (B): <10 mg/dL (ref <10)

## 2022-08-14 MED ORDER — TRAZODONE HCL 50 MG PO TABS
50.0000 mg | ORAL_TABLET | Freq: Every day | ORAL | Status: DC
  Administered 2022-08-14 – 2022-08-21 (×8): 50 mg via ORAL
  Filled 2022-08-14 (×8): qty 1

## 2022-08-14 MED ORDER — CLOZAPINE 100 MG PO TABS
200.0000 mg | ORAL_TABLET | Freq: Two times a day (BID) | ORAL | Status: DC
  Administered 2022-08-14 – 2022-08-22 (×17): 200 mg via ORAL
  Filled 2022-08-14 (×17): qty 2

## 2022-08-14 MED ORDER — PROPRANOLOL HCL ER 60 MG PO CP24
60.0000 mg | ORAL_CAPSULE | Freq: Every day | ORAL | Status: DC
  Administered 2022-08-14 – 2022-08-22 (×8): 60 mg via ORAL
  Filled 2022-08-14 (×9): qty 1

## 2022-08-14 MED ORDER — BENZTROPINE MESYLATE 1 MG PO TABS
1.0000 mg | ORAL_TABLET | Freq: Every day | ORAL | Status: DC
  Administered 2022-08-14 – 2022-08-22 (×9): 1 mg via ORAL
  Filled 2022-08-14 (×9): qty 1

## 2022-08-14 MED ORDER — OLANZAPINE 10 MG PO TABS
10.0000 mg | ORAL_TABLET | Freq: Every day | ORAL | Status: DC
  Administered 2022-08-14 – 2022-08-21 (×8): 10 mg via ORAL
  Filled 2022-08-14 (×8): qty 1

## 2022-08-14 MED ORDER — FENOFIBRATE 54 MG PO TABS
54.0000 mg | ORAL_TABLET | Freq: Every day | ORAL | Status: DC
  Administered 2022-08-14 – 2022-08-22 (×9): 54 mg via ORAL
  Filled 2022-08-14 (×9): qty 1

## 2022-08-14 MED ORDER — DIVALPROEX SODIUM 500 MG PO DR TAB
500.0000 mg | DELAYED_RELEASE_TABLET | Freq: Two times a day (BID) | ORAL | Status: DC
  Administered 2022-08-14 – 2022-08-22 (×17): 500 mg via ORAL
  Filled 2022-08-14 (×17): qty 1

## 2022-08-14 MED ORDER — ADULT MULTIVITAMIN W/MINERALS CH
1.0000 | ORAL_TABLET | Freq: Every day | ORAL | Status: DC
  Administered 2022-08-14 – 2022-08-22 (×9): 1 via ORAL
  Filled 2022-08-14 (×9): qty 1

## 2022-08-14 NOTE — ED Notes (Addendum)
Called dietary again; food tray successfully ordered.
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
## 2022-08-14 NOTE — ED Notes (Signed)
Pt in restroom; will provide scheduled meds once received from pharm and pt back to his room.

## 2022-08-14 NOTE — ED Notes (Signed)
Pt received breakfast tray and drink. 

## 2022-08-14 NOTE — ED Notes (Signed)
Pt c/o LEFT wrist pain; see triage note; pt able to move limb appropriately but not without inc pain.
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
## 2022-08-14 NOTE — ED Notes (Signed)
Pt provided cereal in a foam cup and a carton of milk. Pt made aware again no other food would be provided until morning. Pt expressed no further needs at this time.

## 2022-08-14 NOTE — ED Notes (Signed)
Imaging staff to bedside.  

## 2022-08-14 NOTE — ED Notes (Signed)
Kim NT to bedside now.

## 2022-08-14 NOTE — ED Notes (Signed)
IVC/pending psych consult 

## 2022-08-14 NOTE — ED Notes (Signed)
Security and NT escorting pt in wheelchair to [facility]. Erskine Squibb RN aware and received report. 1 belongings bag sent with NT.

## 2022-08-14 NOTE — ED Notes (Signed)
Attempted to contact dietary team as breakfast tray has not yet arrived; no answer.

## 2022-08-14 NOTE — Progress Notes (Signed)
Pharmacy Consult - Clozapine     47 yo male ordered clozapine 200 mg PO twice daily  This patient's order has been reviewed for prescribing contraindications.    Clozapine REMS enrollment Verified: yes on  DATE 08/14/2022 REMS patient ID: [id number] Current Outpatient Monitoring: Monthly    Home Regimen:  400 mg PO every evening (dispensed 07/19/2022) Last dose: 08/13/2022   Dose Adjustments This Admission: Clozapine 200mg  BID   Labs: Date    ANC    Submitted?      Plan: Start clozapine 200mg  PO BID today (as ordered) CBD done today without differential. Will order new labs for tomorrow AM, then monitor ANC at least weekly while inpatient   Adriauna Campton Rodriguez-Guzman PharmD, BCPS 08/14/2022 1:09 PM
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
## 2022-08-14 NOTE — ED Notes (Signed)
Kim NT to stop by soon to place sugartong splint as ordered by EDP Roxan Hockey.

## 2022-08-14 NOTE — ED Notes (Signed)
EDP Brian Stafford notified in person of imaging results.
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
## 2022-08-14 NOTE — ED Notes (Signed)
Pt dressed out by this tech and EDT Melissa and Emergency planning/management officer. Pt belongings include: black shoes Black socks Gray pants [employer] Gray hoodie Grey underwear.

## 2022-08-14 NOTE — ED Notes (Signed)
TTS and Psych NP speaking with patient. 

## 2022-08-14 NOTE — ED Notes (Signed)
Snack provided and pt encouraged to sit up to eat. Pt provided with a warm blanket and tv changed to channel of choice. Pt made aware no other food would be provided until breakfast in the morning. Pt also made aware only fresh water would be provided until breakfast time. Pt denied any further needs at this time.
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
## 2022-08-15 DIAGNOSIS — F209 Schizophrenia, unspecified: Secondary | ICD-10-CM | POA: Diagnosis not present

## 2022-08-15 LAB — CBC WITH DIFFERENTIAL/PLATELET
Abs Immature Granulocytes: 0.02 10*3/uL (ref 0.00–0.07)
Basophils Absolute: 0 10*3/uL (ref 0.0–0.1)
Basophils Relative: 1 %
Eosinophils Absolute: 0.1 10*3/uL (ref 0.0–0.5)
Eosinophils Relative: 2 %
HCT: 37.3 % — ABNORMAL LOW (ref 39.0–52.0)
Hemoglobin: 12.4 g/dL — ABNORMAL LOW (ref 13.0–17.0)
Immature Granulocytes: 0 %
Lymphocytes Relative: 31 %
Lymphs Abs: 1.8 10*3/uL (ref 0.7–4.0)
MCH: 27.4 pg (ref 26.0–34.0)
MCHC: 33.2 g/dL (ref 30.0–36.0)
MCV: 82.5 fL (ref 80.0–100.0)
Monocytes Absolute: 0.5 10*3/uL (ref 0.1–1.0)
Monocytes Relative: 8 %
Neutro Abs: 3.5 10*3/uL (ref 1.7–7.7)
Neutrophils Relative %: 58 %
Platelets: 263 10*3/uL (ref 150–400)
RBC: 4.52 MIL/uL (ref 4.22–5.81)
RDW: 13.3 % (ref 11.5–15.5)
WBC: 6 10*3/uL (ref 4.0–10.5)
nRBC: 0 % (ref 0.0–0.2)

## 2022-08-15 MED ORDER — ACETAMINOPHEN 500 MG PO TABS
1000.0000 mg | ORAL_TABLET | Freq: Once | ORAL | Status: AC
  Administered 2022-08-15: 1000 mg via ORAL
  Filled 2022-08-15: qty 2

## 2022-08-15 NOTE — Progress Notes (Addendum)
Pharmacy Consult - Clozapine     47 yo male ordered clozapine 200 mg PO twice daily  This patient's order has been reviewed for prescribing contraindications.    Clozapine REMS enrollment Verified: yes on  DATE 08/14/2022 REMS patient ID: [id number] Current Outpatient Monitoring: Monthly    Home Regimen:  Clozapine 400 mg PO every evening (dispensed 07/19/2022)    Dose Adjustments This Admission: Clozapine 200mg  PO BID   Labs: Date    ANC    Submitted? 7/15     3500       Yes     Plan: Continue clozapine 200mg  PO BID (as ordered) Monitor ANC at least weekly while inpatient. Next CBC w/ differential on 07/22.   Buddy Duty, PharmD Student 08/15/2022 11:35 AM

## 2022-08-15 NOTE — ED Notes (Signed)
IVC TTS HAS REFERRALS OUT FOR PLACEMENT

## 2022-08-15 NOTE — ED Notes (Signed)
This tech obtained vital signs on pt.  
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
## 2022-08-15 NOTE — ED Provider Notes (Signed)
Emergency Medicine Observation Re-evaluation Note  Brian Stafford is a 47 y.o. male, seen on rounds today.  Pt initially presented to the ED for complaints of IVC  Currently, the patient is resting in bed. No reported issues from nursing team.   Physical Exam  BP (!) 107/58 (BP Location: Right Arm)   Pulse 89   Temp 97.9 F (36.6 C) (Oral)   Resp 19   Ht 6\' 3"  (1.905 m)   Wt 93 kg   SpO2 93%   BMI 25.62 kg/m  Physical Exam General: Resting comfortably in bed  ED Course / MDM  EKG:   No labs last 24 hours  Plan  Current plan is for inpatient psych admission.    Trinna Post, MD 08/15/22 1640

## 2022-08-15 NOTE — ED Notes (Signed)
Pt given phone to speak with his POA.

## 2022-08-15 NOTE — ED Notes (Signed)
This pt given snack and beverage.

## 2022-08-15 NOTE — BH Assessment (Signed)
Adult MH  Referral information for Psychiatric Hospitalization faxed to:   [facility] ([phone]-or- [phone]),   [facility] ([phone]),   [facility] ([phone] -or- [phone]),   [facility] ([phone]---[phone]---[phone]),   [facility] ([phone] or [phone])   [facility] ([phone] or [phone]),   [facility] ([phone]) 

## 2022-08-16 DIAGNOSIS — F209 Schizophrenia, unspecified: Secondary | ICD-10-CM | POA: Diagnosis not present

## 2022-08-16 NOTE — ED Notes (Signed)
ivc/recommend for psychiatric admission by an alternate provider per NP Bennett.

## 2022-08-16 NOTE — ED Notes (Signed)
 Report received from Ally, RN.

## 2022-08-16 NOTE — ED Notes (Signed)
Pt given breakfast tray and beverage.  

## 2022-08-16 NOTE — ED Provider Notes (Signed)
Emergency Medicine Observation Re-evaluation Note  Physical Exam   BP (!) 112/48 (BP Location: Left Arm)   Pulse 99   Temp 97.9 F (36.6 C) (Oral)   Resp 18   Ht 6\' 3"  (1.905 m)   Wt 93 kg   SpO2 98%   BMI 25.62 kg/m   Patient appears in no acute distress.  ED Course / MDM   No reported events during my shift at the time of this note.   Pt is awaiting dispo from SW   Pilar Jarvis MD    Pilar Jarvis, MD 08/16/22 [phone]

## 2022-08-16 NOTE — ED Notes (Signed)
Pt's POA came to visit. She gave staff Purvis Kilts number [phone]. Mr. Wanda Plump is the group home director of home where pt came from; may be willing to accept him back if medications are changed and behavior improves.

## 2022-08-16 NOTE — BH Assessment (Signed)
Writer spoke to patient's Brian Stafford [phone] [phone]. She states patient has no known intellectual delays and has been diagnosed with schizophrenia. She reports she was not aware that the patient was not able to return to [facility]. She says she is going to call the owner, Mr. Humphrey as to what her next move should be. Writer informed Deidra that patient has been recommended for inpatient admission and we are currently waiting for bed availability. Deidra verbalized understanding.

## 2022-08-16 NOTE — ED Notes (Signed)
Hospital meal provided.  100% consumed, pt tolerated w/o complaints.  Waste discarded appropriately.   

## 2022-08-16 NOTE — ED Notes (Signed)
Patient is lying in bed watching tv. All night time meds taken with no issues.

## 2022-08-17 DIAGNOSIS — F209 Schizophrenia, unspecified: Secondary | ICD-10-CM | POA: Diagnosis not present

## 2022-08-17 NOTE — ED Notes (Signed)
Snack and drink given. PT consumed 100% 
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
## 2022-08-17 NOTE — ED Notes (Signed)
Hospital meal provided.  100% consumed, pt tolerated w/o complaints.  Waste discarded appropriately.   

## 2022-08-17 NOTE — ED Notes (Signed)
Pt given dinner tray and beverage  
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
## 2022-08-17 NOTE — ED Notes (Signed)
Refused shower. 

## 2022-08-18 DIAGNOSIS — F209 Schizophrenia, unspecified: Secondary | ICD-10-CM | POA: Diagnosis not present

## 2022-08-18 NOTE — ED Notes (Signed)
Pt is sleeping currently, breakfast placed at bedside

## 2022-08-18 NOTE — ED Notes (Signed)
Pt given [place] sandwich tray with soda - eating at this time.

## 2022-08-18 NOTE — ED Notes (Signed)
Pt trash disposed, ate 100% of meal

## 2022-08-18 NOTE — ED Notes (Signed)
ivc/pending toc placement.. 
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
## 2022-08-19 DIAGNOSIS — F209 Schizophrenia, unspecified: Secondary | ICD-10-CM | POA: Diagnosis not present

## 2022-08-19 MED ORDER — ACETAMINOPHEN 500 MG PO TABS
1000.0000 mg | ORAL_TABLET | Freq: Once | ORAL | Status: AC
  Administered 2022-08-19: 1000 mg via ORAL
  Filled 2022-08-19: qty 2

## 2022-08-19 NOTE — ED Notes (Signed)
Pt received dinner tray. No complaints at this time.

## 2022-08-19 NOTE — ED Notes (Addendum)
Pt provided a snack and beverage. Pt asked for sandwich tray, nurse allowed.

## 2022-08-19 NOTE — BH Assessment (Deleted)
Per [facility], patient to be referred out of system.  Referral information for Psychiatric Hospitalization faxed to;   [facility] [phone]- [phone])  [facility] [phone]- [phone]),   [facility] [phone], [phone], [phone] or [phone]),   [place] [phone]--- [phone]--- [phone]--- [phone])  [address] [phone]),   [facility] [phone] -or- [phone]),   [facility] [phone])  [facility] [phone])
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
## 2022-08-19 NOTE — ED Provider Notes (Signed)
Emergency Medicine Observation Re-evaluation Note  Brian Stafford is a 47 y.o. male, seen on rounds today.  Pt initially presented to the ED for complaints of IVC   Physical Exam  BP 107/65 (BP Location: Right Arm)   Pulse 88   Temp 98.4 F (36.9 C) (Oral)   Resp 18   Ht 6\' 3"  (1.905 m)   Wt 93 kg   SpO2 97%   BMI 25.62 kg/m  Physical Exam General: NAd  ED Course / MDM  EKG:   I have reviewed the labs performed to date as well as medications administered while in observation.  Recent changes in the last 24 hours include .  Plan  Current plan is for psych.soc    Willy Eddy, MD 08/19/22 [phone]

## 2022-08-20 DIAGNOSIS — F209 Schizophrenia, unspecified: Secondary | ICD-10-CM | POA: Diagnosis not present

## 2022-08-20 NOTE — ED Notes (Signed)
Pt given dinner tray and beverage  

## 2022-08-20 NOTE — ED Notes (Signed)
Pt is sleeping

## 2022-08-20 NOTE — ED Notes (Signed)
pt recieved snack and drink 

## 2022-08-20 NOTE — ED Notes (Signed)
Gave pt a sprite while he waits for dinner tray.

## 2022-08-20 NOTE — ED Notes (Signed)
IVC, PT IS CURRENTLY UNDER IVC. PT'S IVC WILL EXPIRE ON  08/20/2022 @8 :45 P.M. IF NOT RENEWED.

## 2022-08-20 NOTE — NC FL2 (Signed)
[place] MEDICAID FL2 LEVEL OF CARE FORM     IDENTIFICATION  Patient Name: Brian Stafford Birthdate: December 23, 1975 Sex: male Admission Date (Current Location): 08/14/2022  [place] and [place] Number:  Chiropodist and Address:  [facility], [address], [place], [place] [address]      Provider Number: [id number]  Attending Physician Name and Address:  No att. 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   Aggression 09/08/2019    Orientation RESPIRATION BLADDER Height & Weight     Self, Time, Situation, Place  Normal Continent Weight: 205 lb (93 kg) Height:  6\' 3"  (190.5 cm)  BEHAVIORAL SYMPTOMS/MOOD NEUROLOGICAL BOWEL NUTRITION STATUS      Continent Diet (regular)  AMBULATORY STATUS COMMUNICATION OF NEEDS Skin     Verbally Normal                       Personal Care Assistance Level of Assistance              Functional Limitations Info             SPECIAL CARE FACTORS FREQUENCY                       Contractures      Additional Factors Info  Code Status, Allergies   Allergies Info: nka           Current Medications (08/20/2022):  This is the current hospital active medication list Current Facility-Administered Medications  Medication Dose Route Frequency Provider Last Rate Last Admin   benztropine (COGENTIN) tablet 1 mg  1 mg Oral Daily Shaune Pollack, Jamison Y, NP   1 mg at 08/20/22 1001   cloZAPine (CLOZARIL) tablet 200 mg  200 mg Oral BID Charm Rings, NP   200 mg at 08/20/22 1001   divalproex (DEPAKOTE) DR tablet 500 mg  500 mg Oral Q12H Charm Rings, NP   500 mg at 08/20/22 1001   fenofibrate tablet 54 mg  54 mg Oral Daily Charm Rings, NP   54 mg at 08/20/22 1002   multivitamin with minerals tablet 1 tablet  1 tablet Oral Daily Charm Rings, NP   1 tablet at 08/20/22 1001   OLANZapine (ZYPREXA) tablet 10 mg  10 mg Oral QHS Charm Rings, NP   10 mg at 08/19/22 2150   propranolol ER (INDERAL LA) 24 hr capsule 60 mg  60 mg Oral Daily Charm Rings, NP   60 mg at 08/20/22 1002   traZODone (DESYREL) tablet 50 mg  50 mg Oral QHS Charm Rings, NP   50 mg at 08/19/22 2150   Current Outpatient Medications  Medication Sig Dispense Refill   acetaminophen (TYLENOL) 325 MG tablet Take 650 mg by mouth every 4 (four) hours as needed.     benztropine (COGENTIN) 1 MG tablet Take 1 mg by mouth daily.     cloZAPine (CLOZARIL) 100 MG tablet Take 100-300 mg by mouth in the morning and at bedtime. 300mg  qam and 100mg  qpm  fenofibrate (TRICOR) 48 MG tablet Take 48 mg by mouth daily.     Multiple Vitamin (MULTIVITAMIN WITH MINERALS) TABS Take 1 tablet by mouth daily.     propranolol ER (INDERAL LA) 60 MG 24 hr capsule Take 1 capsule (60 mg total) by mouth daily. (Patient not taking: Reported on 08/14/2022) 30 capsule 0   traZODone (DESYREL) 50 MG tablet Take 50 mg by mouth at bedtime. (Patient not taking: Reported on 08/14/2022)       Discharge Medications: Please see discharge summary for a list of discharge medications.  Relevant Imaging Results:  Relevant Lab Results:   Additional Information SS #: [id number]  [name], LCSW

## 2022-08-20 NOTE — TOC Initial Note (Addendum)
Transition of Care [facility]) - Initial/Assessment Note    Patient Details  Name: Brian Stafford MRN: [id number] Date of Birth: Dec 16, 1975  [facility] of [facility]) CM/SW Contact:    Brian Cline, LCSW Phone Number: 08/20/2022, 10:11 AM  Clinical Narrative:                 Spoke to POA Brian Stafford @ [phone]. Brian [place] states patient is from [facility], she has spoken with Mr. [facility] [phone]) and he told her patient can come back if he has had med changes, is stable and no combative/aggressive behaviors. Brian Stafford states the group home will need to provide transportation at DC. Brian Stafford states patient is followed by [facility] and she has reached out to them about seeing who his Care Coordinator is but has not heard back yet.  CSW called Brian Stafford. He states patient can return to the group home, but it would have to be on Monday. Mr. Brian Stafford states they can arrange to pick patient up on Monday - weekday TOC will just need to call him to arrange this. Mr. Brian Stafford requested a FL2 and progress notes be emailed to him today at [email or web address]. He states an updated FL2 will need to be sent on Monday at time of DC with a list of the DC medications. Updated POA Diedra, MD, RN, and TOC handoff. Emailed info to Mr. Humphries by encrypted email.     Expected Discharge Plan: Group Home Barriers to Discharge: Continued Medical Work up   Patient Goals and CMS Choice   CMS Medicare.gov Compare Post Acute Care list provided to:: Patient Represenative (must comment) Choice offered to / list presented to : [facility] POA / Guardian      Expected Discharge Plan and Services       Living arrangements for the past 2 months: Group Home                                      Prior Living Arrangements/Services Living arrangements for the past 2 months: Group Home Lives with:: Facility Resident Patient language and need for interpreter reviewed:: Yes Do  you feel safe going back to the place where you live?: Yes            Criminal Activity/Legal Involvement Pertinent to Current Situation/Hospitalization: No - Comment as needed  Activities of Daily Living      Permission Sought/Granted Permission sought to share information with : Facility Industrial/product designer granted to share information with : Yes, Designer, fashion/clothing (by [employer])     Permission granted to share info w AGENCY: [facility]        Emotional Assessment         Alcohol / Substance Use: Not Applicable Psych Involvement: No (comment)  Admission diagnosis:  IVC Patient Active Problem List   Diagnosis Date Noted   Seizure (HCC) 12/06/2019   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 [place] [address] Phone: [phone] Fax: [phone]  [facility] - [place], [place] - [address] [place] [address] Phone: [phone] Fax: [phone]     Social Determinants of Health (SDOH) Social History: SDOH Screenings   Tobacco Use: Unknown (08/14/2022)   SDOH Interventions:  Readmission Risk Interventions    12/10/2019   11:51 AM  Readmission Risk Prevention Plan  Transportation Screening Complete  PCP or Specialist Appt within 5-7 Days Complete  Home Care Screening Complete  Medication Review (RN CM) Complete

## 2022-08-20 NOTE — ED Notes (Signed)
This tech called dietary because pt never got a dinner tray. Dietary said they would send one up.

## 2022-08-21 DIAGNOSIS — F209 Schizophrenia, unspecified: Secondary | ICD-10-CM | POA: Diagnosis not present

## 2022-08-21 NOTE — ED Notes (Signed)
Pt given lunch tray.

## 2022-08-21 NOTE — ED Provider Notes (Signed)
Emergency Medicine Observation Re-evaluation Note  Brian Stafford is a 47 y.o. male, seen on rounds today.  Pt initially presented to the ED for complaints of IVC Currently, the patient is resting comfortably.  Physical Exam  BP 116/73 (BP Location: Right Arm)   Pulse 95   Temp 98.3 F (36.8 C) (Oral)   Resp 17   Ht 6\' 3"  (1.905 m)   Wt 93 kg   SpO2 98%   BMI 25.62 kg/m  Physical Exam Patient appears well, no acute distress, normal WOB    ED Course / MDM  EKG:   I have reviewed the labs performed to date as well as medications administered while in observation.  Recent changes in the last 24 hours include none.  Plan  Current plan is for dispo per social work.    Chesley Noon, MD 08/21/22 [phone]

## 2022-08-21 NOTE — ED Notes (Signed)
Dinner tray and beverage given to pt  

## 2022-08-21 NOTE — ED Notes (Signed)
Pt provided with pm snack.

## 2022-08-21 NOTE — ED Notes (Signed)
Pt continues to sleep; will obtain VS when pt is awake.

## 2022-08-21 NOTE — ED Notes (Signed)
Vol  TOC  placement

## 2022-08-21 NOTE — ED Notes (Signed)
Pt given breakfast tray

## 2022-08-22 DIAGNOSIS — F209 Schizophrenia, unspecified: Secondary | ICD-10-CM | POA: Diagnosis not present

## 2022-08-22 LAB — CBC WITH DIFFERENTIAL/PLATELET
Abs Immature Granulocytes: 0.03 10*3/uL (ref 0.00–0.07)
Basophils Absolute: 0 10*3/uL (ref 0.0–0.1)
Basophils Relative: 0 %
Eosinophils Absolute: 0 10*3/uL (ref 0.0–0.5)
Eosinophils Relative: 0 %
HCT: 40 % (ref 39.0–52.0)
Hemoglobin: 13.1 g/dL (ref 13.0–17.0)
Immature Granulocytes: 1 %
Lymphocytes Relative: 37 %
Lymphs Abs: 2.3 10*3/uL (ref 0.7–4.0)
MCH: 27.3 pg (ref 26.0–34.0)
MCHC: 32.8 g/dL (ref 30.0–36.0)
MCV: 83.3 fL (ref 80.0–100.0)
Monocytes Absolute: 0.5 10*3/uL (ref 0.1–1.0)
Monocytes Relative: 8 %
Neutro Abs: 3.4 10*3/uL (ref 1.7–7.7)
Neutrophils Relative %: 54 %
Platelets: 285 10*3/uL (ref 150–400)
RBC: 4.8 MIL/uL (ref 4.22–5.81)
RDW: 13 % (ref 11.5–15.5)
WBC: 6.2 10*3/uL (ref 4.0–10.5)
nRBC: 0 % (ref 0.0–0.2)

## 2022-08-22 MED ORDER — ACETAMINOPHEN 325 MG PO TABS
650.0000 mg | ORAL_TABLET | Freq: Once | ORAL | Status: AC
  Administered 2022-08-22: 650 mg via ORAL
  Filled 2022-08-22: qty 2

## 2022-08-22 NOTE — ED Notes (Signed)
Pt requesting something for pain in left arm. MD notified.

## 2022-08-22 NOTE — ED Notes (Signed)
Pt given breakfast tray and drink 

## 2022-08-22 NOTE — TOC Progression Note (Signed)
Transition of Care [facility] - Progression Note    Patient Details  Name: MOUSA PROUT MRN: [id number] Date of Birth: 1975-12-19  [facility]) CM/SW Contact  Kreg Shropshire, RN Phone Number: 08/22/2022, 8:43 AM  Clinical Narrative:    Cm called Mr. Humphries owner of [facility] to asked if he can still take pt back today with updated FL2 and medication list.   Mr. Teodora Medici stated that to Fax updated FL2 to [phone]. He also requested updated medication list as well. He can come back to group home today. Cm will update provider and nursing staff.  Expected Discharge Plan: Group Home Barriers to Discharge: Continued Medical Work up  Expected Discharge Plan and Services       Living arrangements for the past 2 months: Group Home                                       Social Determinants of Health (SDOH) Interventions SDOH Screenings   Tobacco Use: Unknown (08/14/2022)    Readmission Risk Interventions    12/10/2019   11:51 AM  Readmission Risk Prevention Plan  Transportation Screening Complete  PCP or Specialist Appt within 5-7 Days Complete  Home Care Screening Complete  Medication Review (RN CM) Complete

## 2022-08-22 NOTE — NC FL2 (Signed)
[place] MEDICAID FL2 LEVEL OF CARE FORM     IDENTIFICATION  Patient Name: Brian Stafford Birthdate: 1975/11/06 Sex: male Admission Date (Current Location): 08/14/2022  [place] and [place] Number:  Chiropodist and Address:  [facility], [address], [place], [place] [address]      Provider Number: [id number]  Attending Physician Name and Address:  Dr. 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   Aggression 09/08/2019    Orientation RESPIRATION BLADDER Height & Weight     Self, Time, Situation, Place  Normal Continent Weight: 93 kg Height:  6\' 3"  (190.5 cm)  BEHAVIORAL SYMPTOMS/MOOD NEUROLOGICAL BOWEL NUTRITION STATUS      Continent Diet (see d/c summary)  AMBULATORY STATUS COMMUNICATION OF NEEDS Skin   Independent Verbally Normal                       Personal Care Assistance Level of Assistance  Bathing, Feeding, Dressing Bathing Assistance: Independent Feeding assistance: Independent Dressing Assistance: Independent Total Care Assistance: Independent   Functional Limitations Info             SPECIAL CARE FACTORS FREQUENCY                       Contractures      Additional Factors Info  Code Status, Allergies   Allergies Info: nka           Current Medications (08/22/2022):  This is the current hospital active medication list Current Facility-Administered Medications  Medication Dose Route Frequency Provider Last Rate Last Admin   benztropine (COGENTIN) tablet 1 mg  1 mg Oral Daily Charm Rings, NP   1 mg at  08/22/22 0916   cloZAPine (CLOZARIL) tablet 200 mg  200 mg Oral BID Charm Rings, NP   200 mg at 08/22/22 0916   divalproex (DEPAKOTE) DR tablet 500 mg  500 mg Oral Q12H Charm Rings, NP   500 mg at 08/22/22 5621   fenofibrate tablet 54 mg  54 mg Oral Daily Charm Rings, NP   54 mg at 08/22/22 3086   multivitamin with minerals tablet 1 tablet  1 tablet Oral Daily Charm Rings, NP   1 tablet at 08/22/22 0916   OLANZapine (ZYPREXA) tablet 10 mg  10 mg Oral QHS Charm Rings, NP   10 mg at 08/21/22 2105   propranolol ER (INDERAL LA) 24 hr capsule 60 mg  60 mg Oral Daily Charm Rings, NP   60 mg at 08/22/22 0916   traZODone (DESYREL) tablet 50 mg  50 mg Oral QHS Charm Rings, NP   50 mg at 08/21/22 2104   Current Outpatient Medications  Medication Sig Dispense Refill   acetaminophen (TYLENOL) 325 MG tablet Take 650 mg by mouth every 4 (four) hours as needed.     benztropine (COGENTIN) 1 MG tablet Take 1 mg by mouth daily.     cloZAPine (CLOZARIL) 100 MG tablet Take 100-300 mg by mouth in the morning and at bedtime. 300mg  qam and  100mg  qpm     fenofibrate (TRICOR) 48 MG tablet Take 48 mg by mouth daily.     Multiple Vitamin (MULTIVITAMIN WITH MINERALS) TABS Take 1 tablet by mouth daily.     propranolol ER (INDERAL LA) 60 MG 24 hr capsule Take 1 capsule (60 mg total) by mouth daily. (Patient not taking: Reported on 08/14/2022) 30 capsule 0   traZODone (DESYREL) 50 MG tablet Take 50 mg by mouth at bedtime. (Patient not taking: Reported on 08/14/2022)       Discharge Medications: Please see discharge summary for a list of discharge medications.  Relevant Imaging Results:  Relevant Lab Results:   Additional Information [id number]  Kreg Shropshire, RN

## 2022-08-22 NOTE — ED Provider Notes (Signed)
Emergency Medicine Observation Re-evaluation Note  Brian Stafford is a 47 y.o. male, seen on rounds today.  Pt initially presented to the ED for complaints of IVC Currently, the patient is none.  Physical Exam  BP 126/76 (BP Location: Right Arm)   Pulse 90   Temp 98.4 F (36.9 C) (Oral)   Resp 19   Ht 6\' 3"  (1.905 m)   Wt 93 kg   SpO2 100%   BMI 25.62 kg/m  Physical Exam Patient appears well, no acute distress, normal WOB    ED Course / MDM  EKG:   I have reviewed the labs performed to date as well as medications administered while in observation.  Recent changes in the last 24 hours include none.  Plan  Current plan is for dispo per social work.    Chesley Noon, MD 08/22/22 [phone]

## 2022-09-15 ENCOUNTER — Ambulatory Visit (INDEPENDENT_AMBULATORY_CARE_PROVIDER_SITE_OTHER): Payer: Medicare HMO | Admitting: Podiatry

## 2022-09-15 DIAGNOSIS — Z91199 Patient's noncompliance with other medical treatment and regimen due to unspecified reason: Secondary | ICD-10-CM

## 2022-09-15 NOTE — Addendum Note (Signed)
Addended by: Freddie Breech on: 09/15/2022 11:35 AM   Modules accepted: Level of Service

## 2022-09-15 NOTE — Progress Notes (Signed)
1. No-show for appointment     

## 2023-03-16 ENCOUNTER — Encounter: Payer: Self-pay | Admitting: Podiatry

## 2023-03-16 ENCOUNTER — Ambulatory Visit (INDEPENDENT_AMBULATORY_CARE_PROVIDER_SITE_OTHER): Payer: Medicare HMO | Admitting: Podiatry

## 2023-03-16 DIAGNOSIS — B351 Tinea unguium: Secondary | ICD-10-CM

## 2023-03-16 DIAGNOSIS — M79674 Pain in right toe(s): Secondary | ICD-10-CM | POA: Diagnosis not present

## 2023-03-16 DIAGNOSIS — M79675 Pain in left toe(s): Secondary | ICD-10-CM

## 2023-03-16 NOTE — Progress Notes (Signed)
  Subjective:  Patient ID: Brian Stafford, male    DOB: 03-Nov-1975,  MRN: [id number]  48 y.o. male presents painful, elongated thickened toenails x 10 which are symptomatic when wearing enclosed shoe gear. This interferes with his/her daily activities. He is a resident of [facility]. Chief Complaint  Patient presents with   Nail Problem    Patient is here for routine foot care    New problem(s): None   PCP is Fleet Contras, MD.  No Known Allergies  Review of Systems: Negative except as noted in the HPI.   Objective:  Brian Stafford is a pleasant 48 y.o. male WD, WN in NAD. AAO x 3.  Vascular Examination: Vascular status intact b/l with palpable pedal pulses. CFT immediate b/l. Pedal hair present. No edema. No pain with calf compression b/l. Skin temperature gradient WNL b/l. No varicosities noted. No cyanosis or clubbing noted.  Neurological Examination: Sensation grossly intact b/l with 10 gram monofilament. Vibratory sensation intact b/l.  Dermatological Examination: Pedal skin with normal turgor, texture and tone b/l. No open wounds nor interdigital macerations noted. Toenails 1-5 b/l thick, discolored, elongated with subungual debris and pain on dorsal palpation. No hyperkeratotic lesions noted b/l.   Musculoskeletal Examination: Muscle strength 5/5 to b/l LE.  No pain, crepitus noted b/l. No gross pedal deformities. Patient ambulates independently without assistive aids.   Radiographs: None  Last A1c:       No data to display           Assessment:   1. Pain due to onychomycosis of toenails of both feet    Plan:  Patient was evaluated and treated. All patient's and/or POA's questions/concerns addressed on today's visit. Mycotic toenails 1-5 debrided in length and girth without incident. Continue soft, supportive shoe gear daily. Report any pedal injuries to medical professional. Call office if there are any quesitons/concerns. -Patient/POA to  call should there be question/concern in the interim.  Return in about 3 months (around 06/13/2023).  Freddie Breech, DPM      [place] LOCATION: [address] [place] [address]                   Office [phone]   [facility] LOCATION: [address] [place], [place] [address] Office [phone]

## 2023-09-08 ENCOUNTER — Ambulatory Visit: Admitting: Podiatry

## 2023-10-23 ENCOUNTER — Ambulatory Visit (INDEPENDENT_AMBULATORY_CARE_PROVIDER_SITE_OTHER): Admitting: Podiatry

## 2023-10-23 DIAGNOSIS — Z91199 Patient's noncompliance with other medical treatment and regimen due to unspecified reason: Secondary | ICD-10-CM

## 2023-10-23 NOTE — Progress Notes (Signed)
 1. No-show for appointment    No show #2.

## 2024-01-29 ENCOUNTER — Emergency Department (HOSPITAL_COMMUNITY)

## 2024-01-29 ENCOUNTER — Emergency Department (HOSPITAL_COMMUNITY)
Admission: EM | Admit: 2024-01-29 | Discharge: 2024-01-29 | Source: Skilled Nursing Facility | Attending: Emergency Medicine | Admitting: Emergency Medicine

## 2024-01-29 ENCOUNTER — Other Ambulatory Visit: Payer: Self-pay

## 2024-01-29 ENCOUNTER — Encounter (HOSPITAL_COMMUNITY): Payer: Self-pay

## 2024-01-29 DIAGNOSIS — R531 Weakness: Secondary | ICD-10-CM | POA: Insufficient documentation

## 2024-01-29 DIAGNOSIS — R55 Syncope and collapse: Secondary | ICD-10-CM | POA: Diagnosis present

## 2024-01-29 LAB — COMPREHENSIVE METABOLIC PANEL WITH GFR
ALT: 7 U/L (ref 0–44)
AST: 11 U/L — ABNORMAL LOW (ref 15–41)
Albumin: 4.3 g/dL (ref 3.5–5.0)
Alkaline Phosphatase: 80 U/L (ref 38–126)
Anion gap: 10 (ref 5–15)
BUN: 12 mg/dL (ref 6–20)
CO2: 26 mmol/L (ref 22–32)
Calcium: 9.1 mg/dL (ref 8.9–10.3)
Chloride: 103 mmol/L (ref 98–111)
Creatinine, Ser: 1.25 mg/dL — ABNORMAL HIGH (ref 0.61–1.24)
GFR, Estimated: >60 mL/min
Glucose, Bld: 105 mg/dL — ABNORMAL HIGH (ref 70–99)
Potassium: 3.8 mmol/L (ref 3.5–5.1)
Sodium: 139 mmol/L (ref 135–145)
Total Bilirubin: 0.2 mg/dL (ref 0.0–1.2)
Total Protein: 7.4 g/dL (ref 6.5–8.1)

## 2024-01-29 LAB — CBC WITH DIFFERENTIAL/PLATELET
Abs Immature Granulocytes: 0.02 K/uL (ref 0.00–0.07)
Basophils Absolute: 0 K/uL (ref 0.0–0.1)
Basophils Relative: 0 %
Eosinophils Absolute: 0 K/uL (ref 0.0–0.5)
Eosinophils Relative: 0 %
HCT: 35.9 % — ABNORMAL LOW (ref 39.0–52.0)
Hemoglobin: 11.7 g/dL — ABNORMAL LOW (ref 13.0–17.0)
Immature Granulocytes: 0 %
Lymphocytes Relative: 36 %
Lymphs Abs: 1.8 K/uL (ref 0.7–4.0)
MCH: 26.7 pg (ref 26.0–34.0)
MCHC: 32.6 g/dL (ref 30.0–36.0)
MCV: 82 fL (ref 80.0–100.0)
Monocytes Absolute: 0.6 K/uL (ref 0.1–1.0)
Monocytes Relative: 12 %
Neutro Abs: 2.6 K/uL (ref 1.7–7.7)
Neutrophils Relative %: 52 %
Platelets: 263 K/uL (ref 150–400)
RBC: 4.38 MIL/uL (ref 4.22–5.81)
RDW: 13.1 % (ref 11.5–15.5)
Smear Review: NORMAL
WBC: 5.1 K/uL (ref 4.0–10.5)
nRBC: 0 % (ref 0.0–0.2)

## 2024-01-29 LAB — TROPONIN T, HIGH SENSITIVITY: Troponin T High Sensitivity: <15 ng/L (ref 0–19)

## 2024-01-29 LAB — MAGNESIUM: Magnesium: 2.2 mg/dL (ref 1.7–2.4)

## 2024-01-29 LAB — CBG MONITORING, ED: Glucose-Capillary: 122 mg/dL — ABNORMAL HIGH (ref 70–99)

## 2024-01-29 MED ORDER — LACTATED RINGERS IV BOLUS
1000.0000 mL | Freq: Once | INTRAVENOUS | Status: AC
  Administered 2024-01-29: 1000 mL via INTRAVENOUS

## 2024-01-29 NOTE — Discharge Instructions (Signed)
 Your test results today were reassuring.  Drink plenty of fluids to maintain good hydration.  Follow-up with your primary care doctor.  Return to the emergency department for any new or worsening symptoms of concern.

## 2024-01-29 NOTE — ED Triage Notes (Signed)
 Patient arrives via [facility] from a group home c/c dizziness and possible syncope with multiple falls. On EMS arrival, patient told EMS he did not feel he needed to be transported but facility insisted. VS WDL for orthostatics with EMS.

## 2024-01-29 NOTE — ED Notes (Signed)
 Patient transported to CT

## 2024-01-29 NOTE — ED Notes (Signed)
 Ambulated to the [facility] IND

## 2024-01-29 NOTE — ED Provider Notes (Signed)
 " [facility] AT [facility] Provider Note   CSN: [id number] Arrival date & time: 01/29/24  9163     Patient presents with: Near Syncope   Brian Stafford is a 48 y.o. male.    Near Syncope  Patient presents for near syncope.  Medical history includes schizophrenia, anxiety, bipolar disorder.  Yesterday, patient was in his normal state of health.  This morning, he was at his group home and went to get his medications.  When standing, he states that he just had a moment of weakness and fell to the ground.  This happened 2 times this morning.  Patient denies any areas of pain or suspected injury from the falls.  He denies syncope.  He denies any symptoms currently.  Patient declined transport by EMS but his group home insisted.     Prior to Admission medications  Medication Sig Start Date End Date Taking? Authorizing Provider  divalproex  (DEPAKOTE  ER) 500 MG 24 hr tablet Take 500 mg by mouth daily.   Yes [provider]  hydrOXYzine (ATARAX) 50 MG tablet Take 50 mg by mouth daily.   Yes [provider]  OLANZapine  (ZYPREXA ) 10 MG tablet Take 10 mg by mouth at bedtime.   Yes [provider]  acetaminophen  (TYLENOL ) 325 MG tablet Take 650 mg by mouth every 4 (four) hours as needed.    [provider]  benztropine  (COGENTIN ) 1 MG tablet Take 1 mg by mouth daily. 03/01/22   [provider]  cloZAPine  (CLOZARIL ) 100 MG tablet Take 100-300 mg by mouth in the morning and at bedtime. 300mg  qam and 100mg  qpm 02/28/20   [provider]  fenofibrate  (TRICOR ) 48 MG tablet Take 48 mg by mouth daily. 07/19/22   [provider]  Multiple Vitamin (MULTIVITAMIN WITH MINERALS) TABS Take 1 tablet by mouth daily.    [provider]  propranolol  ER (INDERAL  LA) 60 MG 24 hr capsule Take 1 capsule (60 mg total) by mouth daily. 12/14/19   Milissa Tod PARAS, MD  traZODone  (DESYREL ) 50 MG tablet Take 50 mg by mouth  at bedtime.    [provider]    Allergies: Patient has no known allergies.    Review of Systems  Cardiovascular:  Positive for near-syncope.  Neurological:  Positive for weakness (Generalized).  All other systems reviewed and are negative.   Updated Vital Signs BP 120/67   Pulse 94   Temp 97.8 F (36.6 C) (Oral)   Resp 16   Ht 6' 3 (1.905 m)   Wt 95.3 kg   SpO2 100%   BMI 26.25 kg/m   Physical Exam Vitals and nursing note reviewed.  Constitutional:      General: He is not in acute distress.    Appearance: Normal appearance. He is well-developed. He is not ill-appearing, toxic-appearing or diaphoretic.  HENT:     Head: Normocephalic and atraumatic.     Right Ear: External ear normal.     Left Ear: External ear normal.     Nose: Nose normal.     Mouth/Throat:     Mouth: Mucous membranes are moist.  Eyes:     Extraocular Movements: Extraocular movements intact.     Conjunctiva/sclera: Conjunctivae normal.  Cardiovascular:     Rate and Rhythm: Normal rate and regular rhythm.     Heart sounds: No murmur heard. Pulmonary:     Effort: Pulmonary effort is normal. No respiratory distress.     Breath sounds:  No wheezing or rales.  Chest:     Chest wall: No tenderness.  Abdominal:     General: There is no distension.     Palpations: Abdomen is soft.     Tenderness: There is no abdominal tenderness.  Musculoskeletal:        General: No swelling, tenderness, deformity or signs of injury. Normal range of motion.     Cervical back: Normal range of motion and neck supple.  Skin:    General: Skin is warm and dry.     Coloration: Skin is not jaundiced or pale.  Neurological:     General: No focal deficit present.     Mental Status: He is alert and oriented to person, place, and time.     Cranial Nerves: No cranial nerve deficit.     Sensory: No sensory deficit.     Motor: No weakness.     Coordination: Coordination normal.  Psychiatric:        Mood and  Affect: Mood normal.        Behavior: Behavior normal.     (all labs ordered are listed, but only abnormal results are displayed) Labs Reviewed  COMPREHENSIVE METABOLIC PANEL WITH GFR - Abnormal; Notable for the following components:      Result Value   Glucose, Bld 105 (*)    Creatinine, Ser 1.25 (*)    AST 11 (*)    All other components within normal limits  CBC WITH DIFFERENTIAL/PLATELET - Abnormal; Notable for the following components:   Hemoglobin 11.7 (*)    HCT 35.9 (*)    All other components within normal limits  MAGNESIUM  CBG MONITORING, ED  TROPONIN T, HIGH SENSITIVITY    EKG: EKG Interpretation Date/Time:  Monday January 29 2024 09:05:08 EST Ventricular Rate:  99 PR Interval:  140 QRS Duration:  83 QT Interval:  334 QTC Calculation: 429 R Axis:   72  Text Interpretation: Sinus rhythm Probable left atrial enlargement Borderline repolarization abnormality Confirmed by Melvenia Motto ([id number]) on 01/29/2024 9:57:12 AM  Radiology: CT HEAD WO CONTRAST Result Date: 01/29/2024 EXAM: CT HEAD WITHOUT CONTRAST 01/29/2024 10:07:45 AM TECHNIQUE: CT of the head was performed without the administration of intravenous contrast. Automated exposure control, iterative reconstruction, and/or weight based adjustment of the mA/kV was utilized to reduce the radiation dose to as low as reasonably achievable. COMPARISON: CT head configuration 620/24 and MRI brain 12/07/2019. CLINICAL HISTORY: Syncope/presyncope, cerebrovascular cause suspected. FINDINGS: BRAIN AND VENTRICLES: No acute hemorrhage. No evidence of acute territorial infarct. No hydrocephalus. No extra-axial collection. No mass effect or midline shift. There are overall mild similar scattered white matter hypodensities which are nonspecific but most commonly represent chronic microvascular ischemic changes. ORBITS: No acute abnormality. SINUSES: There is chronic left frontal, ethmoid and maxillary sinusitis. Mucosal thickening of the  right maxillary, ethmoid, and frontal sinuses. SOFT TISSUES AND SKULL: No acute soft tissue abnormality. No skull fracture. IMPRESSION: 1. No acute intracranial hemorrhage or acute territorial infarction. 2. Redemonstrated chronic left sinusitis. Mucosal thickening of the right paranasal sinuses. Electronically signed by: Prentice Spade MD 01/29/2024 10:49 AM EST RP Workstation: [id number]   DG Chest Port 1 View Result Date: 01/29/2024 EXAM: 1 VIEW(S) XRAY OF THE CHEST 01/29/2024 09:05:00 AM COMPARISON: 07/25/2022 CLINICAL HISTORY: Fall FINDINGS: LUNGS AND PLEURA: No focal pulmonary opacity. No pleural effusion. No pneumothorax. HEART AND MEDIASTINUM: No acute abnormality of the cardiac and mediastinal silhouettes. BONES AND SOFT TISSUES: No acute osseous abnormality. IMPRESSION: 1. No acute cardiopulmonary process.  Electronically signed by: Michaeline Blanch MD 01/29/2024 10:07 AM EST RP Workstation: [id number]     Procedures   Medications Ordered in the ED  lactated ringers bolus 1,000 mL (0 mLs Intravenous Stopped 01/29/24 1022)                                    Medical Decision Making Amount and/or Complexity of Data Reviewed Labs: ordered. Radiology: ordered.   This patient presents to the ED for concern of generalized weakness, this involves an extensive number of treatment options, and is a complaint that carries with it a high risk of complications and morbidity.  The differential diagnosis includes vasovagal episode, cataplexy, anemia, dehydration, arrhythmia, metabolic derangements   Co morbidities / Chronic conditions that complicate the patient evaluation  schizophrenia, anxiety, bipolar disorder   Additional history obtained:  Additional history obtained from EMR External records from outside source obtained and reviewed including N/A   Lab Tests:  I Ordered, and personally interpreted labs.  The pertinent results include: Normal hemoglobin, no leukocytosis, baseline  creatinine, normal electrolytes, normal troponin   Imaging Studies ordered:  I ordered imaging studies including chest x-ray, CT head I independently visualized and interpreted imaging which showed no acute findings I agree with the radiologist interpretation   Cardiac Monitoring: / EKG:  The patient was maintained on a cardiac monitor.  I personally viewed and interpreted the cardiac monitored which showed an underlying rhythm of: Sinus rhythm   Problem List / ED Course / Critical interventions / Medication management  Patient presents after 2 falls that occurred this morning at his group home.  He does remember the falls.  He denies loss of consciousness.  He states that, while standing, he had a momentary episode of weakness and this caused him to fall to the ground.  He denies any areas of suspected injury from the falls.  On arrival in the ED, he is well-appearing.  Vital signs are normal.  He has no areas of pain or tenderness.  He has no focal neurologic deficits.  His blood pressure is currently normal.  When stood up, he has no orthostatic hypotension.  CBG is 122.  Patient was placed on monitor.  Workup was initiated.  Results of workup were reassuring.  Patient remained in normal sinus rhythm while in the ED.  He remained asymptomatic.  Patient stable for discharge. I ordered medication including IV fluids for hydration Reevaluation of the patient after these medicines showed that the patient stayed the same I have reviewed the patients home medicines and have made adjustments as needed   Social Determinants of Health:  Resides in group home      Final diagnoses:  Near syncope    ED Discharge Orders     None          Melvenia Motto, MD 01/29/24 1120  "
